# Patient Record
Sex: Female | Born: 1950
Health system: Southern US, Community
[De-identification: ages and names within clinical notes are randomized; demographics above are authoritative.]

## PROBLEM LIST (undated history)

## (undated) DIAGNOSIS — E049 Nontoxic goiter, unspecified: Secondary | ICD-10-CM

## (undated) DIAGNOSIS — E785 Hyperlipidemia, unspecified: Secondary | ICD-10-CM

## (undated) DIAGNOSIS — M179 Osteoarthritis of knee, unspecified: Secondary | ICD-10-CM

## (undated) DIAGNOSIS — H409 Unspecified glaucoma: Secondary | ICD-10-CM

## (undated) DIAGNOSIS — E119 Type 2 diabetes mellitus without complications: Secondary | ICD-10-CM

## (undated) DIAGNOSIS — M1710 Unilateral primary osteoarthritis, unspecified knee: Secondary | ICD-10-CM

## (undated) DIAGNOSIS — I10 Essential (primary) hypertension: Secondary | ICD-10-CM

## (undated) DIAGNOSIS — D249 Benign neoplasm of unspecified breast: Secondary | ICD-10-CM

## (undated) DIAGNOSIS — R011 Cardiac murmur, unspecified: Secondary | ICD-10-CM

## (undated) DIAGNOSIS — E039 Hypothyroidism, unspecified: Secondary | ICD-10-CM

## (undated) HISTORY — DX: Cardiac murmur, unspecified: R01.1

## (undated) HISTORY — DX: Unspecified glaucoma: H40.9

## (undated) HISTORY — DX: Benign neoplasm of unspecified breast: D24.9

## (undated) HISTORY — DX: Unilateral primary osteoarthritis, unspecified knee: M17.10

## (undated) HISTORY — DX: Nontoxic goiter, unspecified: E04.9

## (undated) HISTORY — DX: Essential (primary) hypertension: I10

## (undated) HISTORY — DX: Osteoarthritis of knee, unspecified: M17.9

## (undated) HISTORY — DX: Type 2 diabetes mellitus without complications: E11.9

## (undated) HISTORY — PX: CATARACT EXTRACTION W/ INTRAOCULAR LENS  IMPLANT, BILATERAL: SHX1307

## (undated) HISTORY — DX: Hyperlipidemia, unspecified: E78.5

## (undated) HISTORY — DX: Hypothyroidism, unspecified: E03.9

## (undated) HISTORY — PX: ABDOMINOPLASTY: SUR9

---

## 1998-05-05 DIAGNOSIS — Z5189 Encounter for other specified aftercare: Secondary | ICD-10-CM

## 1998-05-05 HISTORY — DX: Encounter for other specified aftercare: Z51.89

## 1999-02-14 ENCOUNTER — Encounter: Payer: Self-pay | Admitting: Obstetrics and Gynecology

## 1999-02-14 ENCOUNTER — Ambulatory Visit (HOSPITAL_COMMUNITY): Admission: RE | Admit: 1999-02-14 | Discharge: 1999-02-14 | Payer: Self-pay | Admitting: Obstetrics and Gynecology

## 1999-05-06 HISTORY — PX: ABDOMINAL HYSTERECTOMY: SHX81

## 1999-05-28 ENCOUNTER — Inpatient Hospital Stay (HOSPITAL_COMMUNITY): Admission: RE | Admit: 1999-05-28 | Discharge: 1999-06-02 | Payer: Self-pay | Admitting: Obstetrics and Gynecology

## 1999-05-28 ENCOUNTER — Encounter (INDEPENDENT_AMBULATORY_CARE_PROVIDER_SITE_OTHER): Payer: Self-pay | Admitting: Specialist

## 2001-02-22 ENCOUNTER — Encounter: Payer: Self-pay | Admitting: Family Medicine

## 2001-02-22 ENCOUNTER — Encounter: Admission: RE | Admit: 2001-02-22 | Discharge: 2001-02-22 | Payer: Self-pay | Admitting: Family Medicine

## 2001-03-15 ENCOUNTER — Ambulatory Visit (HOSPITAL_COMMUNITY): Admission: RE | Admit: 2001-03-15 | Discharge: 2001-03-15 | Payer: Self-pay | Admitting: *Deleted

## 2001-03-15 ENCOUNTER — Encounter (INDEPENDENT_AMBULATORY_CARE_PROVIDER_SITE_OTHER): Payer: Self-pay | Admitting: *Deleted

## 2001-11-12 ENCOUNTER — Encounter: Payer: Self-pay | Admitting: Family Medicine

## 2001-11-12 ENCOUNTER — Encounter: Admission: RE | Admit: 2001-11-12 | Discharge: 2001-11-12 | Payer: Self-pay | Admitting: Family Medicine

## 2002-08-01 ENCOUNTER — Ambulatory Visit (HOSPITAL_COMMUNITY): Admission: RE | Admit: 2002-08-01 | Discharge: 2002-08-01 | Payer: Self-pay | Admitting: Gastroenterology

## 2002-09-03 DIAGNOSIS — M722 Plantar fascial fibromatosis: Secondary | ICD-10-CM

## 2002-09-03 HISTORY — DX: Plantar fascial fibromatosis: M72.2

## 2003-03-14 ENCOUNTER — Encounter: Admission: RE | Admit: 2003-03-14 | Discharge: 2003-06-12 | Payer: Self-pay | Admitting: Family Medicine

## 2006-11-02 ENCOUNTER — Other Ambulatory Visit: Admission: RE | Admit: 2006-11-02 | Discharge: 2006-11-02 | Payer: Self-pay | Admitting: Obstetrics and Gynecology

## 2007-11-09 ENCOUNTER — Other Ambulatory Visit: Admission: RE | Admit: 2007-11-09 | Discharge: 2007-11-09 | Payer: Self-pay | Admitting: Obstetrics and Gynecology

## 2010-05-05 DIAGNOSIS — H269 Unspecified cataract: Secondary | ICD-10-CM

## 2010-05-05 HISTORY — PX: EYE SURGERY: SHX253

## 2010-05-05 HISTORY — DX: Unspecified cataract: H26.9

## 2010-09-20 NOTE — Op Note (Signed)
[facility]  Patient:    Robin Rice                     MRN: [id number] Proc. Date: 05/28/99 Adm. Date:  [id number] Attending:  Dierdre Forth Pearline                           Operative Report  PREOPERATIVE DIAGNOSES:       Symptomatic uterine fibroids; history of anemia; hypertension.  POSTOPERATIVE DIAGNOSES:      Symptomatic uterine fibroids; history of anemia; hypertension.  OPERATION:                    Total abdominal hysterectomy.  SURGEON:                      Vanessa P. Pennie Rushing, M.D.  FIRST ASSISTANT:              Elmira J. Powell, P.A.C.  ANESTHESIA:                   General orotracheal.  ESTIMATED BLOOD LOSS:         350 cc.  COMPLICATIONS:                The patient presented a difficult intubation but subsequently was successfully intubated by the anesthesia department.  FINDINGS:                     The uterus was enlarged to approximately 14-weeks-size with multiple irregularities.  The tubes and ovaries appeared within normal limits.  DESCRIPTION OF PROCEDURE:     The patient was taken to the operating room after  appropriate identification and placed on the operating table.  After the attainment of adequate general anesthesia, the abdomen was prepped with multiple layers of  Betadine, as were the perineum and vagina.  A Foley catheter was inserted into he bladder and connected to straight drainage.  The abdomen was draped as a sterile field.  A transverse incision was made along the outlined lines for the patients subsequent plastic surgical procedure and the abdomen opened in layers.  The peritoneum was entered and washing obtained, which were subsequently discarded.  Examination of the upper abdomen was within normal limits.  The self-retaining OConnor-OSullivan retractor was placed in the abdominal incision and a bladder blade placed.  The bowel was packed cephalad and an upper blade placed.   The uterus was grasped at the cornual region on either side with Kelly clamps and elevated  into the operative field.  The left round ligament was suture-ligated and incised and that incision taken anteriorly on the anterior leaf of the broad ligament. The uteroovarian ligament was then clamped, cut, tied with a free tie and suture-ligated; a similar procedure was carried out on the opposite side with the incisions on the broad ligaments meeting anteriorly.  The bladder was bluntly dissected off the anterior cervix.  The uterine vessels were skeletonized, then  clamped, cut and suture-ligated on the right and left sides.  The uterine fundus was excised and removed from the operative field.  The paracervical tissues were then successively clamped, cut and suture-ligated and at the level of the uterosacral ligaments, these sutures were held.  The vaginal angles were then clamped, cut and suture-ligated and the sutures held.  The remainder of the cervix was excised from the upper vagina.  The vaginal  cuff was then closed with figure-of-eight sutures of 0 Vicryl.  Care was taken at the angle sutures to place Richardson stitches to ensure that the vaginal angle had been oversewn. Hemostasis was achieved with figure-of-eight sutures in the vaginal cuff.  Copious irrigation was carried out and the sutures which held the vaginal angles and the sutures which held the uterosacral ligaments on either side were tied together.  Hemostasis was noted to be adequate and all instruments were removed from the operative field.  The abdominal peritoneum was closed with a running suture of 0 Vicryl.  The rectus muscles were irrigated and made hemostatic with Bovie cautery.  The fascia was closed with a running suture of 0 Vicryl from each apex to the midline, then tied in the midline.  At this time, the total abdominal hysterectomy was considered complete and Dr. Pleas Patricia, Montez Hageman. began the  abdominoplasty; this will be dictated under a separate operative report. DD:  05/28/99 TD:  05/29/99 Job: [id number]

## 2010-09-20 NOTE — Op Note (Signed)
NAME:  Robin Rice, ZANI                       ACCOUNT NO.:  [id number]   MEDICAL RECORD NO.:  [id number]                   PATIENT TYPE:  AMB   LOCATION:  ENDO                                 FACILITY:  [facility]   PHYSICIAN:  Anselmo Rod, M.D.               DATE OF BIRTH:  10-04-1950   DATE OF PROCEDURE:  08/01/2002  DATE OF DISCHARGE:                                 OPERATIVE REPORT   PROCEDURE PERFORMED:  Screening colonoscopy.   ENDOSCOPIST:  Charna Elizabeth, M.D.   INSTRUMENT USED:  Pediatric adjustable Olympus colonoscope.   INDICATIONS FOR PROCEDURE:  The patient is a 60 year old African-American  female undergoing screening colonoscopy.  Rule out colonic polyps, masses,  etc.   PREPROCEDURE PREPARATION:  Informed consent was procured from the patient.  The patient was fasted for eight hours prior to the procedure and prepped  with a bottle of MiraLax and Gatorade the night prior to the procedure.   PREPROCEDURE PHYSICAL:  The patient had stable vital signs.  Neck supple.  Chest clear to auscultation.  S1 and S2 regular.  Abdomen soft with normal  bowel sounds.   DESCRIPTION OF PROCEDURE:  The patient was placed in left lateral decubitus  position and sedated with 100 mg of Demerol and 10 mg of Versed  intravenously.  Once the patient was adequately sedated and maintained on  low flow oxygen and continuous cardiac monitoring, the Olympus video  colonoscope was advanced from the rectum to the cecum with difficulty.  There was some residual stool in the colon.  Multiple washes were done.  The  patient's position was changed from the left lateral to the supine position.  The appendicular orifice and ileocecal valve were visualized and  photographed but the pictures were lost due to technical problems.  The rest  of the colonic mucosa including the right colon, transverse colon, rectum  appeared normal.  Small lesions could have been missed secondary to a  relatively poor  prep.  Small nonbleeding internal hemorrhoids were seen on  retroflexion in the rectum.   IMPRESSION:  1. Normal colonoscopy except for small nonbleeding internal hemorrhoids.  2. No masses or polyps seen.  3. Some residual stool in the colon, small lesions could have been missed.    RECOMMENDATIONS:  1. Repeat colorectal cancer screening is recommended for the patient in the     next five years unless the patient develops any abdominal symptoms in the     interim.  2. Outpatient follow-up on a p.r.n. basis.                                                   Anselmo Rod, M.D.    JNM/MEDQ  D:  08/01/2002  T:  08/01/2002  Job:  [id number]   cc:   Marjory Lies, M.D.  [address]  [place]  [place] [address]  Fax: [phone]

## 2010-09-20 NOTE — Op Note (Signed)
[facility]  Patient:    Robin Rice                     MRN: [id number] Proc. Date: 05/28/99 Adm. Date:  [id number] Attending:  Dierdre Forth Pearline                           Operative Report  PREOPERATIVE DIAGNOSIS:       Abdominal elastosis.  POSTOPERATIVE DIAGNOSIS:      Abdominal elastosis.  OPERATION:                    Abdominoplasty.  SURGEON:                      Consuello Bossier., M.D.  ASSISTANT:  ANESTHESIA:                   General endotracheal anesthesia.  ESTIMATED BLOOD LOSS:  FINDINGS:                     Please see Vanessa P. Haygood, M.D.s operative note for her abdominal hysterectomy.  I talked with the patient about what would be involved with an abdominoplasty, trying to remove as much of the lower abdominal doughy skin that was present.  She had been marked for the procedure and the ellipse that had been marked off was able to be removed.  DESCRIPTION OF PROCEDURE:     The patient was brought to the operating room having had a general endotracheal anesthetic and then her abdominal hysterectomy by Maris Berger. Pennie Rushing, M.D.  The fascia had been closed.  At this point, the dissection was continued at the level of the fascia with electrocautery unit up to the umbilicus and then a separate incision made around the umbilicus.  It should be mentioned that the previous incision which had been marked off was extended laterally on both side.  The dissection was continued then above the level of the umbilicus up toward the sternum.  Also dissection was continued upward inferiorly somewhat freeing up the lower flap.  Bleeding was controlled with electrocautery unit and there was noted to be good hemostasis.  There was a slight stasis recti superiorly, but it was not felt large enough to require repair.  At this point, the patient was placed in somewhat of a jackknife position and two key sutures  of interrupted 2-0 Vicryl were placed in the midline.  A Chevron shaped incision was then made in the abdominal flap overlying the umbilicus and the umbilicus brought into place and sewn with subcutaneous 4-0 Vicryl followed by interrupted 5-0 Prolene.  The large triangular segments of lower abdominal full-thickness tissue were then removed and again bleeding was controlled with electrocautery unit. wo 10 mm Blake drains were placed on each side of the umbilicus and brought out through separate stab wounds.  At this point the lower abdominal skin was closed with interrupted 3-0 Vicryl followed by a running subcuticular 4-0 Monocryl. Steri-Strips, Xeroform, 4 x 8, and a Hypafix dressing were then applied.  The patient tolerated the procedure well and was able to be discharged from the operating room to the recovery room in satisfactory condition. DD:  05/28/99 TD:  05/29/99 Job: [id number]

## 2010-09-20 NOTE — Discharge Summary (Signed)
[facility]  Patient:    Robin Rice                     MRN: [id number] Adm. Date:  [id number] Disc. Date: [id number] Attending:  Shaune Spittle Dictator:   Marquis Lunch. Powell, P.A.C.                           Discharge Summary  DISCHARGE DIAGNOSES:          1. Symptomatic uterine fibroids.                               2. History of anemia.                               3. Hypertension.  PROCEDURES:                   On the date of admission, patient underwent a total                               abdominal hysterectomy by Dr. Maris Berger. Haygood.  This procedure was followed by an abdominoplasty by                               Dr. Pleas Patricia, Montez Hageman., along with the placement                               of two Blake drains.  Prior to discharge, patient                               also underwent an evacuation of an abdominal wound                               hematoma following previous abdominal hysterectomy                               and abdominoplasty.  HISTORY OF PRESENT ILLNESS:   This is a 60 year old married African-American female, para 2-0-0-2, with a longstanding history of symptomatic uterine fibroids resulting in menorrhagia and subsequent anemia.  Please see patients dictated history and physical exam for details.  PHYSICAL EXAMINATION:         Blood pressure was 120/78.  Weight was 180-1/2 pounds.  General exam was within normal limits with the exception of abdominal xam which revealed a palpable mass arising from the pelvis in the midabdominal area  between umbilicus and symphysis pubis.  Pelvic exam:  EG/BUS within normal limits. Uterus 16- to 18-weeks-size.  Adnexa without tenderness or masses. Rectovaginal confirms.  HOSPITAL COURSE:              On date of admission, patient underwent a total abdominal hysterectomy and abdominoplasty, tolerating both procedures well. Patients preop hemoglobin  was 14 (postop hemoglobin 7).  Patients postoperative  course was complicated by a fever on postop day #2 which was managed by IV antibiotics.  Patient continued to remain febrile  on postop day #3, at which time she was taken again to the operating room by Dr. Stephens November for the evacuation f an abdominal hematoma.  Following this procedure, patients fever defervesced, she resumed bowel and bladder function and was felt to be ready for discharge on postop day #5.  DISCHARGE MEDICATIONS:        1. Tylox one tablet every six hours as needed for                                  pain.                               2. Ferrous sulfate 325 mg one tablet three times  day.  FOLLOWUP:                     Patient is to call [facility] and Gynecology for an appointment with Dr. Pennie Rushing in six weeks for postoperative evaluation; she also was encouraged to call for any questions or concerns. Patient has a followup appointment with Dr. Stephens November on June 03, 1999 for drain management.  DISCHARGE INSTRUCTIONS:       Patient given a copy of [facility] postoperative instruction sheet.  She was advised to avoid driving for two weeks, heavy lifting for four weeks, intercourse for six weeks. Patient is also to receive separate instructions from Dr. Stephens November.  FINAL PATHOLOGY:              1. Uterus with cervix:  Cervicitis and squamous                                  metaplasia.  No dysplasia identified.                                  Proliferative endometrium.  No evidence of                                  hyperplasia or malignancy identified. Multiple                                  leiomyomata, submucosal, intramural and                                  subserosal.  No malignancy identified.                               2. Abdominal tissue:  Benign skin and adipose tissue. DD:  06/17/99 TD:  06/18/99 Job: [id number]

## 2010-09-20 NOTE — Discharge Summary (Signed)
[facility]  Patient:    Robin Rice                     MRN: [id number] Adm. Date:  [id number] Disc. Date: [id number] Attending:  Shaune Spittle Dictator:   Henreitta Leber, P.A.                           Discharge Summary  ADMISSION DIAGNOSES:          1. Symptomatic uterine fibroids.                               2. History of anemia.                               3. Hypertension.  DISCHARGE DIAGNOSES:          1. Symptomatic uterine fibroids.                               2. History of anemia.                               3. Hypertension.  DISCHARGE INSTRUCTIONS:       The patient was given a copy of [facility] postoperative instruction sheet.  She was advised to avoid driving for two weeks, heavy lifting for four weeks, intercourse for six weeks.  DISCHARGE MEDICATIONS:        1. Tylox one tablet every six hours as needed for                                  pain.                               2. Ferrous sulfate 325 mg one tablet three times  day for eight weeks.  FOLLOW-UP:                    The patient is scheduled to call [facility] and [facility]necology and schedule a postoperative follow-up with Vanessa P. Haygood, M.D. in six weeks.  HOSPITAL COURSE:              On date of admission, the patient underwent a DD:  06/17/99 TD:  06/17/99 Job: [id number]

## 2010-10-22 ENCOUNTER — Other Ambulatory Visit: Payer: Self-pay

## 2012-12-02 ENCOUNTER — Ambulatory Visit: Payer: Self-pay | Admitting: Certified Nurse Midwife

## 2012-12-07 ENCOUNTER — Encounter: Payer: Self-pay | Admitting: Certified Nurse Midwife

## 2012-12-10 ENCOUNTER — Encounter: Payer: Self-pay | Admitting: Certified Nurse Midwife

## 2012-12-10 ENCOUNTER — Ambulatory Visit (INDEPENDENT_AMBULATORY_CARE_PROVIDER_SITE_OTHER): Payer: PRIVATE HEALTH INSURANCE | Admitting: Certified Nurse Midwife

## 2012-12-10 VITALS — BP 126/80 | HR 60 | Resp 16 | Ht 64.25 in | Wt 184.0 lb

## 2012-12-10 DIAGNOSIS — Z01419 Encounter for gynecological examination (general) (routine) without abnormal findings: Secondary | ICD-10-CM

## 2012-12-10 DIAGNOSIS — Z0000 Encounter for general adult medical examination without abnormal findings: Secondary | ICD-10-CM

## 2012-12-10 LAB — POCT URINALYSIS DIPSTICK
Blood, UA: NEGATIVE
Nitrite, UA: NEGATIVE
Protein, UA: NEGATIVE
Urobilinogen, UA: NEGATIVE

## 2012-12-10 NOTE — Patient Instructions (Signed)

## 2012-12-10 NOTE — Progress Notes (Signed)
62 y.o. Robin Rice Married African American Fe here for annual exam. Menopausal, no HRT. Denies vaginal bleeding or vaginal dryness. Sees PCP for thyroid, cholesterol and hypertension management, labs, aex.  No health issues today.  Patient's last menstrual period was 05/06/1999.          Sexually active: yes  The current method of family planning is status post hysterectomy.    Exercising: yes  walking Smoker:  no  Health Maintenance: Pap: 11/13/08 neg MMG:  7/14 normal Colonoscopy:  2004 BMD:   6/13 normal TDaP:  Unsure, done at pcp up to date Labs: Poct urine-neg Self breast exam: done occ   reports that she has never smoked. She does not have any smokeless tobacco history on file. She reports that she does not drink alcohol or use illicit drugs.  Past Medical History  Diagnosis Date  . Hypertension   . Hyperlipidemia   . Thyroid disease     hypothyroid, goiter  . Glaucoma     left eye  . Fibroadenoma     Past Surgical History  Procedure Laterality Date  . Abdominal hysterectomy  2001    TAH, menorrhagia  . Abdominoplasty    . Cataract surgery      Current Outpatient Prescriptions  Medication Sig Dispense Refill  . aspirin 81 MG tablet Take 81 mg by mouth daily.      Marland Kitchen atorvastatin (LIPITOR) 40 MG tablet Take 1/2 tablet daily      . ergocalciferol (VITAMIN D2) 50000 UNITS capsule Take 50,000 Units by mouth every 14 (fourteen) days.       Marland Kitchen levothyroxine (SYNTHROID, LEVOTHROID) 50 MCG tablet Take 50 mcg by mouth daily before breakfast.      . lisinopril-hydrochlorothiazide (PRINZIDE,ZESTORETIC) 20-25 MG per tablet Take 1 tablet by mouth daily.       No current facility-administered medications for this visit.    Family History  Problem Relation Age of Onset  . Heart disease Mother   . Hypertension Father   . Hypertension Brother   . Hypertension Brother     ROS:  Pertinent items are noted in HPI.  Otherwise, a comprehensive ROS was negative.  Exam:   BP  126/80  Pulse 60  Resp 16  Ht 5' 4.25" (1.632 m)  Wt 184 lb (83.462 kg)  BMI 31.34 kg/m2  LMP 05/06/1999 Height: 5' 4.25" (163.2 cm)  Ht Readings from Last 3 Encounters:  12/10/12 5' 4.25" (1.632 m)    General appearance: alert, cooperative and appears stated age Head: Normocephalic, without obvious abnormality, atraumatic Neck: no adenopathy, supple, symmetrical, trachea midline and thyroid normal to inspection and palpation Lungs: clear to auscultation bilaterally Breasts: normal appearance, no masses or tenderness, No nipple retraction or dimpling, No nipple discharge or bleeding, No axillary or supraclavicular adenopathy Heart: regular rate and rhythm Abdomen: soft, non-tender; no masses,  no organomegaly Extremities: extremities normal, atraumatic, no cyanosis or edema Skin: Skin color, texture, turgor normal. No rashes or lesions Lymph nodes: Cervical, supraclavicular, and axillary nodes normal. No abnormal inguinal nodes palpated Neurologic: Grossly normal   Pelvic: External genitalia:  no lesions              Urethra:  normal appearing urethra with no masses, tenderness or lesions              Bartholin's and Skene's: normal                 Vagina: normal appearing vagina with normal color and discharge,  no lesions              Cervix: absent              Pap taken: no Bimanual Exam:  Uterus:  uterus absent              Adnexa: no mass, fullness, tenderness               Rectovaginal: Confirms               Anus:  normal sphincter tone, no lesions  A:  Well Woman with normal exam  Menopausal no HRT S/PTAH due to bleeding ovaries retained  Hypothyroid, Cholesterol and Hypertension stable with medication PCP management   P:   Reviewed health and wellness pertinent to exam  Continue follow up as indicated  Pap smear as per guidelines   Mammogram yearly pap smear not taken today  counseled on breast self exam, mammography screening, menopause, adequate intake of  calcium and vitamin D, diet and exercise  return annually or prn  An After Visit Summary was printed and given to the patient.

## 2012-12-11 NOTE — Progress Notes (Signed)
Note reviewed, agree with plan.  Dezerae Freiberger, MD  

## 2013-03-29 ENCOUNTER — Ambulatory Visit (INDEPENDENT_AMBULATORY_CARE_PROVIDER_SITE_OTHER): Payer: PRIVATE HEALTH INSURANCE

## 2013-03-29 ENCOUNTER — Ambulatory Visit (INDEPENDENT_AMBULATORY_CARE_PROVIDER_SITE_OTHER): Payer: PRIVATE HEALTH INSURANCE | Admitting: Podiatry

## 2013-03-29 ENCOUNTER — Encounter: Payer: Self-pay | Admitting: Podiatry

## 2013-03-29 VITALS — BP 158/86 | HR 91 | Resp 16 | Ht 65.0 in | Wt 187.0 lb

## 2013-03-29 DIAGNOSIS — M79609 Pain in unspecified limb: Secondary | ICD-10-CM

## 2013-03-29 DIAGNOSIS — M722 Plantar fascial fibromatosis: Secondary | ICD-10-CM

## 2013-03-29 DIAGNOSIS — M79671 Pain in right foot: Secondary | ICD-10-CM

## 2013-03-29 MED ORDER — MELOXICAM 15 MG PO TABS: 15.0000 mg | ORAL_TABLET | Freq: Every day | ORAL | Status: DC

## 2013-03-29 NOTE — Progress Notes (Signed)
Ms. Bibian presents today as a 62 year old black female with a chief complaint of a right heel pain x6 months. She states that she's an avid walker and is having problems walking and has as of late not been walking at all. She states that she needs to walk control her weight and her blood sugar. He does have a history of non-insulin-dependent diabetes mellitus.  Objective: Vital signs are stable she is alert and oriented x3. I have reviewed her past medical history medications and allergies. Pulses are strongly palpable bilateral. Neurologic sensorium is intact per Semmes-Weinstein monofilament. Deep tendon reflexes are intact bilateral. Muscle strength 5 over 5 dorsiflexors plantar flexors inverters and everters all intrinsic musculature is intact. Orthopedic evaluation demonstrates all joints distal to the ankle have a full range of motion without pain. She has pain on palpation medial calcaneal tubercle of the right heel. Radiographic evaluation demonstrates soft tissue increase in density at the plantar fascial calcaneal insertion site of the right heel. This is consistent with plantar fasciitis. Cutaneous evaluation demonstrates supple well hydrated cutis no erythema edema saline is drainage or odor.  Assessment: Plantar fasciitis right.  Plan: First injection to her right heel today. Plantar fascial strapping applied. Night splint dispensed. Prescription for Mobic called in. Discussed appropriate shoe gear stretching exercises and ice therapy. Instructions were given. And I will followup with her in one month. We did discuss a possible need for orthotics.

## 2013-03-29 NOTE — Patient Instructions (Signed)
Plantar Fasciitis (Heel Spur Syndrome) with Rehab The plantar fascia is a fibrous, ligament-like, soft-tissue structure that spans the bottom of the foot. Plantar fasciitis is a condition that causes pain in the foot due to inflammation of the tissue. SYMPTOMS   Pain and tenderness on the underneath side of the foot.  Pain that worsens with standing or walking. CAUSES  Plantar fasciitis is caused by irritation and injury to the plantar fascia on the underneath side of the foot. Common mechanisms of injury include:  Direct trauma to bottom of the foot.  Damage to a small nerve that runs under the foot where the main fascia attaches to the heel bone.  Stress placed on the plantar fascia due to bone spurs. RISK INCREASES WITH:   Activities that place stress on the plantar fascia (running, jumping, pivoting, or cutting).  Poor strength and flexibility.  Improperly fitted shoes.  Tight calf muscles.  Flat feet.  Failure to warm-up properly before activity.  Obesity. PREVENTION  Warm up and stretch properly before activity.  Allow for adequate recovery between workouts.  Maintain physical fitness:  Strength, flexibility, and endurance.  Cardiovascular fitness.  Maintain a health body weight.  Avoid stress on the plantar fascia.  Wear properly fitted shoes, including arch supports for individuals who have flat feet. PROGNOSIS  If treated properly, then the symptoms of plantar fasciitis usually resolve without surgery. However, occasionally surgery is necessary. RELATED COMPLICATIONS   Recurrent symptoms that may result in a chronic condition.  Problems of the lower back that are caused by compensating for the injury, such as limping.  Pain or weakness of the foot during push-off following surgery.  Chronic inflammation, scarring, and partial or complete fascia tear, occurring more often from repeated injections. TREATMENT  Treatment initially involves the use of  ice and medication to help reduce pain and inflammation. The use of strengthening and stretching exercises may help reduce pain with activity, especially stretches of the Achilles tendon. These exercises may be performed at home or with a therapist. Your caregiver may recommend that you use heel cups of arch supports to help reduce stress on the plantar fascia. Occasionally, corticosteroid injections are given to reduce inflammation. If symptoms persist for greater than 6 months despite non-surgical (conservative), then surgery may be recommended.  MEDICATION   If pain medication is necessary, then nonsteroidal anti-inflammatory medications, such as aspirin and ibuprofen, or other minor pain relievers, such as acetaminophen, are often recommended.  Do not take pain medication within 7 days before surgery.  Prescription pain relievers may be given if deemed necessary by your caregiver. Use only as directed and only as much as you need.  Corticosteroid injections may be given by your caregiver. These injections should be reserved for the most serious cases, because they may only be given a certain number of times. HEAT AND COLD  Cold treatment (icing) relieves pain and reduces inflammation. Cold treatment should be applied for 10 to 15 minutes every 2 to 3 hours for inflammation and pain and immediately after any activity that aggravates your symptoms. Use ice packs or massage the area with a piece of ice (ice massage).  Heat treatment may be used prior to performing the stretching and strengthening activities prescribed by your caregiver, physical therapist, or athletic trainer. Use a heat pack or soak the injury in warm water. SEEK IMMEDIATE MEDICAL CARE IF:  Treatment seems to offer no benefit, or the condition worsens.  Any medications produce adverse side effects. EXERCISES RANGE   OF MOTION (ROM) AND STRETCHING EXERCISES - Plantar Fasciitis (Heel Spur Syndrome) These exercises may help you  when beginning to rehabilitate your injury. Your symptoms may resolve with or without further involvement from your physician, physical therapist or athletic trainer. While completing these exercises, remember:   Restoring tissue flexibility helps normal motion to return to the joints. This allows healthier, less painful movement and activity.  An effective stretch should be held for at least 30 seconds.  A stretch should never be painful. You should only feel a gentle lengthening or release in the stretched tissue. RANGE OF MOTION - Toe Extension, Flexion  Sit with your right / left leg crossed over your opposite knee.  Grasp your toes and gently pull them back toward the top of your foot. You should feel a stretch on the bottom of your toes and/or foot.  Hold this stretch for __________ seconds.  Now, gently pull your toes toward the bottom of your foot. You should feel a stretch on the top of your toes and or foot.  Hold this stretch for __________ seconds. Repeat __________ times. Complete this stretch __________ times per day.  RANGE OF MOTION - Ankle Dorsiflexion, Active Assisted  Remove shoes and sit on a chair that is preferably not on a carpeted surface.  Place right / left foot under knee. Extend your opposite leg for support.  Keeping your heel down, slide your right / left foot back toward the chair until you feel a stretch at your ankle or calf. If you do not feel a stretch, slide your bottom forward to the edge of the chair, while still keeping your heel down.  Hold this stretch for __________ seconds. Repeat __________ times. Complete this stretch __________ times per day.  STRETCH  Gastroc, Standing  Place hands on wall.  Extend right / left leg, keeping the front knee somewhat bent.  Slightly point your toes inward on your back foot.  Keeping your right / left heel on the floor and your knee straight, shift your weight toward the wall, not allowing your back to  arch.  You should feel a gentle stretch in the right / left calf. Hold this position for __________ seconds. Repeat __________ times. Complete this stretch __________ times per day. STRETCH  Soleus, Standing  Place hands on wall.  Extend right / left leg, keeping the other knee somewhat bent.  Slightly point your toes inward on your back foot.  Keep your right / left heel on the floor, bend your back knee, and slightly shift your weight over the back leg so that you feel a gentle stretch deep in your back calf.  Hold this position for __________ seconds. Repeat __________ times. Complete this stretch __________ times per day. STRETCH  Gastrocsoleus, Standing  Note: This exercise can place a lot of stress on your foot and ankle. Please complete this exercise only if specifically instructed by your caregiver.   Place the ball of your right / left foot on a step, keeping your other foot firmly on the same step.  Hold on to the wall or a rail for balance.  Slowly lift your other foot, allowing your body weight to press your heel down over the edge of the step.  You should feel a stretch in your right / left calf.  Hold this position for __________ seconds.  Repeat this exercise with a slight bend in your right / left knee. Repeat __________ times. Complete this stretch __________ times per day.    STRENGTHENING EXERCISES - Plantar Fasciitis (Heel Spur Syndrome)  These exercises may help you when beginning to rehabilitate your injury. They may resolve your symptoms with or without further involvement from your physician, physical therapist or athletic trainer. While completing these exercises, remember:   Muscles can gain both the endurance and the strength needed for everyday activities through controlled exercises.  Complete these exercises as instructed by your physician, physical therapist or athletic trainer. Progress the resistance and repetitions only as guided. STRENGTH - Towel  Curls  Sit in a chair positioned on a non-carpeted surface.  Place your foot on a towel, keeping your heel on the floor.  Pull the towel toward your heel by only curling your toes. Keep your heel on the floor.  If instructed by your physician, physical therapist or athletic trainer, add ____________________ at the end of the towel. Repeat __________ times. Complete this exercise __________ times per day. STRENGTH - Ankle Inversion  Secure one end of a rubber exercise band/tubing to a fixed object (table, pole). Loop the other end around your foot just before your toes.  Place your fists between your knees. This will focus your strengthening at your ankle.  Slowly, pull your big toe up and in, making sure the band/tubing is positioned to resist the entire motion.  Hold this position for __________ seconds.  Have your muscles resist the band/tubing as it slowly pulls your foot back to the starting position. Repeat __________ times. Complete this exercises __________ times per day.  Document Released: 04/21/2005 Document Revised: 07/14/2011 Document Reviewed: 08/03/2008 [employer] Patient Information 2014 [name]. Plantar Fasciitis Plantar fasciitis is a common condition that causes foot pain. It is soreness (inflammation) of the band of tough fibrous tissue on the bottom of the foot that runs from the heel bone (calcaneus) to the ball of the foot. The cause of this soreness may be from excessive standing, poor fitting shoes, running on hard surfaces, being overweight, having an abnormal walk, or overuse (this is common in runners) of the painful foot or feet. It is also common in aerobic exercise dancers and ballet dancers. SYMPTOMS  Most people with plantar fasciitis complain of:  Severe pain in the morning on the bottom of their foot especially when taking the first steps out of bed. This pain recedes after a few minutes of walking.  Severe pain is experienced also during walking  following a long period of inactivity.  Pain is worse when walking barefoot or up stairs DIAGNOSIS   Your caregiver will diagnose this condition by examining and feeling your foot.  Special tests such as X-rays of your foot, are usually not needed. PREVENTION   Consult a sports medicine professional before beginning a new exercise program.  Walking programs offer a good workout. With walking there is a lower chance of overuse injuries common to runners. There is less impact and less jarring of the joints.  Begin all new exercise programs slowly. If problems or pain develop, decrease the amount of time or distance until you are at a comfortable level.  Wear good shoes and replace them regularly.  Stretch your foot and the heel cords at the back of the ankle (Achilles tendon) both before and after exercise.  Run or exercise on even surfaces that are not hard. For example, asphalt is better than pavement.  Do not run barefoot on hard surfaces.  If using a treadmill, vary the incline.  Do not continue to workout if you have foot or joint   problems. Seek professional help if they do not improve. HOME CARE INSTRUCTIONS   Avoid activities that cause you pain until you recover.  Use ice or cold packs on the problem or painful areas after working out.  Only take over-the-counter or prescription medicines for pain, discomfort, or fever as directed by your caregiver.  Soft shoe inserts or athletic shoes with air or gel sole cushions may be helpful.  If problems continue or become more severe, consult a sports medicine caregiver or your own health care provider. Cortisone is a potent anti-inflammatory medication that may be injected into the painful area. You can discuss this treatment with your caregiver. MAKE SURE YOU:   Understand these instructions.  Will watch your condition.  Will get help right away if you are not doing well or get worse. Document Released: 01/14/2001 Document  Revised: 07/14/2011 Document Reviewed: 03/15/2008 [employer] Patient Information 2014 [name].  

## 2013-04-26 ENCOUNTER — Encounter: Payer: Self-pay | Admitting: Podiatry

## 2013-04-26 ENCOUNTER — Ambulatory Visit (INDEPENDENT_AMBULATORY_CARE_PROVIDER_SITE_OTHER): Payer: PRIVATE HEALTH INSURANCE | Admitting: Podiatry

## 2013-04-26 VITALS — BP 149/78 | HR 92 | Resp 16 | Ht 64.0 in | Wt 183.0 lb

## 2013-04-26 DIAGNOSIS — M722 Plantar fascial fibromatosis: Secondary | ICD-10-CM

## 2013-04-26 NOTE — Progress Notes (Signed)
She presents today for followup of plantar fasciitis right foot states it is better but is not 100%.  Objective: Pulses remain palpable right there is no calf pain. She has pain on palpation medial painful calcaneal tubercle right.  Assessment: Plantar fasciitis resolving in nature right foot.  Plan: Reinjected right foot today continue all conservative therapies followup with me in one month which time orthotics may be require.

## 2013-05-24 ENCOUNTER — Ambulatory Visit: Payer: PRIVATE HEALTH INSURANCE | Admitting: Podiatry

## 2013-12-15 ENCOUNTER — Ambulatory Visit (INDEPENDENT_AMBULATORY_CARE_PROVIDER_SITE_OTHER): Payer: PRIVATE HEALTH INSURANCE | Admitting: Certified Nurse Midwife

## 2013-12-15 ENCOUNTER — Encounter: Payer: Self-pay | Admitting: Certified Nurse Midwife

## 2013-12-15 VITALS — BP 124/76 | HR 70 | Resp 16 | Ht 64.75 in | Wt 194.0 lb

## 2013-12-15 DIAGNOSIS — Z01419 Encounter for gynecological examination (general) (routine) without abnormal findings: Secondary | ICD-10-CM

## 2013-12-15 DIAGNOSIS — Z0000 Encounter for general adult medical examination without abnormal findings: Secondary | ICD-10-CM

## 2013-12-15 LAB — POCT URINALYSIS DIPSTICK
BILIRUBIN UA: NEGATIVE
Blood, UA: NEGATIVE
GLUCOSE UA: NEGATIVE
Ketones, UA: NEGATIVE
LEUKOCYTES UA: NEGATIVE
NITRITE UA: NEGATIVE
Protein, UA: NEGATIVE
Urobilinogen, UA: NEGATIVE
pH, UA: 5

## 2013-12-15 NOTE — Patient Instructions (Signed)

## 2013-12-15 NOTE — Progress Notes (Signed)
63 y.o. [id number] Married African American Fe here for annual exam. Menopausal no HRT. Denies vaginal bleeding or night sweats. Has seen a PCP for aex/labs and management for Hypertension/Hypothyroid/Cholesterol. "Had a good year". No health issues today.  Patient's last menstrual period was 05/06/1999.          Sexually active: Yes.    The current method of family planning is status post hysterectomy.    Exercising: Yes.    walking Smoker:  no  Health Maintenance: Pap: 11-13-08 neg MMG:  11-25-13 breast composition category b, neg Colonoscopy:  2014 BMD:   6/13 TDaP:  Does with pcp Labs: Poct urine-neg Self breast exam: done occ   reports that she has never smoked. She does not have any smokeless tobacco history on file. She reports that she does not drink alcohol or use illicit drugs.  Past Medical History  Diagnosis Date  . Hypertension   . Hyperlipidemia   . Thyroid disease     hypothyroid, goiter  . Glaucoma     left eye  . Fibroadenoma     Past Surgical History  Procedure Laterality Date  . Abdominal hysterectomy  2001    TAH, menorrhagia  . Abdominoplasty    . Cataract surgery      Current Outpatient Prescriptions  Medication Sig Dispense Refill  . amLODipine (NORVASC) 5 MG tablet daily.      Marland Kitchen aspirin 81 MG tablet Take 81 mg by mouth daily.      Marland Kitchen atorvastatin (LIPITOR) 40 MG tablet Take 1/2 tablet daily      . Cholecalciferol (VITAMIN D PO) Take by mouth daily.      Marland Kitchen levothyroxine (SYNTHROID, LEVOTHROID) 50 MCG tablet Take 50 mcg by mouth daily before breakfast.      . lisinopril-hydrochlorothiazide (PRINZIDE,ZESTORETIC) 20-25 MG per tablet Take 1 tablet by mouth daily.      . meloxicam (MOBIC) 15 MG tablet Take 1 tablet (15 mg total) by mouth daily.  30 tablet  3   No current facility-administered medications for this visit.    Family History  Problem Relation Age of Onset  . Heart disease Mother   . Hypertension Father   . Hypertension Brother   .  Hypertension Brother     ROS:  Pertinent items are noted in HPI.  Otherwise, a comprehensive ROS was negative.  Exam:   BP 124/76  Pulse 70  Resp 16  Ht 5' 4.75" (1.645 m)  Wt 194 lb (87.998 kg)  BMI 32.52 kg/m2  LMP 05/06/1999 Height: 5' 4.75" (164.5 cm)  Ht Readings from Last 3 Encounters:  12/15/13 5' 4.75" (1.645 m)  04/26/13 5\' 4"  (1.626 m)  03/29/13 5\' 5"  (1.651 m)    General appearance: alert, cooperative and appears stated age Head: Normocephalic, without obvious abnormality, atraumatic Neck: no adenopathy, supple, symmetrical, trachea midline and thyroid normal to inspection and palpation and non-palpable Lungs: clear to auscultation bilaterally Breasts: normal appearance, no masses or tenderness, No nipple retraction or dimpling, No nipple discharge or bleeding, No axillary or supraclavicular adenopathy Heart: regular rate and rhythm Abdomen: soft, non-tender; no masses,  no organomegaly Extremities: extremities normal, atraumatic, no cyanosis or edema Skin: Skin color, texture, turgor normal. No rashes or lesions Lymph nodes: Cervical, supraclavicular, and axillary nodes normal. No abnormal inguinal nodes palpated Neurologic: Grossly normal   Pelvic: External genitalia:  no lesions              Urethra:  normal appearing urethra with no masses,  tenderness or lesions              Bartholin's and Skene's: normal                 Vagina: normal appearing vagina with normal color and discharge, no lesions              Cervix: absent              Pap taken: No. Bimanual Exam:  Uterus:  uterus absent              Adnexa: normal adnexa and no mass, fullness, tenderness               Rectovaginal: Confirms               Anus:  normal sphincter tone, no lesions  A:  Well Woman with normal exam  Menopausal no HRT Hypertension/Hypothyroid/Elevated cholesterol with PCP management   P:   Reviewed health and wellness pertinent to exam  Pap smear not  taken  today  Continue follow up as indicated   counseled on breast self exam, mammography screening, adequate intake of calcium and vitamin D, diet and exercise  return annually or prn  An After Visit Summary was printed and given to the patient.

## 2013-12-16 NOTE — Progress Notes (Signed)
Reviewed personally.  M. Suzanne Terita Hejl, MD.  

## 2014-03-06 ENCOUNTER — Encounter: Payer: Self-pay | Admitting: Certified Nurse Midwife

## 2014-11-14 ENCOUNTER — Telehealth: Payer: Self-pay | Admitting: Obstetrics and Gynecology

## 2014-11-14 NOTE — Telephone Encounter (Signed)
Left message regarding upcoming appointment has been canceled and needs to be rescheduled. °

## 2014-12-20 ENCOUNTER — Ambulatory Visit: Payer: PRIVATE HEALTH INSURANCE | Admitting: Obstetrics and Gynecology

## 2014-12-25 ENCOUNTER — Encounter: Payer: Self-pay | Admitting: Obstetrics and Gynecology

## 2014-12-25 ENCOUNTER — Ambulatory Visit (INDEPENDENT_AMBULATORY_CARE_PROVIDER_SITE_OTHER): Payer: PRIVATE HEALTH INSURANCE | Admitting: Obstetrics and Gynecology

## 2014-12-25 VITALS — BP 118/84 | HR 104 | Resp 16 | Ht 64.5 in | Wt 195.0 lb

## 2014-12-25 DIAGNOSIS — E1159 Type 2 diabetes mellitus with other circulatory complications: Secondary | ICD-10-CM | POA: Insufficient documentation

## 2014-12-25 DIAGNOSIS — H409 Unspecified glaucoma: Secondary | ICD-10-CM | POA: Diagnosis not present

## 2014-12-25 DIAGNOSIS — R635 Abnormal weight gain: Secondary | ICD-10-CM

## 2014-12-25 DIAGNOSIS — Z01419 Encounter for gynecological examination (general) (routine) without abnormal findings: Secondary | ICD-10-CM | POA: Diagnosis not present

## 2014-12-25 DIAGNOSIS — R5382 Chronic fatigue, unspecified: Secondary | ICD-10-CM | POA: Diagnosis not present

## 2014-12-25 DIAGNOSIS — Z0000 Encounter for general adult medical examination without abnormal findings: Secondary | ICD-10-CM | POA: Diagnosis not present

## 2014-12-25 DIAGNOSIS — R6882 Decreased libido: Secondary | ICD-10-CM

## 2014-12-25 DIAGNOSIS — I10 Essential (primary) hypertension: Secondary | ICD-10-CM | POA: Diagnosis not present

## 2014-12-25 DIAGNOSIS — L680 Hirsutism: Secondary | ICD-10-CM

## 2014-12-25 DIAGNOSIS — E079 Disorder of thyroid, unspecified: Secondary | ICD-10-CM | POA: Diagnosis not present

## 2014-12-25 DIAGNOSIS — E785 Hyperlipidemia, unspecified: Secondary | ICD-10-CM | POA: Diagnosis not present

## 2014-12-25 DIAGNOSIS — E1169 Type 2 diabetes mellitus with other specified complication: Secondary | ICD-10-CM | POA: Insufficient documentation

## 2014-12-25 LAB — TSH: TSH: 0.875 u[IU]/mL (ref 0.350–4.500)

## 2014-12-25 LAB — COMPREHENSIVE METABOLIC PANEL
ALK PHOS: 96 U/L (ref 33–130)
ALT: 16 U/L (ref 6–29)
AST: 19 U/L (ref 10–35)
Albumin: 4.6 g/dL (ref 3.6–5.1)
BILIRUBIN TOTAL: 0.6 mg/dL (ref 0.2–1.2)
BUN: 17 mg/dL (ref 7–25)
CALCIUM: 9.7 mg/dL (ref 8.6–10.4)
CO2: 27 mmol/L (ref 20–31)
CREATININE: 1.06 mg/dL — AB (ref 0.50–0.99)
Chloride: 100 mmol/L (ref 98–110)
GLUCOSE: 99 mg/dL (ref 65–99)
Potassium: 3.7 mmol/L (ref 3.5–5.3)
SODIUM: 142 mmol/L (ref 135–146)
Total Protein: 7.9 g/dL (ref 6.1–8.1)

## 2014-12-25 LAB — CBC
HCT: 41 % (ref 36.0–46.0)
Hemoglobin: 13.3 g/dL (ref 12.0–15.0)
MCH: 27.9 pg (ref 26.0–34.0)
MCHC: 32.4 g/dL (ref 30.0–36.0)
MCV: 86.1 fL (ref 78.0–100.0)
MPV: 11.6 fL (ref 8.6–12.4)
PLATELETS: 210 10*3/uL (ref 150–400)
RBC: 4.76 MIL/uL (ref 3.87–5.11)
RDW: 13.5 % (ref 11.5–15.5)
WBC: 11.3 10*3/uL — AB (ref 4.0–10.5)

## 2014-12-25 NOTE — Progress Notes (Signed)
Patient ID: Robin Rice, female   DOB: 1950-07-05, 64 y.o.   MRN: [id number] 64 y.o. [id number] MarriedAfrican AmericanF here for annual exam.  H/O hysterectomy. Sexually active, no pain. C/O a low libido, able to orgasm and enjoy intercourse. She thinks she has gained 15 lbs in the last year. Not exercising as much. She c/o new onset hirsutism in the last year, just on her chin. She is shaving.   Patient's last menstrual period was 05/06/1999.          Sexually active: Yes.    The current method of family planning is status post hysterectomy.    Exercising: No.  The patient does not participate in regular exercise at present. Smoker:  no  Health Maintenance: Pap:  11-13-08 WNL  History of abnormal Pap:  no MMG:  12-18-14 WNL Colonoscopy:  2014 WNL repeat in 6yrs BMD:   10/2011 WNL  TDaP:  PCP keeps record Screening Labs: PDP does labs   reports that she has never smoked. She has never used smokeless tobacco. She reports that she does not drink alcohol or use illicit drugs.  Past Medical History  Diagnosis Date  . Hypertension   . Hyperlipidemia   . Thyroid disease     hypothyroid, goiter  . Glaucoma     left eye  . Fibroadenoma     Past Surgical History  Procedure Laterality Date  . Abdominal hysterectomy  2001    TAH, menorrhagia  . Abdominoplasty    . Cataract surgery      Current Outpatient Prescriptions  Medication Sig Dispense Refill  . amLODipine (NORVASC) 5 MG tablet daily.    Marland Kitchen aspirin 81 MG tablet Take 81 mg by mouth daily.    Marland Kitchen atorvastatin (LIPITOR) 40 MG tablet Take 1/2 tablet daily    . Cholecalciferol (VITAMIN D PO) Take by mouth daily.    Marland Kitchen levothyroxine (SYNTHROID, LEVOTHROID) 50 MCG tablet Take 50 mcg by mouth daily before breakfast.    . lisinopril-hydrochlorothiazide (PRINZIDE,ZESTORETIC) 20-25 MG per tablet Take 1 tablet by mouth daily.     No current facility-administered medications for this visit.    Family History  Problem Relation Age of  Onset  . Heart disease Mother   . Hypertension Father   . Hypertension Brother   . Hypertension Brother     ROS:  Pertinent items are noted in HPI.  Otherwise, a comprehensive ROS was negative.  Exam:   BP 118/84 mmHg  Pulse 104  Resp 16  Ht 5' 4.5" (1.638 m)  Wt 195 lb (88.451 kg)  BMI 32.97 kg/m2  LMP 05/06/1999  Weight change: @WEIGHTCHANGE @ Height:   Height: 5' 4.5" (163.8 cm)  Ht Readings from Last 3 Encounters:  12/25/14 5' 4.5" (1.638 m)  12/15/13 5' 4.75" (1.645 m)  04/26/13 5\' 4"  (1.626 m)    General appearance: alert, cooperative and appears stated age Skin: mild to moderate hirsutism under her chin Head: Normocephalic, without obvious abnormality, atraumatic Neck: no adenopathy, supple, symmetrical, trachea midline and thyroid normal to inspection and palpation Lungs: clear to auscultation bilaterally Breasts: normal appearance, no masses or tenderness Heart: regular rhythm, heart rate 110 at rest Abdomen: soft, non-tender; bowel sounds normal; no masses,  no organomegaly Extremities: extremities normal, atraumatic, no cyanosis or edema Skin: Skin color, texture, turgor normal. No rashes or lesions Lymph nodes: Cervical, supraclavicular, and axillary nodes normal. No abnormal inguinal nodes palpated Neurologic: Grossly normal   Pelvic: External genitalia:  no lesions  Urethra:  normal appearing urethra with no masses, tenderness or lesions              Bartholins and Skenes: normal                 Vagina: normal appearing vagina with normal color and discharge, no lesions              Cervix: absent              Pap taken: No. Bimanual Exam:  Uterus:  uterus absent              Adnexa: normal adnexa and no mass, fullness, tenderness               Rectovaginal: Confirms               Anus:  normal sphincter tone, no lesions  Chaperone was present for exam.  A:  Well Woman with normal exam  Low libido  Hirsutism  Fatigue  Weight  gain  Tachycardic, ? From anxiety, will recheck. Recheck was 106. May be dehydration (2 cups of coffee, not enough water)  P:   No paps needed  Mammogram just done  Colonoscopy UTD  TSH, hirsutism labs  Information on low libido given

## 2014-12-25 NOTE — Patient Instructions (Signed)

## 2014-12-26 LAB — TESTOSTERONE, FREE, TOTAL, SHBG
SEX HORMONE BINDING: 47 nmol/L (ref 14–73)
TESTOSTERONE-% FREE: 1.4 % (ref 0.4–2.4)
Testosterone, Free: 8.1 pg/mL — ABNORMAL HIGH (ref 0.6–6.8)
Testosterone: 56 ng/dL (ref 10–70)

## 2014-12-26 LAB — DHEA-SULFATE: DHEA-SO4: 150 ug/dL — ABNORMAL HIGH (ref 12–133)

## 2014-12-28 LAB — 17-HYDROXYPROGESTERONE: 17-OH-Progesterone, LC/MS/MS: 23 ng/dL

## 2015-01-05 ENCOUNTER — Other Ambulatory Visit: Payer: Self-pay | Admitting: Obstetrics and Gynecology

## 2015-01-05 DIAGNOSIS — R7989 Other specified abnormal findings of blood chemistry: Secondary | ICD-10-CM

## 2015-01-05 DIAGNOSIS — L680 Hirsutism: Secondary | ICD-10-CM

## 2015-01-05 DIAGNOSIS — D72829 Elevated white blood cell count, unspecified: Secondary | ICD-10-CM

## 2015-01-05 DIAGNOSIS — R635 Abnormal weight gain: Secondary | ICD-10-CM

## 2015-09-20 DIAGNOSIS — M179 Osteoarthritis of knee, unspecified: Secondary | ICD-10-CM | POA: Insufficient documentation

## 2015-09-20 DIAGNOSIS — M1710 Unilateral primary osteoarthritis, unspecified knee: Secondary | ICD-10-CM | POA: Insufficient documentation

## 2015-09-20 DIAGNOSIS — E119 Type 2 diabetes mellitus without complications: Secondary | ICD-10-CM | POA: Insufficient documentation

## 2015-09-20 DIAGNOSIS — E040 Nontoxic diffuse goiter: Secondary | ICD-10-CM | POA: Insufficient documentation

## 2015-09-20 DIAGNOSIS — R011 Cardiac murmur, unspecified: Secondary | ICD-10-CM | POA: Insufficient documentation

## 2016-01-02 ENCOUNTER — Ambulatory Visit: Payer: PRIVATE HEALTH INSURANCE | Admitting: Obstetrics and Gynecology

## 2016-01-29 ENCOUNTER — Telehealth: Payer: Self-pay | Admitting: *Deleted

## 2016-01-29 NOTE — Telephone Encounter (Signed)
-----   Message from Salvadore Dom, MD sent at 01/28/2016  2:06 PM EDT ----- Please check that the patient f/u with her primary for her blood work, she is also overdue for an annual exam. Thanks.

## 2016-01-29 NOTE — Telephone Encounter (Signed)
Left message for patient to call back- patient needs AEX scheduled -eh

## 2016-02-13 NOTE — Telephone Encounter (Signed)
Spoke with patient and she states that she just followed up with her PCP on her labs. She also had an annual including Pelvic exam with her PCP.

## 2016-04-15 LAB — LIPID PANEL
CHOLESTEROL: 182 mg/dL (ref 0–200)
HDL: 81 mg/dL — AB (ref 35–70)
LDL CALC: 78 mg/dL
LDL/HDL RATIO: 2.2
TRIGLYCERIDES: 95 mg/dL (ref 40–160)

## 2016-04-15 LAB — HEPATIC FUNCTION PANEL
ALT: 14 U/L (ref 7–35)
AST: 16 U/L (ref 13–35)
Alkaline Phosphatase: 100 U/L (ref 25–125)
Bilirubin, Total: 0.6 mg/dL

## 2016-04-15 LAB — TSH: TSH: 0.88 u[IU]/mL (ref 0.41–5.90)

## 2016-04-15 LAB — CBC AND DIFFERENTIAL
HEMATOCRIT: 40 % (ref 36–46)
Hemoglobin: 13.2 g/dL (ref 12.0–16.0)
NEUTROS ABS: 7 /uL
PLATELETS: 172 10*3/uL (ref 150–399)
WBC: 10.7 10*3/mL

## 2016-04-15 LAB — BASIC METABOLIC PANEL
BUN: 22 mg/dL — AB (ref 4–21)
Creatinine: 0.9 mg/dL (ref 0.5–1.1)
Glucose: 123 mg/dL
Potassium: 3.7 mmol/L (ref 3.4–5.3)
Sodium: 138 mmol/L (ref 137–147)

## 2016-04-15 LAB — HEMOGLOBIN A1C: Hemoglobin A1C: 6.5

## 2016-07-21 ENCOUNTER — Other Ambulatory Visit: Payer: Self-pay

## 2016-07-23 ENCOUNTER — Encounter: Payer: Self-pay | Admitting: Surgical

## 2016-07-31 ENCOUNTER — Telehealth: Payer: Self-pay | Admitting: *Deleted

## 2016-07-31 ENCOUNTER — Encounter: Payer: Self-pay | Admitting: *Deleted

## 2016-07-31 NOTE — Telephone Encounter (Signed)
PreVisit Call completed. No acute complaints. Mammogram due in August, she would like it ordered at her visit so that she can schedule it at her convenience.

## 2016-08-04 ENCOUNTER — Ambulatory Visit (INDEPENDENT_AMBULATORY_CARE_PROVIDER_SITE_OTHER): Payer: 59 | Admitting: Family Medicine

## 2016-08-04 ENCOUNTER — Encounter: Payer: Self-pay | Admitting: Family Medicine

## 2016-08-04 VITALS — BP 138/86 | HR 90 | Temp 97.8°F | Ht 64.5 in | Wt 192.6 lb

## 2016-08-04 DIAGNOSIS — I10 Essential (primary) hypertension: Secondary | ICD-10-CM

## 2016-08-04 DIAGNOSIS — E119 Type 2 diabetes mellitus without complications: Secondary | ICD-10-CM

## 2016-08-04 DIAGNOSIS — E785 Hyperlipidemia, unspecified: Secondary | ICD-10-CM

## 2016-08-04 DIAGNOSIS — E669 Obesity, unspecified: Secondary | ICD-10-CM | POA: Diagnosis not present

## 2016-08-04 DIAGNOSIS — E079 Disorder of thyroid, unspecified: Secondary | ICD-10-CM | POA: Diagnosis not present

## 2016-08-04 DIAGNOSIS — E040 Nontoxic diffuse goiter: Secondary | ICD-10-CM | POA: Diagnosis not present

## 2016-08-04 NOTE — Progress Notes (Signed)
Pre visit review using our clinic review tool, if applicable. No additional management support is needed unless otherwise documented below in the visit note. 

## 2016-08-04 NOTE — Progress Notes (Signed)
Robin Rice is a 66 y.o. female is here to [facility].   History of Present Illness:   Robin Rice CMA acting as scribe for Dr. Juleen China.  CC: Patient coming in today to establish care. She has no concerns today.   HPI:  1. Obesity (BMI 30-39.9). Weight gain since menopause. No dedicated exercise. Not watching diet.    2. Essential hypertension. Denies chest pain. Taking medications as prescribed without side effects.   Wt Readings from Last 3 Encounters:  08/04/16 192 lb 9.6 oz (87.4 kg)  12/25/14 195 lb (88.5 kg)  12/15/13 194 lb (88 kg)    reports that she has never smoked. She has never used smokeless tobacco. BP Readings from Last 3 Encounters:  08/04/16 138/86  12/25/14 118/84  12/15/13 124/76   Lab Results  Component Value Date   CREATININE 0.9 04/15/2016     3. Hyperlipidemia, unspecified hyperlipidemia type. The patient does use medications that may worsen dyslipidemias (corticosteroids, progestins, anabolic steroids, diuretics, beta-blockers, amiodarone, cyclosporine, olanzapine). Cardiovascular ROS: no chest pain or dyspnea on exertion.  Lipids:    Component Value Date/Time   CHOL 182 04/15/2016   TRIG 95 04/15/2016   HDL 81 (A) 04/15/2016    4. Simple goiter. Followed by PCP only. On low dose Levothyroxine.     5. IFG (impaired fasting glucose). Tolerating Metformin.  Lab Results  Component Value Date   HGBA1C 6.5 04/15/2016    6. Thyroid disease.  Lab Results  Component Value Date   TSH 0.88 04/15/2016      Health Maintenance Due  Topic Date Due  . Hepatitis C Screening  Jan 05, 1951  . HIV Screening  04/01/1967   PMHx, SurgHx, SocialHx, Medications, and Allergies were reviewed in the Visit Navigator and updated as appropriate.   Past Medical History:  Diagnosis Date  . Fibroadenoma   . Glaucoma, Left Eye   . Goiter   . Hyperlipidemia   . Hypertension    Past Surgical History:  Procedure Laterality Date  . ABDOMINAL  HYSTERECTOMY  2001  . ABDOMINOPLASTY    . CATARACT EXTRACTION W/ INTRAOCULAR LENS  IMPLANT, BILATERAL     Family History  Problem Relation Age of Onset  . Heart disease Mother   . Hypertension Father   . Hypertension Brother   . Hypertension Brother    Social History  Substance Use Topics  . Smoking status: Never Smoker  . Smokeless tobacco: Never Used  . Alcohol use 0.6 oz/week    1 Glasses of wine per week   Current Medications and Allergies:   .  amLODipine (NORVASC) 5 MG tablet, daily., Disp: , Rfl:  .  aspirin 81 MG tablet, Take 81 mg by mouth daily., Disp: , Rfl:  .  atorvastatin (LIPITOR) 40 MG tablet, Take 1/2 tablet daily, Disp: , Rfl:  .  levothyroxine (SYNTHROID, LEVOTHROID) 50 MCG tablet, Take 50 mcg by mouth daily before breakfast., Disp: , Rfl:  .  lisinopril-hydrochlorothiazide (PRINZIDE,ZESTORETIC) 20-25 MG per tablet, Take 1 tablet by mouth daily., Disp: , Rfl:  .  metFORMIN (GLUCOPHAGE) 500 MG tablet, Take 500 mg by mouth daily., Disp: , Rfl:   No Known Allergies   Review of Systems:   Review of Systems  Constitutional: Negative for chills, fever and malaise/fatigue.  HENT: Negative for congestion, ear pain, sinus pain and sore throat.   Eyes: Negative for blurred vision and double vision.  Respiratory: Negative for cough, shortness of breath and wheezing.   Cardiovascular:  Negative for chest pain, palpitations and leg swelling.  Gastrointestinal: Negative for abdominal pain, constipation and diarrhea.  Genitourinary: Negative for dysuria.  Musculoskeletal: Negative for back pain, joint pain and neck pain.  Skin: Negative for itching and rash.  Neurological: Negative for dizziness and headaches.  Psychiatric/Behavioral: Negative for depression, hallucinations and memory loss.   Vitals:   Vitals:   08/04/16 1036  BP: 138/86  Pulse: 90  Temp: 97.8 F (36.6 C)  TempSrc: Oral  SpO2: 99%  Weight: 192 lb 9.6 oz (87.4 kg)  Height: 5' 4.5" (1.638 m)      Body mass index is 32.55 kg/m.   Physical Exam:   Physical Exam  Constitutional: She is oriented to person, place, and time. She appears well-developed and well-nourished. No distress.  HENT:  Head: Normocephalic and atraumatic.  Right Ear: External ear normal.  Left Ear: External ear normal.  Nose: Nose normal.  Mouth/Throat: Oropharynx is clear and moist.  Eyes: EOM are normal. Pupils are equal, round, and reactive to light.  Neck: Normal range of motion. Neck supple.  Cardiovascular: Normal rate, regular rhythm and intact distal pulses.   Pulmonary/Chest: Effort normal.  Abdominal: Soft.  Musculoskeletal: Normal range of motion.  Neurological: She is alert and oriented to person, place, and time.  Skin: Skin is warm.  Psychiatric: She has a normal mood and affect. Her behavior is normal.  Nursing note and vitals reviewed.  Assessment and Plan:    Atley was seen today for establish care.  Diagnoses and all orders for this visit:  Obesity (BMI 30-39.9) Comments: The patient is asked to make an attempt to improve diet and exercise patterns to aid in medical management of this problem.   Essential hypertension Comments: Well controlled.  No signs of complications, medication side effects, or red flags.  Continue current regimen.    Hyperlipidemia, unspecified hyperlipidemia type Comments: Well controlled.  No signs of complications, medication side effects, or red flags.  Continue current regimen.    Simple goiter Comments: Patient has been taking Levothyroxine per previous PCP.   IFG (impaired fasting glucose)  Comments: Lab Results  Component Value Date   HGBA1C 6.5 04/15/2016   Changed to DM in problem list. Patient still getting used to idea of Metformin. Will not push today. Will ask Endocrine to check when they evaluate thyroid.   Thyroid disease Comments:  Lab Results  Component Value Date   TSH 0.88 04/15/2016     . Reviewed expectations  re: course of current medical issues. . Discussed self-management of symptoms. . Outlined signs and symptoms indicating need for more acute intervention. . Patient verbalized understanding and all questions were answered. . See orders for this visit as documented in the electronic medical record. . Patient received an After Visit Summary.  Records requested if needed. I spent 45 minutes with this patient, greater than 50% was face-to-face time counseling regarding the above diagnoses.  CMA served as Education administrator during this visit. History, Physical, and Plan performed by medical provider. Documentation and orders reviewed and attested to. Briscoe Deutscher, D.O.  Briscoe Deutscher, [place], [place] 08/04/2016   Follow-up: No Follow-up on file.  No orders of the defined types were placed in this encounter.  There are no discontinued medications. No orders of the defined types were placed in this encounter.

## 2016-08-21 ENCOUNTER — Encounter: Payer: Self-pay | Admitting: Endocrinology

## 2016-08-27 ENCOUNTER — Encounter: Payer: Self-pay | Admitting: Family Medicine

## 2016-08-29 ENCOUNTER — Telehealth: Payer: Self-pay | Admitting: Obstetrics and Gynecology

## 2016-08-29 NOTE — Telephone Encounter (Signed)
Patient is having left breast soreness.

## 2016-08-29 NOTE — Telephone Encounter (Signed)
Spoke with patient. Patient states she has started a new arm exercise lifting 10lb weights and is experiencing left breast soreness and pressure mostly when lying down. Patient denies skin changes or discharge. Patient states she has taken motrin for discomfort, would like to have this checked at [facility]. Patient scheduled by front office staff for [facility] on 09/08/16, recommended evaluation of breast sooner. Patient states she will have to have appointment b/w hours of 9:30am and 12:30pm, is agreeable to earlier appt. Patient scheduled for [facility] on 09/04/16 at 10am with Dr. Talbert Nan. Last AEX 12/25/14. Patient is agreeable to date and time.  Routing to provider for final review. Patient is agreeable to disposition. Will close encounter.

## 2016-09-02 ENCOUNTER — Telehealth: Payer: Self-pay | Admitting: Family Medicine

## 2016-09-02 NOTE — Telephone Encounter (Signed)
Received 39 pages of records from [facility] , sent them to [place] at [place].

## 2016-09-04 ENCOUNTER — Encounter: Payer: Self-pay | Admitting: Obstetrics and Gynecology

## 2016-09-04 ENCOUNTER — Ambulatory Visit (INDEPENDENT_AMBULATORY_CARE_PROVIDER_SITE_OTHER): Payer: PRIVATE HEALTH INSURANCE | Admitting: Obstetrics and Gynecology

## 2016-09-04 VITALS — BP 158/70 | HR 104 | Resp 18 | Ht 64.25 in | Wt 191.0 lb

## 2016-09-04 DIAGNOSIS — Z01419 Encounter for gynecological examination (general) (routine) without abnormal findings: Secondary | ICD-10-CM | POA: Diagnosis not present

## 2016-09-04 NOTE — Progress Notes (Addendum)
66 y.o. [id number] MarriedAfrican AmericanF here for annual exam.   Last week she was having some left breast pain. Feels better now. She was doing more upper extremity exercises. H/O hysterectomy, no bleeding. Sexually active, no pain.     Patient's last menstrual period was 05/06/1999.          Sexually active: Yes.    The current method of family planning is status post hysterectomy.    Exercising: Yes.    walking Smoker:  no  Health Maintenance: Pap:  11-13-08 WNL  History of abnormal Pap:  no MMG:  12/2015 WNL per patient [facility] Colonoscopy:  2014, 10 year f/u BMD:   2017 Normal per patient  TDaP:  05-18-07 Gardasil: N/A   reports that she has never smoked. She has never used smokeless tobacco. She reports that she drinks about 0.6 oz of alcohol per week . 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   Past Medical History:  Diagnosis Date  . Fibroadenoma   . Glaucoma, Left Eye   . Goiter   . Hyperlipidemia   . Hypertension     Past Surgical History:  Procedure Laterality Date  . ABDOMINAL HYSTERECTOMY  2001  . ABDOMINOPLASTY    . CATARACT EXTRACTION W/ INTRAOCULAR LENS  IMPLANT, BILATERAL      Current Outpatient Prescriptions  Medication Sig Dispense Refill  . amLODipine (NORVASC) 5 MG tablet daily.    Marland Kitchen aspirin 81 MG tablet Take 81 mg by mouth daily.    Marland Kitchen atorvastatin (LIPITOR) 40 MG tablet Take 1/2 tablet daily    . levothyroxine (SYNTHROID, LEVOTHROID) 50 MCG tablet Take 50 mcg by mouth daily before breakfast.    . lisinopril-hydrochlorothiazide (PRINZIDE,ZESTORETIC) 20-25 MG per tablet Take 1 tablet by mouth daily.    . metFORMIN (GLUCOPHAGE) 500 MG tablet Take 500 mg by mouth daily.     No current facility-administered medications for this visit.     Family History  Problem Relation Age of Onset  . Heart disease Mother   . Hypertension Father   . Hypertension Brother   . Hypertension Brother      Review of Systems  Constitutional: Negative.   HENT: Negative.   Eyes: Negative.   Respiratory: Negative.   Cardiovascular: Negative.   Gastrointestinal: Negative.   Endocrine: Negative.   Genitourinary:       Left breast pain   Musculoskeletal: Negative.   Skin: Negative.   Allergic/Immunologic: Negative.   Neurological: Negative.   Psychiatric/Behavioral: Negative.     Exam:   BP (!) 158/70 (BP Location: Right Arm, Patient Position: Sitting, Cuff Size: Large)   Pulse (!) 104   Resp 18   Ht 5' 4.25" (1.632 m)   Wt 191 lb (86.6 kg)   LMP 05/06/1999   BMI 32.53 kg/m   Weight change: @WEIGHTCHANGE @ Height:   Height: 5' 4.25" (163.2 cm)  Ht Readings from Last 3 Encounters:  09/04/16 5' 4.25" (1.632 m)  08/04/16 5' 4.5" (1.638 m)  12/25/14 5' 4.5" (1.638 m)    General appearance: alert, cooperative and appears stated age Head: Normocephalic, without obvious abnormality, atraumatic Neck: no adenopathy, supple, symmetrical, trachea midline and thyroid normal to inspection and palpation Lungs: clear to auscultation bilaterally Cardiovascular: regular rate and rhythm Breasts: normal appearance, no masses or tenderness Abdomen: soft, non-tender; bowel sounds normal; no masses,  no organomegaly Extremities: extremities normal, atraumatic, no cyanosis or edema Skin: Skin color, texture,  turgor normal. No rashes or lesions Lymph nodes: Cervical, supraclavicular, and axillary nodes normal. No abnormal inguinal nodes palpated Neurologic: Grossly normal   Pelvic: External genitalia:  no lesions              Urethra:  normal appearing urethra with no masses, tenderness or lesions              Bartholins and Skenes: normal                 Vagina: normal appearing vagina with normal color and discharge, no lesions              Cervix: absent               Bimanual Exam:  Uterus:  uterus absent              Adnexa: no mass, fullness, tenderness               Rectovaginal:  Confirms               Anus:  normal sphincter tone, no lesions  Chaperone was present for exam.  A:  Well Woman with normal exam, prior hysterectomy  H/O breast tenderness, resolved, normal exam. Call if returns  P:   No pap   Mammogram this summer  Discussed breast self exam  Discussed calcium and vit D intake  Labs with primary MD    Addendum 09/21/16: Mamogram from 12/24/15 received and normal DEXA from 12/24/15: revealed mild osteopenia, FRAX ris of any fracture was 3.1% and hip fracture was 0.1%. F/U DEXA was recommended in 2 years.

## 2016-09-04 NOTE — Patient Instructions (Signed)

## 2016-09-08 ENCOUNTER — Ambulatory Visit: Payer: PRIVATE HEALTH INSURANCE | Admitting: Obstetrics and Gynecology

## 2016-09-21 ENCOUNTER — Encounter: Payer: Self-pay | Admitting: Obstetrics and Gynecology

## 2016-10-02 ENCOUNTER — Ambulatory Visit (INDEPENDENT_AMBULATORY_CARE_PROVIDER_SITE_OTHER): Payer: 59 | Admitting: Endocrinology

## 2016-10-02 ENCOUNTER — Encounter: Payer: Self-pay | Admitting: Endocrinology

## 2016-10-02 VITALS — BP 166/86 | HR 97 | Ht 64.25 in | Wt 190.0 lb

## 2016-10-02 DIAGNOSIS — E079 Disorder of thyroid, unspecified: Secondary | ICD-10-CM

## 2016-10-02 DIAGNOSIS — E119 Type 2 diabetes mellitus without complications: Secondary | ICD-10-CM | POA: Diagnosis not present

## 2016-10-02 LAB — POCT GLYCOSYLATED HEMOGLOBIN (HGB A1C): Hemoglobin A1C: 6

## 2016-10-02 MED ORDER — GLUCOSE BLOOD VI STRP
1.0000 | ORAL_STRIP | 1 refills | Status: DC
Start: 2016-10-02 — End: 2016-10-06

## 2016-10-02 MED ORDER — METFORMIN HCL 500 MG PO TABS: 500.0000 mg | ORAL_TABLET | Freq: Every day | ORAL | 3 refills | Status: DC

## 2016-10-02 NOTE — Progress Notes (Signed)
Subjective:    Patient ID: Robin Rice, female    DOB: February 08, 1951, 66 y.o.   MRN: [id number]  HPI pt is referred by Dr Robin Rice, for diabetes.  Pt states DM was dx'ed in 2017; she has mild if any neuropathy of the lower extremities; she is unaware of any associated chronic complications; he has never been on insulin; pt says his diet is poor, but exercise is good; she has never had GDM, pancreatitis, pancreatic surgery, severe hypoglycemia or DKA.  She is also here for multinodular goiter (dx'ed 2002; bx then showed FOLLICULAR EPITHELIUM IN A PREDOMINANTLY MICROFOLLICULAR PATTERN).   Past Medical History:  Diagnosis Date  . Fibroadenoma   . Glaucoma, Left Eye   . Goiter   . Hyperlipidemia   . Hypertension     Past Surgical History:  Procedure Laterality Date  . ABDOMINAL HYSTERECTOMY  2001  . ABDOMINOPLASTY    . CATARACT EXTRACTION W/ INTRAOCULAR LENS  IMPLANT, BILATERAL      Social History   Social History  . Marital status: Married    Spouse name: N/A  . Number of children: N/A  . Years of education: N/A   Occupational History  . Robin Rice  . 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 Smoking status: Never Smoker  . Smokeless tobacco: Never Used  . Alcohol use 0.6 oz/week    1 Glasses of wine per week  . Drug use: No  . Sexual activity: Yes    Partners: Male    Birth control/ protection: Surgical     Comment: TAH   Other Topics Concern  . Not on file   Social History Narrative  . No narrative on file    Current Outpatient Prescriptions on File Prior to Visit  Medication Sig Dispense Refill  . amLODipine (NORVASC) 5 MG tablet daily.    Marland Kitchen aspirin 81 MG tablet Take 81 mg by mouth daily.    Marland Kitchen atorvastatin (LIPITOR) 40 MG tablet Take 1/2 tablet daily    . levothyroxine (SYNTHROID, LEVOTHROID) 50 MCG tablet Take 50 mcg by mouth daily before breakfast.    . lisinopril-hydrochlorothiazide (PRINZIDE,ZESTORETIC) 20-25  MG per tablet Take 1 tablet by mouth daily.     No current facility-administered medications on file prior to visit.     No Known Allergies  Family History  Problem Relation Age of Onset  . Heart disease Mother   . Hypertension Father   . Hypertension Brother   . Hypertension Brother   . Thyroid disease Neg Hx   . Diabetes Neg Hx     BP (!) 166/86   Pulse 97   Ht 5' 4.25" (1.632 m)   Wt 190 lb (86.2 kg)   LMP 05/06/1999   SpO2 98%   BMI 32.36 kg/m    Review of Systems denies weight loss, blurry vision, headache, chest pain, sob, n/v, urinary frequency, muscle cramps, excessive diaphoresis, depression, cold intolerance, rhinorrhea, neck pain, hoarseness, and easy bruising.    Objective:   Physical Exam VS: see vs page GEN: no distress HEAD: head: no deformity eyes: no periorbital swelling, no proptosis external nose and ears are normal mouth: no lesion seen NECK: right thyroid mass is palpable.   CHEST WALL: no deformity. LUNGS: clear to auscultation CV: reg rate and rhythm, no murmur ABD: abdomen is soft, nontender.  no hepatosplenomegaly.  not distended.  no hernia MUSCULOSKELETAL: muscle bulk and strength are grossly normal.  no  obvious joint swelling.  gait is normal and steady EXTEMITIES: no deformity.  no ulcer on the feet.  feet are of normal color and temp.  Trace bilat leg edema PULSES: dorsalis pedis intact bilat.  no carotid bruit NEURO:  cn 2-12 grossly intact.   readily moves all 4's.  sensation is intact to touch on the feet.  SKIN:  Normal texture and temperature.  No rash or suspicious lesion is visible.   NODES:  None palpable at the neck PSYCH: alert, well-oriented.  Does not appear anxious nor depressed.    Lab Results  Component Value Date   TSH 0.88 04/15/2016   Lab Results  Component Value Date   CREATININE 0.9 04/15/2016   BUN 22 (A) 04/15/2016   NA 138 04/15/2016   K 3.7 04/15/2016   CL 100 12/25/2014   CO2 27 12/25/2014    [place]  (2003): THERE IS SLIGHT DECREASE IN SIZE OF THE COMPLEX MASS ON THE RIGHT.  A1c=6.0%  I have reviewed outside records, and summarized: Pt was noted to have elevated a1c, and referred here. She was noted at [name] practice to have IFG, rather than DM     Assessment & Plan:  Type 2 DM: well-controlled. Edema: this limits rx options. Multinodular goiter, due for recheck.    Patient Instructions  good diet and exercise significantly improve the control of your diabetes.  please let me know if you wish to be referred to a dietician.  high blood sugar is very risky to your health.  you should see an eye doctor and dentist every year.  It is very important to get all recommended vaccinations.  Controlling your blood pressure and cholesterol drastically reduces the damage diabetes does to your body.  Those who smoke should quit.  Please discuss these with your doctor.  check your blood sugar once a week.  vary the time of day when you check, between before the 3 meals, and at bedtime.  also check if you have symptoms of your blood sugar being too high or too low.  please keep a record of the readings and bring it to your next appointment here (or you can bring the meter itself).  You can write it on any piece of paper.  please call us sooner if your blood sugar goes below 70, or if you have a lot of readings over 200. Here is a new meter.  I have sent a prescription to your pharmacy, for strips. Please continue the same metformin.  Let's recheck the thyroid ultrasound.  you will receive a phone call, about a day and time for an appointment.  Please continue the same thyroid medication.  Please come back for a follow-up appointment in 6 months.

## 2016-10-02 NOTE — Patient Instructions (Addendum)
good diet and exercise significantly improve the control of your diabetes.  please let me know if you wish to be referred to a dietician.  high blood sugar is very risky to your health.  you should see an eye doctor and dentist every year.  It is very important to get all recommended vaccinations.  Controlling your blood pressure and cholesterol drastically reduces the damage diabetes does to your body.  Those who smoke should quit.  Please discuss these with your doctor.  check your blood sugar once a week.  vary the time of day when you check, between before the 3 meals, and at bedtime.  also check if you have symptoms of your blood sugar being too high or too low.  please keep a record of the readings and bring it to your next appointment here (or you can bring the meter itself).  You can write it on any piece of paper.  please call us sooner if your blood sugar goes below 70, or if you have a lot of readings over 200. Here is a new meter.  I have sent a prescription to your pharmacy, for strips. Please continue the same metformin.  Let's recheck the thyroid ultrasound.  you will receive a phone call, about a day and time for an appointment.  Please continue the same thyroid medication.  Please come back for a follow-up appointment in 6 months.

## 2016-10-03 ENCOUNTER — Telehealth: Payer: Self-pay | Admitting: Family Medicine

## 2016-10-03 ENCOUNTER — Other Ambulatory Visit: Payer: Self-pay

## 2016-10-03 NOTE — Telephone Encounter (Signed)
Patient called asking about [employer] program. Asked for return call, stated she may not be home and to leave message.

## 2016-10-03 NOTE — Telephone Encounter (Signed)
LM for patient to return call.

## 2016-10-06 ENCOUNTER — Telehealth: Payer: Self-pay | Admitting: Endocrinology

## 2016-10-06 ENCOUNTER — Other Ambulatory Visit: Payer: Self-pay

## 2016-10-06 MED ORDER — GLUCOSE BLOOD VI STRP: 1.0000 | ORAL_STRIP | 1 refills | Status: AC

## 2016-10-06 NOTE — Telephone Encounter (Signed)
Submitted

## 2016-10-06 NOTE — Telephone Encounter (Signed)
Patient asking about prescription for test strips- I do not see one in her chart. Please advise.  Asked for it to be called into   [phone] [address], [place] [phone] (Phone) [phone] (Fax)

## 2016-10-09 ENCOUNTER — Other Ambulatory Visit: Payer: Self-pay

## 2016-10-09 NOTE — Telephone Encounter (Signed)
Left message for patient to return call.

## 2016-10-20 ENCOUNTER — Encounter: Payer: Self-pay | Admitting: Family Medicine

## 2016-10-20 ENCOUNTER — Ambulatory Visit (INDEPENDENT_AMBULATORY_CARE_PROVIDER_SITE_OTHER): Payer: 59 | Admitting: Family Medicine

## 2016-10-20 ENCOUNTER — Other Ambulatory Visit: Payer: Self-pay | Admitting: Family Medicine

## 2016-10-20 VITALS — BP 158/82 | HR 99 | Temp 97.6°F | Ht 64.25 in | Wt 187.6 lb

## 2016-10-20 DIAGNOSIS — Z114 Encounter for screening for human immunodeficiency virus [HIV]: Secondary | ICD-10-CM

## 2016-10-20 DIAGNOSIS — Z1159 Encounter for screening for other viral diseases: Secondary | ICD-10-CM | POA: Diagnosis not present

## 2016-10-20 DIAGNOSIS — Z0000 Encounter for general adult medical examination without abnormal findings: Secondary | ICD-10-CM | POA: Diagnosis not present

## 2016-10-20 NOTE — Progress Notes (Signed)
Subjective:    Robin Rice CMA acting as scribe for Dr. Juleen China.  Robin Rice is a 66 y.o. female and is here for a comprehensive physical exam.  HPI Patient comes in today for a physical. She is down about 3 pounds since her last visit. She is wanting the information about joining the [employer] since her husband has done so well with weight loss and getting blood pressure under control.   Health Maintenance Due  Topic Date Due  . Hepatitis C Screening  05-05-1951  . FOOT EXAM  03/31/1962  . OPHTHALMOLOGY EXAM  03/31/1962  . HIV Screening  04/01/1967   PMHx, SurgHx, SocialHx, Medications, and Allergies were reviewed in the Visit Navigator and updated as appropriate.   Past Medical History:  Diagnosis Date  . Fibroadenoma   . Glaucoma, Left Eye   . Goiter   . Hyperlipidemia   . Hypertension     Past Surgical History:  Procedure Laterality Date  . ABDOMINAL HYSTERECTOMY  2001  . ABDOMINOPLASTY    . CATARACT EXTRACTION W/ INTRAOCULAR LENS  IMPLANT, BILATERAL      Family History  Problem Relation Age of Onset  . Heart disease Mother   . Hypertension Father   . Hypertension Brother   . Hypertension Brother   . Thyroid disease Neg Hx   . Diabetes Neg Hx    Social History  Substance Use Topics  . Smoking status: Never Smoker  . Smokeless tobacco: Never Used  . Alcohol use 0.6 oz/week    1 Glasses of wine per week   Review of Systems:   Review of Systems  Constitutional: Negative for chills, fever and malaise/fatigue.  HENT: Negative for ear pain, sinus pain and sore throat.   Eyes: Negative for blurred vision and double vision.  Respiratory: Negative for cough, shortness of breath and wheezing.   Cardiovascular: Negative for chest pain, palpitations and leg swelling.  Gastrointestinal: Negative for abdominal pain, nausea and vomiting.  Musculoskeletal: Negative for back pain, joint pain and neck pain.  Neurological: Negative for dizziness and headaches.    Psychiatric/Behavioral: Negative for depression, hallucinations and memory loss.   Objective:   BP (!) 158/82   Pulse 99   Temp 97.6 F (36.4 C) (Oral)   Ht 5' 4.25" (1.632 m)   Wt 187 lb 9.6 oz (85.1 kg)   LMP 05/06/1999   SpO2 98%   BMI 31.95 kg/m  Body mass index is 31.95 kg/m.  General Appearance:    Alert, cooperative, no distress, appears stated age  Head:    Normocephalic, without obvious abnormality, atraumatic  Eyes:    PERRL, conjunctiva/corneas clear, EOM's intact, fundi    benign, both eyes  Ears:    Normal TM's and external ear canals, both ears  Nose:   Nares normal, septum midline, mucosa normal, no drainage    or sinus tenderness  Throat:   Lips, mucosa, and tongue normal; teeth and gums normal  Neck:   Supple, symmetrical, trachea midline, no adenopathy;    thyroid:  no enlargement/tenderness/nodules; no carotid   bruit or JVD  Back:     Symmetric, no curvature, ROM normal, no CVA tenderness  Lungs:     Clear to auscultation bilaterally, respirations unlabored  Chest Wall:    No tenderness or deformity   Heart:    Regular rate and rhythm, S1 and S2 normal, no murmur, rub   or gallop  Abdomen:     Soft, non-tender, bowel sounds active  all four quadrants,    no masses, no organomegaly  Extremities:   Extremities normal, atraumatic, no cyanosis or edema. Foot exam completed - normal skin without abnormal findings. Normal sensation.  Pulses:   2+ and symmetric all extremities  Skin:   Skin color, texture, turgor normal, no rashes or lesions  Lymph nodes:   Cervical, supraclavicular, and axillary nodes normal  Neurologic:   CNII-XII intact, normal strength, sensation and reflexes    throughout   Assessment/Plan:   [name] was seen today for annual exam.  Diagnoses and all orders for this visit:  Routine physical examination  Encounter for hepatitis C screening test for low risk patient -     Hepatitis C antibody, reflex  Encounter for screening for  HIV -     HIV antibody   Patient Counseling: [x]    Nutrition: Stressed importance of moderation in sodium/caffeine intake, saturated fat and cholesterol, caloric balance, sufficient intake of fresh fruits, vegetables, fiber, calcium, iron, and 1 mg of folate supplement per day (for females capable of pregnancy).  [x]    Stressed the importance of regular exercise.   [x]    Substance Abuse: Discussed cessation/primary prevention of tobacco, alcohol, or other drug use; driving or other dangerous activities under the influence; availability of treatment for abuse.   [x]    Injury prevention: Discussed safety belts, safety helmets, smoke detector, smoking near bedding or upholstery.   [x]    Sexuality: Discussed sexually transmitted diseases, partner selection, use of condoms, avoidance of unintended pregnancy  and contraceptive alternatives.  [x]    Dental health: Discussed importance of regular tooth brushing, flossing, and dental visits.  [x]    Health maintenance and immunizations reviewed. Please refer to Health maintenance section.   Briscoe Deutscher, DO [facility] [place] served as Education administrator during this visit. History, Physical, and Plan performed by medical provider. The above documentation has been reviewed and is accurate and complete. Briscoe Deutscher, D.O.

## 2016-10-21 LAB — HIV ANTIBODY (ROUTINE TESTING W REFLEX): HIV 1&2 Ab, 4th Generation: NONREACTIVE

## 2016-10-21 LAB — HEPATITIS C ANTIBODY: HCV Ab: NEGATIVE

## 2016-10-24 ENCOUNTER — Ambulatory Visit
Admission: RE | Admit: 2016-10-24 | Discharge: 2016-10-24 | Disposition: A | Discharge status: Home / Self Care | Payer: 59 | Source: Ambulatory Visit | Attending: Endocrinology | Admitting: Endocrinology

## 2016-10-24 DIAGNOSIS — E079 Disorder of thyroid, unspecified: Secondary | ICD-10-CM

## 2016-11-01 ENCOUNTER — Other Ambulatory Visit: Payer: Self-pay | Admitting: Family Medicine

## 2016-11-03 NOTE — Telephone Encounter (Signed)
Please advise on refill. Historical provider.  

## 2016-12-24 LAB — HM MAMMOGRAPHY

## 2016-12-31 ENCOUNTER — Encounter: Payer: Self-pay | Admitting: Surgical

## 2017-01-06 ENCOUNTER — Telehealth: Payer: Self-pay | Admitting: Family Medicine

## 2017-01-06 NOTE — Telephone Encounter (Signed)
Ok to fill?  You have not prescribed before.

## 2017-01-06 NOTE — Telephone Encounter (Signed)
Pharmacy called to advise that patient is requesting a 90 day supply for her lisinopril-hydrochlorothiazide (PRINZIDE,ZESTORETIC) 20-25 MG per tablet. Call the number provided to give the verbal order.

## 2017-02-02 ENCOUNTER — Other Ambulatory Visit: Payer: Self-pay | Admitting: Family Medicine

## 2017-02-03 ENCOUNTER — Other Ambulatory Visit: Payer: Self-pay | Admitting: Surgical

## 2017-02-03 MED ORDER — LISINOPRIL-HYDROCHLOROTHIAZIDE 20-25 MG PO TABS: 1.0000 | ORAL_TABLET | Freq: Every day | ORAL | 1 refills | Status: DC

## 2017-02-03 NOTE — Telephone Encounter (Signed)
We got a fax from [facility] for refill on Lisinopril - HCTZ 20-25 tab daily. This is from historical provider. Please advise on refill.

## 2017-04-03 ENCOUNTER — Encounter: Payer: Self-pay | Admitting: Endocrinology

## 2017-04-03 ENCOUNTER — Other Ambulatory Visit: Payer: Self-pay

## 2017-04-03 ENCOUNTER — Ambulatory Visit (INDEPENDENT_AMBULATORY_CARE_PROVIDER_SITE_OTHER): Payer: Medicare Other | Admitting: Endocrinology

## 2017-04-03 VITALS — BP 143/86 | HR 92 | Wt 190.0 lb

## 2017-04-03 DIAGNOSIS — E119 Type 2 diabetes mellitus without complications: Secondary | ICD-10-CM | POA: Diagnosis not present

## 2017-04-03 LAB — POCT GLYCOSYLATED HEMOGLOBIN (HGB A1C): Hemoglobin A1C: 6.2

## 2017-04-03 MED ORDER — SITAGLIPTIN PHOSPHATE 100 MG PO TABS: 100.0000 mg | ORAL_TABLET | Freq: Every day | ORAL | 11 refills | Status: DC

## 2017-04-03 MED ORDER — SITAGLIPTIN PHOSPHATE 100 MG PO TABS
100.0000 mg | ORAL_TABLET | Freq: Every day | ORAL | 11 refills | Status: DC
Start: 2017-04-03 — End: 2017-04-03

## 2017-04-03 NOTE — Patient Instructions (Addendum)
check your blood sugar once a week.  vary the time of day when you check, between before the 3 meals, and at bedtime.  also check if you have symptoms of your blood sugar being too high or too low.  please keep a record of the readings and bring it to your next appointment here (or you can bring the meter itself).  You can write it on any piece of paper.  please call us sooner if your blood sugar goes below 70, or if you have a lot of readings over 200. Please continue the same medications, except I have sent a prescription to your pharmacy, to add "januvia."   Please come back for a follow-up appointment in 6 months.

## 2017-04-03 NOTE — Progress Notes (Signed)
Subjective:    Patient ID: Robin Rice, female    DOB: 1950-06-28, 66 y.o.   MRN: [id number]  HPI Pt returns for f/u of diabetes mellitus: DM type: 2 Dx'ed: 6222 Complications: none Therapy: metformin GDM: never DKA: never Severe hypoglycemia: never Pancreatitis: never Pancreatic imaging: never  Other: she has never been on insulin; edema limits rx options Interval history: pt states she feels well in general.  She says cbg's are well-controlled. She is also here for multinodular goiter (dx'ed 2002; bx then showed FOLLICULAR EPITHELIUM IN A PREDOMINANTLY MICROFOLLICULAR PATTERN; [place] in 2018 showed Right inferior thyroid nodule meets strict criteria for biopsy, although has decreased in size from the prior report. Either biopsy or continued surveillance may be considered. Past Medical History:  Diagnosis Date  . Fibroadenoma   . Glaucoma, Left Eye   . Goiter   . Hyperlipidemia   . Hypertension     Past Surgical History:  Procedure Laterality Date  . ABDOMINAL HYSTERECTOMY  2001  . ABDOMINOPLASTY    . CATARACT EXTRACTION W/ INTRAOCULAR LENS  IMPLANT, BILATERAL      Social History   Socioeconomic History  . Marital status: Married    Spouse name: Not on file  . Number of children: Not on file  . Years of education: Not on file  . Highest education level: Not on file  Social Needs  . Financial resource strain: Not on file  . Food insecurity - worry: Not on file  . Food insecurity - inability: Not on file  . Transportation needs - medical: Not on file  . Transportation needs - non-medical: Not on file  Occupational History  . 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  . Occupation: Retired    Fish farm manager: [place] [employer]  Tobacco Use  . Smoking status: Never Smoker  . Smokeless tobacco: Never Used  Substance and Sexual Activity  . Alcohol use: Yes    Alcohol/week: 0.6 oz    Types: 1 Glasses of wine per week  . Drug use: No  . Sexual  activity: Yes    Partners: Male    Birth control/protection: Surgical    Comment: TAH  Other Topics Concern  . Not on file  Social History Narrative  . Not on file    Current Outpatient Medications on File Prior to Visit  Medication Sig Dispense Refill  . amLODipine (NORVASC) 5 MG tablet TAKE 1 TABLET BY MOUTH EVERY DAY 90 tablet 0  . aspirin 81 MG tablet Take 81 mg by mouth daily.    Marland Kitchen atorvastatin (LIPITOR) 40 MG tablet Take 1/2 tablet daily    . glucose blood (ONETOUCH VERIO) test strip 1 each by Other route once a week. And lancets 1/day 30 each 1  . levothyroxine (SYNTHROID, LEVOTHROID) 50 MCG tablet Take 50 mcg by mouth daily before breakfast.    . lisinopril-hydrochlorothiazide (PRINZIDE,ZESTORETIC) 20-25 MG tablet Take 1 tablet by mouth daily. 90 tablet 1  . metFORMIN (GLUCOPHAGE) 500 MG tablet Take 1 tablet (500 mg total) by mouth daily. 90 tablet 3   No current facility-administered medications on file prior to visit.     No Known Allergies  Family History  Problem Relation Age of Onset  . Heart disease Mother   . Hypertension Father   . Hypertension Brother   . Hypertension Brother   . Thyroid disease Neg Hx   . Diabetes Neg Hx     BP (!) 143/86 (BP Location: Right Arm,  Cuff Size: Normal)   Pulse 92   Wt 190 lb (86.2 kg)   LMP 05/06/1999   SpO2 99%   BMI 32.36 kg/m   Review of Systems Denies edema    Objective:   Physical Exam VITAL SIGNS:  See vs page GENERAL: no distress Pulses: dorsalis pedis intact bilat.   MSK: no deformity of the feet CV: no leg edema.  Skin:  no ulcer on the feet.  normal color and temp on the feet.  Neuro: sensation is intact to touch on the feet.   A1c=6.2%  Lab Results  Component Value Date   CREATININE 0.9 04/15/2016   BUN 22 (A) 04/15/2016   NA 138 04/15/2016   K 3.7 04/15/2016   CL 100 12/25/2014   CO2 27 12/25/2014   Lab Results  Component Value Date   TSH 0.88 04/15/2016      Assessment & Plan:  Type 2  DM: Needs increased rx, if it can be done with a regimen that avoids or minimizes hypoglycemia.  Edema: despite being intermittent, it precludes pioglitizone  Patient Instructions  check your blood sugar once a week.  vary the time of day when you check, between before the 3 meals, and at bedtime.  also check if you have symptoms of your blood sugar being too high or too low.  please keep a record of the readings and bring it to your next appointment here (or you can bring the meter itself).  You can write it on any piece of paper.  please call us sooner if your blood sugar goes below 70, or if you have a lot of readings over 200. Please continue the same medications, except I have sent a prescription to your pharmacy, to add "januvia."   Please come back for a follow-up appointment in 6 months.

## 2017-04-21 ENCOUNTER — Ambulatory Visit: Payer: 59 | Admitting: Family Medicine

## 2017-04-27 ENCOUNTER — Other Ambulatory Visit: Payer: Self-pay

## 2017-04-27 MED ORDER — AMLODIPINE BESYLATE 5 MG PO TABS: 5.0000 mg | ORAL_TABLET | Freq: Every day | ORAL | 0 refills | Status: DC

## 2017-04-30 ENCOUNTER — Ambulatory Visit (INDEPENDENT_AMBULATORY_CARE_PROVIDER_SITE_OTHER): Payer: Medicare Other | Admitting: Family Medicine

## 2017-04-30 ENCOUNTER — Encounter: Payer: Self-pay | Admitting: Family Medicine

## 2017-04-30 VITALS — BP 138/84 | HR 89 | Temp 98.1°F | Ht 64.25 in | Wt 190.6 lb

## 2017-04-30 DIAGNOSIS — E782 Mixed hyperlipidemia: Secondary | ICD-10-CM | POA: Diagnosis not present

## 2017-04-30 DIAGNOSIS — E119 Type 2 diabetes mellitus without complications: Secondary | ICD-10-CM | POA: Diagnosis not present

## 2017-04-30 DIAGNOSIS — E669 Obesity, unspecified: Secondary | ICD-10-CM

## 2017-04-30 DIAGNOSIS — E079 Disorder of thyroid, unspecified: Secondary | ICD-10-CM | POA: Insufficient documentation

## 2017-04-30 DIAGNOSIS — I10 Essential (primary) hypertension: Secondary | ICD-10-CM | POA: Diagnosis not present

## 2017-04-30 LAB — CBC WITH DIFFERENTIAL/PLATELET
Basophils Absolute: 0.1 10*3/uL (ref 0.0–0.1)
Basophils Relative: 0.6 % (ref 0.0–3.0)
Eosinophils Absolute: 0.1 10*3/uL (ref 0.0–0.7)
Eosinophils Relative: 1.7 % (ref 0.0–5.0)
HCT: 40.6 % (ref 36.0–46.0)
Hemoglobin: 13.2 g/dL (ref 12.0–15.0)
Lymphocytes Relative: 32.4 % (ref 12.0–46.0)
Lymphs Abs: 2.9 10*3/uL (ref 0.7–4.0)
MCHC: 32.6 g/dL (ref 30.0–36.0)
MCV: 88.7 fl (ref 78.0–100.0)
Monocytes Absolute: 0.5 10*3/uL (ref 0.1–1.0)
Monocytes Relative: 5.6 % (ref 3.0–12.0)
Neutro Abs: 5.3 10*3/uL (ref 1.4–7.7)
Neutrophils Relative %: 59.7 % (ref 43.0–77.0)
Platelets: 188 10*3/uL (ref 150.0–400.0)
RBC: 4.57 Mil/uL (ref 3.87–5.11)
RDW: 12.9 % (ref 11.5–15.5)
WBC: 8.9 10*3/uL (ref 4.0–10.5)

## 2017-04-30 LAB — LIPID PANEL
Cholesterol: 162 mg/dL (ref 0–200)
HDL: 70.4 mg/dL (ref >39.00)
LDL Cholesterol: 70 mg/dL (ref 0–99)
NonHDL: 91.73
Total CHOL/HDL Ratio: 2
Triglycerides: 110 mg/dL (ref 0.0–149.0)
VLDL: 22 mg/dL (ref 0.0–40.0)

## 2017-04-30 LAB — COMPREHENSIVE METABOLIC PANEL
ALT: 12 U/L (ref 0–35)
AST: 16 U/L (ref 0–37)
Albumin: 4.3 g/dL (ref 3.5–5.2)
Alkaline Phosphatase: 76 U/L (ref 39–117)
BUN: 19 mg/dL (ref 6–23)
CO2: 29 mEq/L (ref 19–32)
Calcium: 9.5 mg/dL (ref 8.4–10.5)
Chloride: 102 mEq/L (ref 96–112)
Creatinine, Ser: 0.91 mg/dL (ref 0.40–1.20)
GFR: 79.77 mL/min (ref >60.00)
Glucose, Bld: 94 mg/dL (ref 70–99)
Potassium: 3.7 mEq/L (ref 3.5–5.1)
Sodium: 138 mEq/L (ref 135–145)
Total Bilirubin: 0.7 mg/dL (ref 0.2–1.2)
Total Protein: 7.8 g/dL (ref 6.0–8.3)

## 2017-04-30 LAB — TSH: TSH: 0.79 u[IU]/mL (ref 0.35–4.50)

## 2017-04-30 MED ORDER — ATORVASTATIN CALCIUM 20 MG PO TABS: 20.0000 mg | ORAL_TABLET | Freq: Every day | ORAL | 1 refills | Status: DC

## 2017-04-30 NOTE — Progress Notes (Signed)
Robin Rice is a 66 y.o. female is here for follow up.  History of Present Illness:   [name] CMA acting as scribe for Dr. Juleen China.  HPI: See Assessment and Plan section for Problem Based Charting of issues discussed today.  Health Maintenance Due  Topic Date Due  . PNA vac Low Risk Adult (2 of 2 - PPSV23) 03/31/2017   Depression screen PHQ 2/9 08/04/2016  Decreased Interest 0  Down, Depressed, Hopeless 0  PHQ - 2 Score 0   PMHx, SurgHx, SocialHx, FamHx, Medications, and Allergies were reviewed in the Visit Navigator and updated as appropriate.   Patient Active Problem List   Diagnosis Date Noted  . Thyroid disease 04/30/2017  . Thyroid mass 10/02/2016  . Obesity (BMI 30-39.9) 08/04/2016  . Simple goiter 09/20/2015  . Osteoarthritis, knee 09/20/2015  . Type 2 diabetes mellitus without complications ([place]) 43/32/9518  . Cardiac murmur 09/20/2015  . Hypertension   . Hyperlipidemia   . Glaucoma    Social History   Tobacco Use  . Smoking status: Never Smoker  . Smokeless tobacco: Never Used  Substance Use Topics  . Alcohol use: Yes    Alcohol/week: 0.6 oz    Types: 1 Glasses of wine per week  . Drug use: No   Current Medications and Allergies:   .  amLODipine (NORVASC) 5 MG tablet, Take 1 tablet (5 mg total) by mouth daily., Disp: 90 tablet, Rfl: 0 .  aspirin 81 MG tablet, Take 81 mg by mouth daily., Disp: , Rfl:  .  atorvastatin (LIPITOR) 40 MG tablet, Take 1/2 tablet daily, Disp: , Rfl:  .  glucose blood (ONETOUCH VERIO) test strip, 1 each by Other route once a week. And lancets 1/day, Disp: 30 each, Rfl: 1 .  levothyroxine (SYNTHROID, LEVOTHROID) 50 MCG tablet, Take 50 mcg by mouth daily before breakfast., Disp: , Rfl:  .  lisinopril-hydrochlorothiazide (PRINZIDE,ZESTORETIC) 20-25 MG tablet, Take 1 tablet by mouth daily., Disp: 90 tablet, Rfl: 1 .  metFORMIN (GLUCOPHAGE) 500 MG tablet, Take 1 tablet (500 mg total) by mouth daily., Disp: 90 tablet, Rfl:  3 .  sitaGLIPtin (JANUVIA) 100 MG tablet, Take 1 tablet (100 mg total) by mouth daily., Disp: 30 tablet, Rfl: 11 .  atorvastatin (LIPITOR) 20 MG tablet, Take 1 tablet (20 mg total) by mouth daily., Disp: 90 tablet, Rfl: 1  No Known Allergies   Review of Systems   Pertinent items are noted in the HPI. Otherwise, ROS is negative.  Vitals:   Vitals:   04/30/17 0922  BP: 138/84  Pulse: 89  Temp: 98.1 F (36.7 C)  TempSrc: Oral  SpO2: 98%  Weight: 190 lb 9.6 oz (86.5 kg)  Height: 5' 4.25" (1.632 m)     Body mass index is 32.46 kg/m.  Physical Exam:   Physical Exam  Constitutional: She is oriented to person, place, and time. She appears well-developed and well-nourished. No distress.  HENT:  Head: Normocephalic and atraumatic.  Right Ear: External ear normal.  Left Ear: External ear normal.  Nose: Nose normal.  Mouth/Throat: Oropharynx is clear and moist.  Eyes: Conjunctivae and EOM are normal. Pupils are equal, round, and reactive to light.  Neck: Normal range of motion. Neck supple. No thyromegaly present.  Cardiovascular: Normal rate, regular rhythm, normal heart sounds and intact distal pulses.  Pulmonary/Chest: Effort normal and breath sounds normal.  Abdominal: Soft. Bowel sounds are normal.  Musculoskeletal: Normal range of motion.  Lymphadenopathy:    She  has no cervical adenopathy.  Neurological: She is alert and oriented to person, place, and time.  Skin: Skin is warm and dry. Capillary refill takes less than 2 seconds.  Psychiatric: She has a normal mood and affect. Her behavior is normal.  Nursing note and vitals reviewed.   Results for orders placed or performed in visit on 04/30/17  CBC with Differential/Platelet  Result Value Ref Range   WBC 8.9 4.0 - 10.5 K/uL   RBC 4.57 3.87 - 5.11 Mil/uL   Hemoglobin 13.2 12.0 - 15.0 g/dL   HCT 40.6 36.0 - 46.0 %   MCV 88.7 78.0 - 100.0 fl   MCHC 32.6 30.0 - 36.0 g/dL   RDW 12.9 11.5 - 15.5 %   Platelets 188.0  150.0 - 400.0 K/uL   Neutrophils Relative % 59.7 43.0 - 77.0 %   Lymphocytes Relative 32.4 12.0 - 46.0 %   Monocytes Relative 5.6 3.0 - 12.0 %   Eosinophils Relative 1.7 0.0 - 5.0 %   Basophils Relative 0.6 0.0 - 3.0 %   Neutro Abs 5.3 1.4 - 7.7 K/uL   Lymphs Abs 2.9 0.7 - 4.0 K/uL   Monocytes Absolute 0.5 0.1 - 1.0 K/uL   Eosinophils Absolute 0.1 0.0 - 0.7 K/uL   Basophils Absolute 0.1 0.0 - 0.1 K/uL  Comprehensive metabolic panel  Result Value Ref Range   Sodium 138 135 - 145 mEq/L   Potassium 3.7 3.5 - 5.1 mEq/L   Chloride 102 96 - 112 mEq/L   CO2 29 19 - 32 mEq/L   Glucose, Bld 94 70 - 99 mg/dL   BUN 19 6 - 23 mg/dL   Creatinine, Ser 0.91 0.40 - 1.20 mg/dL   Total Bilirubin 0.7 0.2 - 1.2 mg/dL   Alkaline Phosphatase 76 39 - 117 U/L   AST 16 0 - 37 U/L   ALT 12 0 - 35 U/L   Total Protein 7.8 6.0 - 8.3 g/dL   Albumin 4.3 3.5 - 5.2 g/dL   Calcium 9.5 8.4 - 10.5 mg/dL   GFR 79.77 >60.00 mL/min  Lipid panel  Result Value Ref Range   Cholesterol 162 0 - 200 mg/dL   Triglycerides 110.0 0.0 - 149.0 mg/dL   HDL 70.40 >39.00 mg/dL   VLDL 22.0 0.0 - 40.0 mg/dL   LDL Cholesterol 70 0 - 99 mg/dL   Total CHOL/HDL Ratio 2    NonHDL 91.73   TSH  Result Value Ref Range   TSH 0.79 0.35 - 4.50 uIU/mL    Assessment and Plan:   1. Obesity (BMI 30-39.9)  Wt Readings from Last 3 Encounters:  04/30/17 190 lb 9.6 oz (86.5 kg)  04/03/17 190 lb (86.2 kg)  10/20/16 187 lb 9.6 oz (85.1 kg)   The patient is asked to make an attempt to improve diet and exercise patterns to aid in medical management of this problem.   - CBC with Differential/Platelet - Comprehensive metabolic panel  2. Essential hypertension Avoiding excessive salt intake? []   YES  [x]   NO Trying to exercise on a regular basis? []   YES  [x]   NO Review: taking medications as instructed, no medication side effects noted, no TIAs, no chest pain on exertion, no dyspnea on exertion, no swelling of ankles.  Smoker:  No.  Wt Readings from Last 3 Encounters:  04/30/17 190 lb 9.6 oz (86.5 kg)  04/03/17 190 lb (86.2 kg)  10/20/16 187 lb 9.6 oz (85.1 kg)   BP Readings from Last  3 Encounters:  04/30/17 138/84  04/03/17 (!) 143/86  10/20/16 (!) 158/82   Lab Results  Component Value Date   CREATININE 0.91 04/30/2017   BP improving but not at true goal. She will monitor at home. Will consider increasing Norvasc in the future.  - CBC with Differential/Platelet - Comprehensive metabolic panel - Lipid panel - TSH  3. Type 2 diabetes mellitus without complication, without long-term current use of insulin (HCC) Current symptoms: no polyuria or polydipsia, no chest pain, dyspnea or TIA's, no numbness, tingling or pain in extremities.  Taking medication compliantly without noted sided effects [x]   YES  []   NO  Episodes of hypoglycemia? []   YES  [x]   NO Maintaining a diabetic diet? []   YES  [x]   NO Trying to exercise on a regular basis? []   YES  [x]   NO  On ACE inhibitor or angiotensin II receptor blocker? [x]   YES  []   NO On Aspirin? [x]   YES  []   NO  Lab Results  Component Value Date   HGBA1C 6.2 04/03/2017    No results found for: Derl Barrow  Lab Results  Component Value Date   CHOL 162 04/30/2017   HDL 70.40 04/30/2017   LDLCALC 70 04/30/2017   TRIG 110.0 04/30/2017   CHOLHDL 2 04/30/2017     Wt Readings from Last 3 Encounters:  04/30/17 190 lb 9.6 oz (86.5 kg)  04/03/17 190 lb (86.2 kg)  10/20/16 187 lb 9.6 oz (85.1 kg)   BP Readings from Last 3 Encounters:  04/30/17 138/84  04/03/17 (!) 143/86  10/20/16 (!) 158/82   Lab Results  Component Value Date   CREATININE 0.91 04/30/2017   - CBC with Differential/Platelet - Comprehensive metabolic panel - Lipid panel - TSH  4. Mixed hyperlipidemia  Is the patient taking medications without problems? [x]   YES  []   NO Does the patient complain of muscle aches?   []   YES  [x]    NO Trying to exercise on a regular basis? []    YES  [x]   NO Diet Compliance: noncompliant some of the time. Cardiovascular ROS: no chest pain or dyspnea on exertion.   Lipids:    Component Value Date/Time   CHOL 162 04/30/2017 0943   TRIG 110.0 04/30/2017 0943   HDL 70.40 04/30/2017 0943   VLDL 22.0 04/30/2017 0943   CHOLHDL 2 04/30/2017 0943   - Lipid panel - atorvastatin (LIPITOR) 20 MG tablet; Take 1 tablet (20 mg total) by mouth daily.  Dispense: 90 tablet; Refill: 1  5. Thyroid disease Endocrine ROS: negative for - breast changes, galactorrhea, hair pattern changes, hot flashes, malaise/lethargy, mood swings, palpitations, polydipsia/polyuria, skin changes, temperature intolerance or unexpected weight changes.  - TSH   . Reviewed expectations re: course of current medical issues. . Discussed self-management of symptoms. . Outlined signs and symptoms indicating need for more acute intervention. . Patient verbalized understanding and all questions were answered. Marland Kitchen Health Maintenance issues including appropriate healthy diet, exercise, and smoking avoidance were discussed with patient. . See orders for this visit as documented in the electronic medical record. . Patient received an After Visit Summary.   Briscoe Deutscher, DO [place] 04/30/2017

## 2017-05-01 ENCOUNTER — Other Ambulatory Visit: Payer: Self-pay

## 2017-05-01 MED ORDER — METFORMIN HCL 500 MG PO TABS: 500.0000 mg | ORAL_TABLET | Freq: Every day | ORAL | 3 refills | Status: DC

## 2017-05-05 HISTORY — PX: FRACTURE SURGERY: SHX138

## 2017-05-09 ENCOUNTER — Telehealth: Payer: Self-pay | Admitting: Endocrinology

## 2017-05-09 DIAGNOSIS — E119 Type 2 diabetes mellitus without complications: Secondary | ICD-10-CM

## 2017-05-09 NOTE — Telephone Encounter (Signed)
Clarise Cruz please call patient: Robin Rice next available. She needs appt with Vaughan Basta the same day.  I have put in referral.

## 2017-05-09 NOTE — Telephone Encounter (Signed)
-----   Message from Briscoe Deutscher, DO sent at 05/09/2017  2:27 PM EST ----- Dr. Loanne Drilling,  I saw our mutual patient last week. You recently started Januvia. At each visit, she asks about medication-assisted weight loss. My question for you - would it be appropriate to use a GLP-1 RA instead of the Januvia? Or is this a "six - half dozen" situation?   Thanks! Danae Chen

## 2017-05-11 NOTE — Telephone Encounter (Signed)
Patient returned call & appt was made for 1/29. I stated thatr she should get a call from the [facility] to make appt with Vaughan Basta. I also provided her with that number because patient stated she may try to cal them to go ahead & get appt made.

## 2017-05-11 NOTE — Telephone Encounter (Signed)
I called to make patient next available appt when Dr. Loanne Drilling & Vaughan Basta are here, since appt needs to be same day. Patient was driving & will call back to make that appt.

## 2017-06-02 ENCOUNTER — Encounter: Payer: Self-pay | Admitting: Endocrinology

## 2017-06-02 ENCOUNTER — Ambulatory Visit (INDEPENDENT_AMBULATORY_CARE_PROVIDER_SITE_OTHER): Payer: Medicare Other | Admitting: Endocrinology

## 2017-06-02 ENCOUNTER — Encounter: Payer: Medicare Other | Attending: Endocrinology | Admitting: Nutrition

## 2017-06-02 VITALS — BP 170/82 | HR 96 | Wt 191.0 lb

## 2017-06-02 DIAGNOSIS — E119 Type 2 diabetes mellitus without complications: Secondary | ICD-10-CM

## 2017-06-02 DIAGNOSIS — Z713 Dietary counseling and surveillance: Secondary | ICD-10-CM | POA: Diagnosis not present

## 2017-06-02 LAB — POCT GLYCOSYLATED HEMOGLOBIN (HGB A1C): Hemoglobin A1C: 5.8

## 2017-06-02 MED ORDER — DULAGLUTIDE 1.5 MG/0.5ML ~~LOC~~ SOAJ: 1.5000 mg | SUBCUTANEOUS | 11 refills | Status: DC

## 2017-06-02 NOTE — Patient Instructions (Signed)
Please continue the same metformin, and: Change the [place] to "trulicity."  I have sent a prescription to your pharmacy. check your blood sugar once a day.  vary the time of day when you check, between before the 3 meals, and at bedtime.  also check if you have symptoms of your blood sugar being too high or too low.  please keep a record of the readings and bring it to your next appointment here (or you can bring the meter itself).  You can write it on any piece of paper.  please call us sooner if your blood sugar goes below 70, or if you have a lot of readings over 200. Please come back for a follow-up appointment in 3 months.

## 2017-06-02 NOTE — Progress Notes (Signed)
Subjective:    Patient ID: Robin Rice, female    DOB: [birth year], 67 y.o.   MRN: [id number]  HPI Pt returns for f/u of diabetes mellitus: DM type: 2 Dx'ed: 8338 Complications: none Therapy: 2 oral meds GDM: never DKA: never Severe hypoglycemia: never.   Pancreatitis: never Pancreatic imaging: never  Other: she has never been on insulin; edema limits rx options Interval history: pt states she feels well in general.  She says cbg's are well-controlled. She also has multinodular goiter (dx'ed 2002; bx then showed FOLLICULAR EPITHELIUM IN A PREDOMINANTLY MICROFOLLICULAR PATTERN; [place] in 2018 showed Right inferior thyroid nodule has decreased in size from the prior.   Past Medical History:  Diagnosis Date  . Fibroadenoma   . Glaucoma, Left Eye   . Goiter   . Hyperlipidemia   . Hypertension     Past Surgical History:  Procedure Laterality Date  . ABDOMINAL HYSTERECTOMY  2001  . ABDOMINOPLASTY    . CATARACT EXTRACTION W/ INTRAOCULAR LENS  IMPLANT, BILATERAL      Social History   Socioeconomic History  . Marital status: Married    Spouse name: Not on file  . Number of children: Not on file  . Years of education: Not on file  . Highest education level: Not on file  Social Needs  . Financial resource strain: Not on file  . Food insecurity - worry: Not on file  . Food insecurity - inability: Not on file  . Transportation needs - medical: Not on file  . Transportation needs - non-medical: Not on file  Occupational History  . 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  . Occupation: Retired    Fish farm manager: [employer]  Tobacco Use  . Smoking status: Never Smoker  . Smokeless tobacco: Never Used  Substance and Sexual Activity  . Alcohol use: Yes    Alcohol/week: 0.6 oz    Types: 1 Glasses of wine per week  . Drug use: No  . Sexual activity: Yes    Partners: Male    Birth control/protection: Surgical    Comment: TAH  Other Topics Concern    . Not on file  Social History Narrative  . Not on file    Current Outpatient Medications on File Prior to Visit  Medication Sig Dispense Refill  . amLODipine (NORVASC) 5 MG tablet Take 1 tablet (5 mg total) by mouth daily. 90 tablet 0  . aspirin 81 MG tablet Take 81 mg by mouth daily.    Marland Kitchen atorvastatin (LIPITOR) 20 MG tablet Take 1 tablet (20 mg total) by mouth daily. 90 tablet 1  . glucose blood (ONETOUCH VERIO) test strip 1 each by Other route once a week. And lancets 1/day 30 each 1  . levothyroxine (SYNTHROID, LEVOTHROID) 50 MCG tablet Take 50 mcg by mouth daily before breakfast.    . lisinopril-hydrochlorothiazide (PRINZIDE,ZESTORETIC) 20-25 MG tablet Take 1 tablet by mouth daily. 90 tablet 1  . metFORMIN (GLUCOPHAGE) 500 MG tablet Take 1 tablet (500 mg total) by mouth daily. 90 tablet 3   No current facility-administered medications on file prior to visit.     No Known Allergies  Family History  Problem Relation Age of Onset  . Heart disease Mother   . Hypertension Father   . Hypertension Brother   . Hypertension Brother   . Thyroid disease Neg Hx   . Diabetes Neg Hx     BP (!) 170/82 (BP Location: Left Arm, Patient Position:  Sitting, Cuff Size: Normal)   Pulse 96   Wt 191 lb (86.6 kg)   LMP 05/06/1999   SpO2 99%   BMI 32.53 kg/m    Review of Systems She denies hypoglycemia    Objective:   Physical Exam VITAL SIGNS:  See vs page GENERAL: no distress Pulses: foot pulses are intact bilaterally.   MSK: no deformity of the feet or ankles.  CV: no edema of the legs or ankles.  Skin:  no ulcer on the feet or ankles.  normal color and temp on the feet and ankles Neuro: sensation is intact to touch on the feet and ankles.     Lab Results  Component Value Date   HGBA1C 5.8 06/02/2017      Assessment & Plan:  Insulin-requiring type 2 DM: well-controlled Obesity: persistent. Edema: this limits rx options  Patient Instructions  Please continue the same  metformin, and: Change the januvia to "trulicity."  I have sent a prescription to your pharmacy. check your blood sugar once a day.  vary the time of day when you check, between before the 3 meals, and at bedtime.  also check if you have symptoms of your blood sugar being too high or too low.  please keep a record of the readings and bring it to your next appointment here (or you can bring the meter itself).  You can write it on any piece of paper.  please call us sooner if your blood sugar goes below 70, or if you have a lot of readings over 200. Please come back for a follow-up appointment in 3 months.

## 2017-06-04 NOTE — Progress Notes (Signed)
Discussed how  GLP1s work, and more specifically how Trulicity works to control blood sugar, and side effects.  Also discussed ways to help prevent nausea.  She was shown the pen, how to attach the pen needle, and where/how often to  to inject the medicationQuestions were answered on how long she will need to be on it, and why she will need to continue the Metformin, but to stop her Januvia.  She reported good understanding of this and had no final questions.

## 2017-06-04 NOTE — Patient Instructions (Signed)
Call if questions about how to use Trulicity

## 2017-06-24 ENCOUNTER — Other Ambulatory Visit: Payer: Self-pay

## 2017-06-24 ENCOUNTER — Telehealth: Payer: Self-pay | Admitting: Endocrinology

## 2017-06-24 MED ORDER — DULAGLUTIDE 1.5 MG/0.5ML ~~LOC~~ SOAJ: 1.5000 mg | SUBCUTANEOUS | 11 refills | Status: DC

## 2017-06-24 MED ORDER — ONETOUCH ULTRASOFT LANCETS MISC: 12 refills | Status: AC

## 2017-06-24 NOTE — Telephone Encounter (Signed)
Dr Loanne Drilling prescribed Trulicity for 1 month and would prescribe for 3 months if patient liked it. Patient does so please send script for Trulicity - [facility] month supply to [facility] [address]. Patient also needs script for Lancets -pkg of 100 for OneTouch Verio Flex sent to the same pharmacy above. Patient is on Medicare with a supplement and if sent with that information she will get the Lancets for free (per pharmacy-pharmacy told her to tell Dr that). Patient needs above scripts asap. Will run out this weekend.

## 2017-06-24 NOTE — Telephone Encounter (Signed)
I called LVM for patient to ask what needed to be done so her lancets were free. I out in a note to pharmacy when I sent prescription in, but ask that she call back if that wasn't what was needed.

## 2017-06-29 ENCOUNTER — Other Ambulatory Visit: Payer: Self-pay | Admitting: Family Medicine

## 2017-07-24 ENCOUNTER — Other Ambulatory Visit: Payer: Self-pay

## 2017-07-24 MED ORDER — AMLODIPINE BESYLATE 5 MG PO TABS: 5.0000 mg | ORAL_TABLET | Freq: Every day | ORAL | 0 refills | Status: DC

## 2017-08-05 ENCOUNTER — Other Ambulatory Visit: Payer: Self-pay | Admitting: Family Medicine

## 2017-08-31 ENCOUNTER — Ambulatory Visit: Payer: Medicare Other | Admitting: Endocrinology

## 2017-09-10 ENCOUNTER — Encounter: Payer: Self-pay | Admitting: Obstetrics and Gynecology

## 2017-09-10 ENCOUNTER — Ambulatory Visit (INDEPENDENT_AMBULATORY_CARE_PROVIDER_SITE_OTHER): Payer: Medicare Other | Admitting: Obstetrics and Gynecology

## 2017-09-10 ENCOUNTER — Other Ambulatory Visit: Payer: Self-pay

## 2017-09-10 VITALS — BP 168/76 | HR 104 | Resp 16 | Ht 64.0 in | Wt 186.0 lb

## 2017-09-10 DIAGNOSIS — Z01419 Encounter for gynecological examination (general) (routine) without abnormal findings: Secondary | ICD-10-CM | POA: Diagnosis not present

## 2017-09-10 DIAGNOSIS — M8580 Other specified disorders of bone density and structure, unspecified site: Secondary | ICD-10-CM | POA: Diagnosis not present

## 2017-09-10 DIAGNOSIS — R011 Cardiac murmur, unspecified: Secondary | ICD-10-CM

## 2017-09-10 DIAGNOSIS — E039 Hypothyroidism, unspecified: Secondary | ICD-10-CM | POA: Insufficient documentation

## 2017-09-10 NOTE — Progress Notes (Signed)
Patient's blood pressure rechecked at the end of the appointment in right arm. Reading was 168/76. RN advised patient to follow- up with Dr. Juleen China, PCP. Patient agreeable.

## 2017-09-10 NOTE — Patient Instructions (Signed)
EXERCISE AND DIET:  We recommended that you start or continue a regular exercise program for good health. Regular exercise means any activity that makes your heart beat faster and makes you sweat.  We recommend exercising at least 30 minutes per day at least 3 days a week, preferably 4 or 5.  We also recommend a diet low in fat and sugar.  Inactivity, poor dietary choices and obesity can cause diabetes, heart attack, stroke, and kidney damage, among others.    ALCOHOL AND SMOKING:  Women should limit their alcohol intake to no more than 7 drinks/beers/glasses of wine (combined, not each!) per week. Moderation of alcohol intake to this level decreases your risk of breast cancer and liver damage. And of course, no recreational drugs are part of a healthy lifestyle.  And absolutely no smoking or even second hand smoke. Most people know smoking can cause heart and lung diseases, but did you know it also contributes to weakening of your bones? Aging of your skin?  Yellowing of your teeth and nails?  CALCIUM AND VITAMIN D:  Adequate intake of calcium and Vitamin D are recommended.  The recommendations for exact amounts of these supplements seem to change often, but generally speaking 600 mg of calcium (either carbonate or citrate) and 800 units of Vitamin D per day seems prudent. Certain women may benefit from higher intake of Vitamin D.  If you are among these women, your doctor will have told you during your visit.    PAP SMEARS:  Pap smears, to check for cervical cancer or precancers,  have traditionally been done yearly, although recent scientific advances have shown that most women can have pap smears less often.  However, every woman still should have a physical exam from her gynecologist every year. It will include a breast check, inspection of the vulva and vagina to check for abnormal growths or skin changes, a visual exam of the cervix, and then an exam to evaluate the size and shape of the uterus and  ovaries.  And after 66 years of age, a rectal exam is indicated to check for rectal cancers. We will also provide age appropriate advice regarding health maintenance, like when you should have certain vaccines, screening for sexually transmitted diseases, bone density testing, colonoscopy, mammograms, etc.   MAMMOGRAMS:  All women over 40 years old should have a yearly mammogram. Many facilities now offer a "3D" mammogram, which may cost around $50 extra out of pocket. If possible,  we recommend you accept the option to have the 3D mammogram performed.  It both reduces the number of women who will be called back for extra views which then turn out to be normal, and it is better than the routine mammogram at detecting truly abnormal areas.    COLONOSCOPY:  Colonoscopy to screen for colon cancer is recommended for all women at age 50.  We know, you hate the idea of the prep.  We agree, BUT, having colon cancer and not knowing it is worse!!  Colon cancer so often starts as a polyp that can be seen and removed at colonscopy, which can quite literally save your life!  And if your first colonoscopy is normal and you have no family history of colon cancer, most women don't have to have it again for 10 years.  Once every ten years, you can do something that may end up saving your life, right?  We will be happy to help you get it scheduled when you are ready.    Be sure to check your insurance coverage so you understand how much it will cost.  It may be covered as a preventative service at no cost, but you should check your particular policy.      Breast Self-Awareness Breast self-awareness means being familiar with how your breasts look and feel. It involves checking your breasts regularly and reporting any changes to your health care provider. Practicing breast self-awareness is important. A change in your breasts can be a sign of a serious medical problem. Being familiar with how your breasts look and feel allows  you to find any problems early, when treatment is more likely to be successful. All women should practice breast self-awareness, including women who have had breast implants. How to do a breast self-exam One way to learn what is normal for your breasts and whether your breasts are changing is to do a breast self-exam. To do a breast self-exam: Look for Changes  1. Remove all the clothing above your waist. 2. Stand in front of a mirror in a room with good lighting. 3. Put your hands on your hips. 4. Push your hands firmly downward. 5. Compare your breasts in the mirror. Look for differences between them (asymmetry), such as: ? Differences in shape. ? Differences in size. ? Puckers, dips, and bumps in one breast and not the other. 6. Look at each breast for changes in your skin, such as: ? Redness. ? Scaly areas. 7. Look for changes in your nipples, such as: ? Discharge. ? Bleeding. ? Dimpling. ? Redness. ? A change in position. Feel for Changes  Carefully feel your breasts for lumps and changes. It is best to do this while lying on your back on the floor and again while sitting or standing in the shower or tub with soapy water on your skin. Feel each breast in the following way:  Place the arm on the side of the breast you are examining above your head.  Feel your breast with the other hand.  Start in the nipple area and make  inch (2 cm) overlapping circles to feel your breast. Use the pads of your three middle fingers to do this. Apply light pressure, then medium pressure, then firm pressure. The light pressure will allow you to feel the tissue closest to the skin. The medium pressure will allow you to feel the tissue that is a little deeper. The firm pressure will allow you to feel the tissue close to the ribs.  Continue the overlapping circles, moving downward over the breast until you feel your ribs below your breast.  Move one finger-width toward the center of the body.  Continue to use the  inch (2 cm) overlapping circles to feel your breast as you move slowly up toward your collarbone.  Continue the up and down exam using all three pressures until you reach your armpit.  Write Down What You Find  Write down what is normal for each breast and any changes that you find. Keep a written record with breast changes or normal findings for each breast. By writing this information down, you do not need to depend only on memory for size, tenderness, or location. Write down where you are in your menstrual cycle, if you are still menstruating. If you are having trouble noticing differences in your breasts, do not get discouraged. With time you will become more familiar with the variations in your breasts and more comfortable with the exam. How often should I examine my breasts? Examine   your breasts every month. If you are breastfeeding, the best time to examine your breasts is after a feeding or after using a breast pump. If you menstruate, the best time to examine your breasts is 5-7 days after your period is over. During your period, your breasts are lumpier, and it may be more difficult to notice changes. When should I see my health care provider? See your health care provider if you notice:  A change in shape or size of your breasts or nipples.  A change in the skin of your breast or nipples, such as a reddened or scaly area.  Unusual discharge from your nipples.  A lump or thick area that was not there before.  Pain in your breasts.  Anything that concerns you.  This information is not intended to replace advice given to you by your health care provider. Make sure you discuss any questions you have with your health care provider. Document Released: 04/21/2005 Document Revised: 09/27/2015 Document Reviewed: 03/11/2015 Elsevier Interactive Patient Education  2018 Elsevier Inc[employer]  

## 2017-09-10 NOTE — Progress Notes (Signed)
67 y.o. [id number] MarriedAfrican AmericanF here for annual exam.  H/O hysterectomy. No bleeding. No dyspareunia.     Patient's last menstrual period was 05/06/1999.          Sexually active: Yes.    The current method of family planning is status post hysterectomy.    Exercising: No.  The patient does not participate in regular exercise at present. Smoker:  no  Health Maintenance: Pap:  11-13-08 WNL  History of abnormal Pap:  no MMG:  12-24-16 WNL  Colonoscopy:  2014 normal  BMD:   12-24-15 low bone mass TDaP:  2009, she will check with her primary  Gardasil: N/A   reports that she has never smoked. She has never used smokeless tobacco. She reports that she drinks about 0.6 oz of alcohol per week. 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 3 grand kids are here, 2 in [place].    Past Medical History:  Diagnosis Date  . DM (diabetes mellitus) ([place])   . Fibroadenoma   . Glaucoma, Left Eye   . Goiter   . Hyperlipidemia   . Hypertension   . Hypothyroid     Past Surgical History:  Procedure Laterality Date  . ABDOMINAL HYSTERECTOMY  2001  . ABDOMINOPLASTY    . CATARACT EXTRACTION W/ INTRAOCULAR LENS  IMPLANT, BILATERAL      Current Outpatient Medications  Medication Sig Dispense Refill  . amLODipine (NORVASC) 5 MG tablet Take 1 tablet (5 mg total) by mouth daily. 90 tablet 0  . aspirin 81 MG tablet Take 81 mg by mouth daily.    Marland Kitchen atorvastatin (LIPITOR) 20 MG tablet Take 1 tablet (20 mg total) by mouth daily. 90 tablet 1  . Dulaglutide (TRULICITY) 1.5 JS/2.8BT SOPN Inject 1.5 mg into the skin once a week. 12 pen 11  . glucose blood (ONETOUCH VERIO) test strip 1 each by Other route once a week. And lancets 1/day 30 each 1  . Lancets (ONETOUCH ULTRASOFT) lancets Used to check blood sugars daily. DX CODE E11.9. 100 each 12  . levothyroxine (SYNTHROID, LEVOTHROID) 50 MCG tablet TAKE 1 TABLET (50 MCG TOTAL) BY MOUTH DAILY. 90 tablet  1  . lisinopril-hydrochlorothiazide (PRINZIDE,ZESTORETIC) 20-25 MG tablet TAKE 1 TABLET BY MOUTH EVERY DAY 90 tablet 1  . metFORMIN (GLUCOPHAGE) 500 MG tablet Take 1 tablet (500 mg total) by mouth daily. 90 tablet 3   No current facility-administered medications for this visit.     Family History  Problem Relation Age of Onset  . Heart disease Mother   . Hypertension Father   . Hypertension Brother   . Hypertension Brother   . Thyroid disease Neg Hx   . Diabetes Neg Hx     Review of Systems  Constitutional: Negative.   HENT: Negative.   Eyes: Negative.   Respiratory: Negative.   Cardiovascular: Negative.   Gastrointestinal: Negative.   Endocrine: Negative.   Genitourinary: Negative.   Musculoskeletal: Negative.   Skin: Negative.   Allergic/Immunologic: Negative.   Neurological: Negative.   Psychiatric/Behavioral: Negative.     Exam:   BP (!) 160/70 (BP Location: Right Arm, Patient Position: Sitting, Cuff Size: Large)   Pulse (!) 104   Resp 16   Ht 5\' 4"  (1.626 m)   Wt 186 lb (84.4 kg)   LMP 05/06/1999   BMI 31.93 kg/m   Weight change: @WEIGHTCHANGE @ Height:   Height: 5\' 4"  (162.6 cm)  Ht Readings  from Last 3 Encounters:  09/10/17 5\' 4"  (1.626 m)  04/30/17 5' 4.25" (1.632 m)  10/20/16 5' 4.25" (1.632 m)    General appearance: alert, cooperative and appears stated age Head: Normocephalic, without obvious abnormality, atraumatic Neck: no adenopathy, supple, symmetrical, trachea midline and thyroid normal to inspection and palpation Lungs: clear to auscultation bilaterally Cardiovascular: regular rate and rhythm, 2-3/6 SEM noted Breasts: normal appearance, no masses or tenderness Abdomen: soft, non-tender; non distended,  no masses,  no organomegaly Extremities: extremities normal, atraumatic, no cyanosis or edema Skin: Skin color, texture, turgor normal. No rashes or lesions Lymph nodes: Cervical, supraclavicular, and axillary nodes normal. No abnormal inguinal  nodes palpated Neurologic: Grossly normal   Pelvic: External genitalia:  no lesions              Urethra:  normal appearing urethra with no masses, tenderness or lesions              Bartholins and Skenes: normal                 Vagina: normal appearing vagina with normal color and discharge, no lesions              Cervix: absent               Bimanual Exam:  Uterus:  uterus absent              Adnexa: no mass, fullness, tenderness               Rectovaginal: Confirms               Anus:  normal sphincter tone, no lesions  Chaperone was present for exam.  A:  Well Woman with normal exam  Osteopenia  Heart murmur, patient reports distant history, not sure if evaluated  HTN, elevated BP today, repeat BP 168/76  DM and hypothyroidism, managed by Endocrinologist   P:   No pap needed  DEXA this summer, script given  Mammogram this summer  She will check with Dr Juleen China if her TDAP is UTD (thinks it is in the records sent to her)  I'm not sure if her heart murmur needs evaluation, will send a note to Dr Drucie Opitz UTD  Discussed breast self exam  Discussed calcium and vit D intake  F/U with primary for HTN  CC: Dr Juleen China

## 2017-09-11 NOTE — Progress Notes (Signed)
App made for 5/14 at 10. Patient aware.

## 2017-09-14 NOTE — Progress Notes (Signed)
Robin Rice is a 67 y.o. female is here for follow up.  History of Present Illness:   Robin Rice, CMA acting as scribe for Dr. Briscoe Deutscher.   HPI: Patient in office today for follow up. She was seen by GYN and was told she needed to follow up with PCP that she hear a murmur. She states that she has had history of this in the past.Her blood pressure was also high in office today. She has been taking her medication as prescribed.    Finger right pinky finger got jammed back about five weeks ago at work. She is able to move it but it is still swollen and has developed bend in finger.   Health Maintenance Due  Topic Date Due  . TETANUS/TDAP  05/17/2017   Depression screen PHQ 2/9 09/15/2017 08/04/2016  Decreased Interest 0 0  Down, Depressed, Hopeless 0 0  PHQ - 2 Score 0 0   PMHx, SurgHx, SocialHx, FamHx, Medications, and Allergies were reviewed in the Visit Navigator and updated as appropriate.   Patient Active Problem List   Diagnosis Date Noted  . Hypothyroid   . Thyroid disease 04/30/2017  . Thyroid mass 10/02/2016  . Obesity (BMI 30-39.9) 08/04/2016  . Simple goiter 09/20/2015  . Osteoarthritis, knee 09/20/2015  . Type 2 diabetes mellitus without complications ([place]) 13/12/6576  . Cardiac murmur 09/20/2015  . Hypertension   . Hyperlipidemia   . Glaucoma    Social History   Tobacco Use  . Smoking status: Never Smoker  . Smokeless tobacco: Never Used  Substance Use Topics  . Alcohol use: Yes    Alcohol/week: 0.6 oz    Types: 1 Glasses of wine per week  . Drug use: No   Current Medications and Allergies:   Current Outpatient Medications:  .  amLODipine (NORVASC) 5 MG tablet, Take 1 tablet (5 mg total) by mouth daily., Disp: 90 tablet, Rfl: 0 .  aspirin 81 MG tablet, Take 81 mg by mouth daily., Disp: , Rfl:  .  atorvastatin (LIPITOR) 20 MG tablet, Take 1 tablet (20 mg total) by mouth daily., Disp: 90 tablet, Rfl: 1 .  Dulaglutide (TRULICITY) 1.5  IO/9.6EX SOPN, Inject 1.5 mg into the skin once a week., Disp: 12 pen, Rfl: 11 .  glucose blood (ONETOUCH VERIO) test strip, 1 each by Other route once a week. And lancets 1/day, Disp: 30 each, Rfl: 1 .  Lancets (ONETOUCH ULTRASOFT) lancets, Used to check blood sugars daily. DX CODE E11.9., Disp: 100 each, Rfl: 12 .  levothyroxine (SYNTHROID, LEVOTHROID) 50 MCG tablet, TAKE 1 TABLET (50 MCG TOTAL) BY MOUTH DAILY., Disp: 90 tablet, Rfl: 1 .  lisinopril-hydrochlorothiazide (PRINZIDE,ZESTORETIC) 20-25 MG tablet, TAKE 1 TABLET BY MOUTH EVERY DAY, Disp: 90 tablet, Rfl: 1 .  metFORMIN (GLUCOPHAGE) 500 MG tablet, Take 1 tablet (500 mg total) by mouth daily., Disp: 90 tablet, Rfl: 3  No Known Allergies   Review of Systems   Pertinent items are noted in the HPI. Otherwise, ROS is negative.  Vitals:   Vitals:   09/15/17 0958  BP: (!) 160/80  Pulse: 92  Temp: 98.6 F (37 C)  TempSrc: Oral  SpO2: 98%  Weight: 186 lb 6.4 oz (84.6 kg)  Height: 5\' 4"  (1.626 m)     Body mass index is 32 kg/m.  Physical Exam:   Physical Exam  Constitutional: She is oriented to person, place, and time. She appears well-developed and well-nourished. No distress.  HENT:  Head: Normocephalic  and atraumatic.  Right Ear: External ear normal.  Left Ear: External ear normal.  Nose: Nose normal.  Mouth/Throat: Oropharynx is clear and moist.  Eyes: Pupils are equal, round, and reactive to light. Conjunctivae and EOM are normal.  Neck: Normal range of motion. Neck supple. No thyromegaly present.  Cardiovascular: Normal rate, regular rhythm and intact distal pulses.  Murmur heard.  Systolic murmur is present with a grade of 3/6. Pulmonary/Chest: Effort normal and breath sounds normal.  Abdominal: Soft. Bowel sounds are normal.  Musculoskeletal:       Right hand: She exhibits decreased range of motion, deformity and swelling. She exhibits normal capillary refill.       Hands: Lymphadenopathy:    She has no  cervical adenopathy.  Neurological: She is alert and oriented to person, place, and time.  Skin: Skin is warm and dry. Capillary refill takes less than 2 seconds.  Psychiatric: She has a normal mood and affect. Her behavior is normal.  Nursing note and vitals reviewed.   Results for orders placed or performed in visit on 06/02/17  POCT HgB A1C  Result Value Ref Range   Hemoglobin A1C 5.8    Assessment and Plan:   Robin Rice was seen today for follow-up.  Diagnoses and all orders for this visit:  Hypertension associated with diabetes ([place]) -     amLODipine (NORVASC) 10 MG tablet; Take 1 tablet (10 mg total) by mouth daily.  Hyperlipidemia due to type 2 diabetes mellitus ([place]) Comments: Stable.  Type 2 diabetes mellitus without complication, without long-term current use of insulin ([place]) Comments: Doing well. No changes.   Injury of finger of right hand, initial encounter Comments: Comminuted fx on xray. To Orthopedist.  Orders: -     DG Finger [place] Right  Functional systolic murmur  Need for pneumococcal vaccine -     Pneumococcal polysaccharide vaccine 23-valent greater than or equal to 2yo subcutaneous/IM   . Reviewed expectations re: course of current medical issues. . Discussed self-management of symptoms. . Outlined signs and symptoms indicating need for more acute intervention. . Patient verbalized understanding and all questions were answered. Marland Kitchen Health Maintenance issues including appropriate healthy diet, exercise, and smoking avoidance were discussed with patient. . See orders for this visit as documented in the electronic medical record. . Patient received an After Visit Summary.  Briscoe Deutscher, DO [place] 09/16/2017  Future Appointments  Date Time Provider [place]  09/29/2017 11:00 AM Renato Shin, MD [facility] None  10/30/2017  9:00 AM Briscoe Deutscher, DO [facility]  10/04/2018  2:30 PM Salvadore Dom, MD [place] None      CMA served as scribe during this visit. History, Physical, and Plan performed by medical provider. The above documentation has been reviewed and is accurate and complete. Briscoe Deutscher, D.O.

## 2017-09-15 ENCOUNTER — Encounter: Payer: Self-pay | Admitting: Family Medicine

## 2017-09-15 ENCOUNTER — Ambulatory Visit (INDEPENDENT_AMBULATORY_CARE_PROVIDER_SITE_OTHER): Payer: Medicare Other

## 2017-09-15 ENCOUNTER — Ambulatory Visit (INDEPENDENT_AMBULATORY_CARE_PROVIDER_SITE_OTHER): Payer: Medicare Other | Admitting: Family Medicine

## 2017-09-15 VITALS — BP 160/80 | HR 92 | Temp 98.6°F | Ht 64.0 in | Wt 186.4 lb

## 2017-09-15 DIAGNOSIS — I152 Hypertension secondary to endocrine disorders: Secondary | ICD-10-CM

## 2017-09-15 DIAGNOSIS — E119 Type 2 diabetes mellitus without complications: Secondary | ICD-10-CM

## 2017-09-15 DIAGNOSIS — E1169 Type 2 diabetes mellitus with other specified complication: Secondary | ICD-10-CM | POA: Diagnosis not present

## 2017-09-15 DIAGNOSIS — Z23 Encounter for immunization: Secondary | ICD-10-CM | POA: Diagnosis not present

## 2017-09-15 DIAGNOSIS — S6991XA Unspecified injury of right wrist, hand and finger(s), initial encounter: Secondary | ICD-10-CM | POA: Diagnosis not present

## 2017-09-15 DIAGNOSIS — I10 Essential (primary) hypertension: Secondary | ICD-10-CM | POA: Diagnosis not present

## 2017-09-15 DIAGNOSIS — E785 Hyperlipidemia, unspecified: Secondary | ICD-10-CM | POA: Diagnosis not present

## 2017-09-15 DIAGNOSIS — S62626A Displaced fracture of medial phalanx of right little finger, initial encounter for closed fracture: Secondary | ICD-10-CM | POA: Diagnosis not present

## 2017-09-15 DIAGNOSIS — R010 Benign and innocent cardiac murmurs: Secondary | ICD-10-CM | POA: Diagnosis not present

## 2017-09-15 DIAGNOSIS — E1159 Type 2 diabetes mellitus with other circulatory complications: Secondary | ICD-10-CM | POA: Diagnosis not present

## 2017-09-15 MED ORDER — AMLODIPINE BESYLATE 10 MG PO TABS: 10.0000 mg | ORAL_TABLET | Freq: Every day | ORAL | 2 refills | Status: DC

## 2017-09-16 ENCOUNTER — Encounter: Payer: Self-pay | Admitting: Family Medicine

## 2017-09-29 ENCOUNTER — Encounter: Payer: Self-pay | Admitting: Endocrinology

## 2017-09-29 ENCOUNTER — Ambulatory Visit (INDEPENDENT_AMBULATORY_CARE_PROVIDER_SITE_OTHER): Payer: Medicare Other | Admitting: Endocrinology

## 2017-09-29 VITALS — BP 148/72 | HR 106 | Wt 186.4 lb

## 2017-09-29 DIAGNOSIS — E119 Type 2 diabetes mellitus without complications: Secondary | ICD-10-CM | POA: Diagnosis not present

## 2017-09-29 LAB — POCT GLYCOSYLATED HEMOGLOBIN (HGB A1C): HEMOGLOBIN A1C: 5.6 % (ref 4.0–5.6)

## 2017-09-29 NOTE — Progress Notes (Signed)
Subjective:    Patient ID: Robin Rice, female    DOB: 1950/08/03, 67 y.o.   MRN: [id number]  HPI Pt returns for f/u of diabetes mellitus: DM type: 2 Dx'ed: 4174 Complications: none Therapy: metformin and trulicity.   GDM: never DKA: never Severe hypoglycemia: never.   Pancreatitis: never Pancreatic imaging: never  Other: she has never been on insulin; edema limits rx options Interval history: pt states she feels well in general.  She says cbg's are well-controlled. She also has multinodular goiter (dx'ed 2002; bx then showed FOLLICULAR EPITHELIUM IN A PREDOMINANTLY MICROFOLLICULAR PATTERN; [place] in 2018 showed Right inferior thyroid nodule has decreased in size from the prior.  Past Medical History:  Diagnosis Date  . DM (diabetes mellitus) ([place])   . Fibroadenoma   . Glaucoma, Left Eye   . Goiter   . Hyperlipidemia   . Hypertension   . Hypothyroid     Past Surgical History:  Procedure Laterality Date  . ABDOMINAL HYSTERECTOMY  2001  . ABDOMINOPLASTY    . CATARACT EXTRACTION W/ INTRAOCULAR LENS  IMPLANT, BILATERAL      Social History   Socioeconomic History  . Marital status: Married    Spouse name: Not on file  . Number of children: Not on file  . Years of education: Not on file  . Highest education level: Not on file  Occupational History  . 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  . Occupation: Retired    Fish farm manager: [employer]  Social Needs  . Financial resource strain: Not on file  . Food insecurity:    Worry: Not on file    Inability: Not on file  . Transportation needs:    Medical: Not on file    Non-medical: Not on file  Tobacco Use  . Smoking status: Never Smoker  . Smokeless tobacco: Never Used  Substance and Sexual Activity  . Alcohol use: Yes    Alcohol/week: 0.6 oz    Types: 1 Glasses of wine per week  . Drug use: No  . Sexual activity: Yes    Partners: Male    Birth control/protection: Surgical   Comment: TAH  Lifestyle  . Physical activity:    Days per week: Not on file    Minutes per session: Not on file  . Stress: Not on file  Relationships  . Social connections:    Talks on phone: Not on file    Gets together: Not on file    Attends religious service: Not on file    Active member of club or organization: Not on file    Attends meetings of clubs or organizations: Not on file    Relationship status: Not on file  . Intimate partner violence:    Fear of current or ex partner: Not on file    Emotionally abused: Not on file    Physically abused: Not on file    Forced sexual activity: Not on file  Other Topics Concern  . Not on file  Social History Narrative  . Not on file    Current Outpatient Medications on File Prior to Visit  Medication Sig Dispense Refill  . amLODipine (NORVASC) 10 MG tablet Take 1 tablet (10 mg total) by mouth daily. 90 tablet 2  . aspirin 81 MG tablet Take 81 mg by mouth daily.    Marland Kitchen atorvastatin (LIPITOR) 20 MG tablet Take 1 tablet (20 mg total) by mouth daily. 90 tablet 1  . Dulaglutide (  TRULICITY) 1.5 TG/5.4DI SOPN Inject 1.5 mg into the skin once a week. 12 pen 11  . glucose blood (ONETOUCH VERIO) test strip 1 each by Other route once a week. And lancets 1/day 30 each 1  . Lancets (ONETOUCH ULTRASOFT) lancets Used to check blood sugars daily. DX CODE E11.9. 100 each 12  . levothyroxine (SYNTHROID, LEVOTHROID) 50 MCG tablet TAKE 1 TABLET (50 MCG TOTAL) BY MOUTH DAILY. 90 tablet 1  . lisinopril-hydrochlorothiazide (PRINZIDE,ZESTORETIC) 20-25 MG tablet TAKE 1 TABLET BY MOUTH EVERY DAY 90 tablet 1  . metFORMIN (GLUCOPHAGE) 500 MG tablet Take 1 tablet (500 mg total) by mouth daily. 90 tablet 3   No current facility-administered medications on file prior to visit.     No Known Allergies  Family History  Problem Relation Age of Onset  . Heart disease Mother   . Hypertension Father   . Hypertension Brother   . Hypertension Brother   . Thyroid  disease Neg Hx   . Diabetes Neg Hx     BP (!) 148/72   Pulse (!) 106   Wt 186 lb 6.4 oz (84.6 kg)   LMP 05/06/1999   SpO2 99%   BMI 32.00 kg/m    Review of Systems She denies hypoglycemia.      Objective:   Physical Exam VITAL SIGNS:  See vs page GENERAL: no distress Pulses: dorsalis pedis intact bilat.   MSK: no deformity of the feet CV: no leg edema Skin:  no ulcer on the feet.  normal color and temp on the feet. Neuro: sensation is intact to touch on the feet  Lab Results  Component Value Date   HGBA1C 5.6 09/29/2017       Assessment & Plan:  HTN: is noted today Insulin-requiring type 2 DM: well-controlled.   Patient Instructions  Your blood pressure is high today.  Please see your primary care provider soon, to have it rechecked Please continue the same medications. check your blood sugar once a day.  vary the time of day when you check, between before the 3 meals, and at bedtime.  also check if you have symptoms of your blood sugar being too high or too low.  please keep a record of the readings and bring it to your next appointment here (or you can bring the meter itself).  You can write it on any piece of paper.  please call us sooner if your blood sugar goes below 70, or if you have a lot of readings over 200. Please come back for a follow-up appointment in 6 months.

## 2017-09-29 NOTE — Patient Instructions (Addendum)
Your blood pressure is high today.  Please see your primary care provider soon, to have it rechecked.   Please continue the same medications.   check your blood sugar once a day.  vary the time of day when you check, between before the 3 meals, and at bedtime.  also check if you have symptoms of your blood sugar being too high or too low.  please keep a record of the readings and bring it to your next appointment here (or you can bring the meter itself).  You can write it on any piece of paper.  please call us sooner if your blood sugar goes below 70, or if you have a lot of readings over 200.   Please come back for a follow-up appointment in 6 months.   

## 2017-10-14 ENCOUNTER — Other Ambulatory Visit: Payer: Self-pay | Admitting: Family Medicine

## 2017-10-26 DIAGNOSIS — H40039 Anatomical narrow angle, unspecified eye: Secondary | ICD-10-CM | POA: Diagnosis not present

## 2017-10-26 LAB — HM DIABETES EYE EXAM

## 2017-10-29 NOTE — Progress Notes (Signed)
Robin Rice is a 67 y.o. female is here for follow up.  History of Present Illness:  [name], CMA acting as scribe for Dr. Briscoe Deutscher.   HPI: Patient in office for follow up.   1. Acquired hypothyroidism. Endocrine ROS: negative for - hair pattern changes, malaise/lethargy, mood swings, palpitations, polydipsia/polyuria, skin changes, temperature intolerance or unexpected weight changes.   2. Type 2 diabetes mellitus without complication, without long-term current use of insulin ([place]). Controlled A1c. Saw EYE last week.   3. Morbid obesity ([place]). Weight is stable.   4. Hypertension associated with diabetes ([place]). On Norvasc 10, Lisinopril 20-HCTZ 25. Not checking at home. No CP, SOB, HA, dizziness, edema.    5. Need for diphtheria-tetanus-pertussis (Tdap) vaccine. Okay with Rx today.    8. Hyperlipidemia associated with type 2 diabetes mellitus ([place]). Tolerating statin without issues.   Health Maintenance Due  Topic Date Due  . TETANUS/TDAP  05/17/2017  . OPHTHALMOLOGY EXAM  10/30/2017   Depression screen PHQ 2/9 09/15/2017 08/04/2016  Decreased Interest 0 0  Down, Depressed, Hopeless 0 0  PHQ - 2 Score 0 0   PMHx, SurgHx, SocialHx, FamHx, Medications, and Allergies were reviewed in the Visit Navigator and updated as appropriate.   Patient Active Problem List   Diagnosis Date Noted  . Hypothyroid   . Morbid obesity ([place]) 08/04/2016  . Simple goiter 09/20/2015  . Osteoarthritis, knee 09/20/2015  . Type 2 diabetes mellitus without complications ([place]) 44/81/8563  . Cardiac murmur 09/20/2015  . Hypertension associated with diabetes ([place])   . Hyperlipidemia associated with type 2 diabetes mellitus ([place])   . Glaucoma, left eye    Social History   Tobacco Use  . Smoking status: Never Smoker  . Smokeless tobacco: Never Used  Substance Use Topics  . Alcohol use: Yes    Alcohol/week: 0.6 oz    Types: 1 Glasses of wine per week  . Drug use: No   Current  Medications and Allergies:   Current Outpatient Medications:  .  amLODipine (NORVASC) 10 MG tablet, Take 1 tablet (10 mg total) by mouth daily., Disp: 90 tablet, Rfl: 2 .  aspirin 81 MG tablet, Take 81 mg by mouth daily., Disp: , Rfl:  .  atorvastatin (LIPITOR) 20 MG tablet, Take 1 tablet (20 mg total) by mouth daily., Disp: 90 tablet, Rfl: 1 .  Dulaglutide (TRULICITY) 1.5 JS/9.7WY SOPN, Inject 1.5 mg into the skin once a week., Disp: 12 pen, Rfl: 11 .  glucose blood (ONETOUCH VERIO) test strip, 1 each by Other route once a week. And lancets 1/day, Disp: 30 each, Rfl: 1 .  Lancets (ONETOUCH ULTRASOFT) lancets, Used to check blood sugars daily. DX CODE E11.9., Disp: 100 each, Rfl: 12 .  levothyroxine (SYNTHROID, LEVOTHROID) 50 MCG tablet, TAKE 1 TABLET (50 MCG TOTAL) BY MOUTH DAILY., Disp: 90 tablet, Rfl: 1 .  lisinopril-hydrochlorothiazide (PRINZIDE,ZESTORETIC) 20-25 MG tablet, TAKE 1 TABLET BY MOUTH EVERY DAY, Disp: 90 tablet, Rfl: 1 .  metFORMIN (GLUCOPHAGE) 500 MG tablet, Take 1 tablet (500 mg total) by mouth daily., Disp: 90 tablet, Rfl: 3 .  Tdap (BOOSTRIX) 5-2.5-18.5 LF-MCG/0.5 injection, Inject 0.5 mLs into the muscle once for 1 dose., Disp: 0.5 mL, Rfl: 0   Allergies  Allergen Reactions  . Other    Review of Systems   Pertinent items are noted in the HPI. Otherwise, ROS is negative.  Vitals:   Vitals:   10/30/17 0900  BP: 140/82  Pulse: 100  Temp:  98.6 F (37 C)  TempSrc: Oral  SpO2: 98%  Weight: 185 lb (83.9 kg)  Height: 5\' 4"  (1.626 m)     Body mass index is 31.76 kg/m.  Physical Exam:   Physical Exam  Constitutional: She is oriented to person, place, and time. She appears well-developed and well-nourished. No distress.  HENT:  Head: Normocephalic and atraumatic.  Right Ear: External ear normal.  Left Ear: External ear normal.  Nose: Nose normal.  Mouth/Throat: Oropharynx is clear and moist.  Eyes: Pupils are equal, round, and reactive to light. Conjunctivae  and EOM are normal.  Neck: Normal range of motion. Neck supple. No thyromegaly present.  Cardiovascular: Normal rate, regular rhythm, normal heart sounds and intact distal pulses.  Pulmonary/Chest: Effort normal and breath sounds normal.  Abdominal: Soft. Bowel sounds are normal.  Musculoskeletal: Normal range of motion.  Lymphadenopathy:    She has no cervical adenopathy.  Neurological: She is alert and oriented to person, place, and time.  Skin: Skin is warm and dry. Capillary refill takes less than 2 seconds.  Psychiatric: She has a normal mood and affect. Her behavior is normal.  Nursing note and vitals reviewed.  Results for orders placed or performed in visit on 09/29/17  POCT HgB A1C  Result Value Ref Range   Hemoglobin A1C 5.6 4.0 - 5.6 %   HbA1c, POC (prediabetic range)  5.7 - 6.4 %   HbA1c, POC (controlled diabetic range)  0.0 - 7.0 %    Assessment and Plan:   [place] was seen today for follow-up.  Diagnoses and all orders for this visit:  Acquired hypothyroidism -     TSH -     T4, free  Type 2 diabetes mellitus without complication, without long-term current use of insulin ([facility]) Comments: A1c 5.6 one month ago. Orders: -     CBC with Differential/Platelet -     Comprehensive metabolic panel  Morbid obesity ([place])  Hypertension associated with diabetes ([place])  Need for diphtheria-tetanus-pertussis (Tdap) vaccine -     Tdap vaccine greater than or equal to 7yo IM  Vitamin D deficiency -     VITAMIN D 25 Hydroxy (Vit-D Deficiency, Fractures)  Medication management -     Vitamin B12  Hyperlipidemia associated with type 2 diabetes mellitus ([place]) -     Lipid panel  Other orders -     Tdap (BOOSTRIX) 5-2.5-18.5 LF-MCG/0.5 injection; Inject 0.5 mLs into the muscle once for 1 dose.    . Reviewed expectations re: course of current medical issues. . Discussed self-management of symptoms. . Outlined signs and symptoms indicating need for more acute  intervention. . Patient verbalized understanding and all questions were answered. Marland Kitchen Health Maintenance issues including appropriate healthy diet, exercise, and smoking avoidance were discussed with patient. . See orders for this visit as documented in the electronic medical record. . Patient received an After Visit Summary.  Briscoe Deutscher, DO [place] 10/30/2017  Future Appointments  Date Time Provider [place]  03/30/2018 11:00 AM Renato Shin, MD [facility] None  10/04/2018  2:30 PM Salvadore Dom, MD [place] None

## 2017-10-30 ENCOUNTER — Encounter: Payer: Self-pay | Admitting: Family Medicine

## 2017-10-30 ENCOUNTER — Ambulatory Visit (INDEPENDENT_AMBULATORY_CARE_PROVIDER_SITE_OTHER): Payer: Medicare Other | Admitting: Family Medicine

## 2017-10-30 VITALS — BP 132/78 | HR 100 | Temp 98.6°F | Ht 64.0 in | Wt 185.0 lb

## 2017-10-30 DIAGNOSIS — E559 Vitamin D deficiency, unspecified: Secondary | ICD-10-CM | POA: Diagnosis not present

## 2017-10-30 DIAGNOSIS — E785 Hyperlipidemia, unspecified: Secondary | ICD-10-CM

## 2017-10-30 DIAGNOSIS — I10 Essential (primary) hypertension: Secondary | ICD-10-CM | POA: Diagnosis not present

## 2017-10-30 DIAGNOSIS — I152 Hypertension secondary to endocrine disorders: Secondary | ICD-10-CM

## 2017-10-30 DIAGNOSIS — Z79899 Other long term (current) drug therapy: Secondary | ICD-10-CM | POA: Diagnosis not present

## 2017-10-30 DIAGNOSIS — E1159 Type 2 diabetes mellitus with other circulatory complications: Secondary | ICD-10-CM

## 2017-10-30 DIAGNOSIS — E119 Type 2 diabetes mellitus without complications: Secondary | ICD-10-CM

## 2017-10-30 DIAGNOSIS — E039 Hypothyroidism, unspecified: Secondary | ICD-10-CM | POA: Diagnosis not present

## 2017-10-30 DIAGNOSIS — Z23 Encounter for immunization: Secondary | ICD-10-CM

## 2017-10-30 DIAGNOSIS — E1169 Type 2 diabetes mellitus with other specified complication: Secondary | ICD-10-CM | POA: Diagnosis not present

## 2017-10-30 LAB — COMPREHENSIVE METABOLIC PANEL
ALT: 18 U/L (ref 0–35)
AST: 18 U/L (ref 0–37)
Albumin: 4.2 g/dL (ref 3.5–5.2)
Alkaline Phosphatase: 79 U/L (ref 39–117)
BUN: 13 mg/dL (ref 6–23)
CO2: 30 mEq/L (ref 19–32)
Calcium: 9.5 mg/dL (ref 8.4–10.5)
Chloride: 102 mEq/L (ref 96–112)
Creatinine, Ser: 0.82 mg/dL (ref 0.40–1.20)
GFR: 89.82 mL/min (ref >60.00)
Glucose, Bld: 90 mg/dL (ref 70–99)
Potassium: 3.8 mEq/L (ref 3.5–5.1)
Sodium: 140 mEq/L (ref 135–145)
Total Bilirubin: 0.5 mg/dL (ref 0.2–1.2)
Total Protein: 6.9 g/dL (ref 6.0–8.3)

## 2017-10-30 LAB — LIPID PANEL
Cholesterol: 165 mg/dL (ref 0–200)
HDL: 78.1 mg/dL (ref >39.00)
LDL Cholesterol: 74 mg/dL (ref 0–99)
NonHDL: 86.96
Total CHOL/HDL Ratio: 2
Triglycerides: 66 mg/dL (ref 0.0–149.0)
VLDL: 13.2 mg/dL (ref 0.0–40.0)

## 2017-10-30 LAB — CBC WITH DIFFERENTIAL/PLATELET
Basophils Absolute: 0.1 10*3/uL (ref 0.0–0.1)
Basophils Relative: 0.8 % (ref 0.0–3.0)
Eosinophils Absolute: 0.2 10*3/uL (ref 0.0–0.7)
Eosinophils Relative: 2 % (ref 0.0–5.0)
HCT: 38.3 % (ref 36.0–46.0)
Hemoglobin: 12.8 g/dL (ref 12.0–15.0)
Lymphocytes Relative: 30.8 % (ref 12.0–46.0)
Lymphs Abs: 2.7 10*3/uL (ref 0.7–4.0)
MCHC: 33.5 g/dL (ref 30.0–36.0)
MCV: 88 fl (ref 78.0–100.0)
Monocytes Absolute: 0.4 10*3/uL (ref 0.1–1.0)
Monocytes Relative: 4.9 % (ref 3.0–12.0)
Neutro Abs: 5.4 10*3/uL (ref 1.4–7.7)
Neutrophils Relative %: 61.5 % (ref 43.0–77.0)
Platelets: 210 10*3/uL (ref 150.0–400.0)
RBC: 4.36 Mil/uL (ref 3.87–5.11)
RDW: 13.1 % (ref 11.5–15.5)
WBC: 8.7 10*3/uL (ref 4.0–10.5)

## 2017-10-30 LAB — VITAMIN B12: Vitamin B-12: 318 pg/mL (ref 211–911)

## 2017-10-30 LAB — T4, FREE: Free T4: 1.13 ng/dL (ref 0.60–1.60)

## 2017-10-30 LAB — TSH: TSH: 0.78 u[IU]/mL (ref 0.35–4.50)

## 2017-10-30 LAB — VITAMIN D 25 HYDROXY (VIT D DEFICIENCY, FRACTURES): VITD: 18.97 ng/mL — ABNORMAL LOW (ref 30.00–100.00)

## 2017-10-30 MED ORDER — TETANUS-DIPHTH-ACELL PERTUSSIS 5-2.5-18.5 LF-MCG/0.5 IM SUSP: 0.5000 mL | Freq: Once | INTRAMUSCULAR | 0 refills | Status: DC

## 2017-11-12 DIAGNOSIS — S62629A Displaced fracture of medial phalanx of unspecified finger, initial encounter for closed fracture: Secondary | ICD-10-CM | POA: Insufficient documentation

## 2017-11-30 DIAGNOSIS — M25641 Stiffness of right hand, not elsewhere classified: Secondary | ICD-10-CM | POA: Insufficient documentation

## 2017-12-27 ENCOUNTER — Other Ambulatory Visit: Payer: Self-pay | Admitting: Family Medicine

## 2017-12-30 DIAGNOSIS — M899 Disorder of bone, unspecified: Secondary | ICD-10-CM | POA: Diagnosis not present

## 2017-12-30 DIAGNOSIS — Z1231 Encounter for screening mammogram for malignant neoplasm of breast: Secondary | ICD-10-CM | POA: Diagnosis not present

## 2017-12-30 LAB — HM DEXA SCAN

## 2017-12-30 LAB — HM MAMMOGRAPHY

## 2018-01-01 ENCOUNTER — Encounter: Payer: Self-pay | Admitting: Surgical

## 2018-01-13 ENCOUNTER — Encounter: Payer: Self-pay | Admitting: Surgical

## 2018-01-31 ENCOUNTER — Other Ambulatory Visit: Payer: Self-pay | Admitting: Family Medicine

## 2018-01-31 NOTE — Progress Notes (Signed)
Robin Rice is a 67 y.o. female is here for follow up.  History of Present Illness:   Robin Rice, CMA acting as scribe for Dr. Briscoe Deutscher.   HPI:  Hypertension:  Patient in office for follow up. She has been checking her blood pressure at home and have averaged around 134/80. She denies any swelling, chest pain or shortness of breath. She has not had any headaches. She is taking all medications as prescribed with no side effects. She is avoiding excess salt intake.   Diabetes: She has been checking her blood sugars monthly. She has been working on maintaining a good diet. She has not been exercising regularly. She has not had any urinary symptoms. She is taking medications with no issues or problems.   Patient requested to have TDAP in office today. Patient was informed that their may be a charge after insurance is processed. She states that she has tried getting at pharmacy but has not been able to get and she is around a lot of kids. She would just like to get it in office.   Health Maintenance Due  Topic Date Due  . TETANUS/TDAP  05/17/2017  . INFLUENZA VACCINE  12/03/2017   Depression screen [facility] 2/9 02/01/2018 09/15/2017 08/04/2016  Decreased Interest 0 0 0  Down, Depressed, Hopeless 0 0 0  PHQ - 2 Score 0 0 0  Altered sleeping 0 - -  Tired, decreased energy 0 - -  Change in appetite 0 - -  Feeling bad or failure about yourself  0 - -  Trouble concentrating 0 - -  Moving slowly or fidgety/restless 0 - -  Suicidal thoughts 0 - -  PHQ-9 Score 0 - -  Difficult doing work/chores Not difficult at all - -   PMHx, SurgHx, SocialHx, FamHx, Medications, and Allergies were reviewed in the Visit Navigator and updated as appropriate.   Patient Active Problem List   Diagnosis Date Noted  . Stiffness of finger joint of right hand 11/30/2017  . Fracture of middle phalanx of finger 11/12/2017  . Hypothyroid   . Morbid obesity ([place]) 08/04/2016  . Simple goiter 09/20/2015    . Osteoarthritis, knee 09/20/2015  . Type 2 diabetes mellitus without complications ([place]) 29/51/8841  . Cardiac murmur 09/20/2015  . Hypertension associated with diabetes ([place])   . Hyperlipidemia associated with type 2 diabetes mellitus ([place])   . Glaucoma, left eye    Social History   Tobacco Use  . Smoking status: Never Smoker  . Smokeless tobacco: Never Used  Substance Use Topics  . Alcohol use: Yes    Alcohol/week: 1.0 standard drinks    Types: 1 Glasses of wine per week  . Drug use: No   Current Medications and Allergies:   .  amLODipine (NORVASC) 10 MG tablet, Take 1 tablet (10 mg total) by mouth daily., Disp: 90 tablet, Rfl: 2 .  aspirin 81 MG tablet, Take 81 mg by mouth daily., Disp: , Rfl:  .  atorvastatin (LIPITOR) 20 MG tablet, Take 1 tablet (20 mg total) by mouth daily., Disp: 90 tablet, Rfl: 1 .  cholecalciferol (VITAMIN D) 1000 units tablet, Take 1,000 Units by mouth daily., Disp: , Rfl:  .  Dulaglutide (TRULICITY) 1.5 YS/0.6TK SOPN, Inject 1.5 mg into the skin once a week., Disp: 12 pen, Rfl: 11 .  Lancets (ONETOUCH ULTRASOFT) lancets, Used to check blood sugars daily. DX CODE E11.9., Disp: 100 each, Rfl: 12 .  levothyroxine (SYNTHROID, LEVOTHROID) 50 MCG tablet,  TAKE 1 TABLET (50 MCG TOTAL) BY MOUTH DAILY., Disp: 90 tablet, Rfl: 1 .  lisinopril-hydrochlorothiazide (PRINZIDE,ZESTORETIC) 20-25 MG tablet, TAKE 1 TABLET BY MOUTH EVERY DAY, Disp: 90 tablet, Rfl: 1 .  metFORMIN (GLUCOPHAGE) 500 MG tablet, Take 1 tablet (500 mg total) by mouth daily., Disp: 90 tablet, Rfl: 3 .  Probiotic Product (PROBIOTIC-10 PO), Take by mouth., Disp: , Rfl:    Allergies  Allergen Reactions  . Other    Review of Systems   Pertinent items are noted in the HPI. Otherwise, ROS is negative.  Vitals:   Vitals:   02/01/18 1000  BP: 138/60  Pulse: 68  Temp: 98.4 F (36.9 C)  TempSrc: Oral  SpO2: 98%  Weight: 186 lb 9.6 oz (84.6 kg)  Height: 5\' 4"  (1.626 m)     Body mass index  is 32.03 kg/m.  Physical Exam:   Physical Exam  Constitutional: She appears well-nourished.  HENT:  Head: Normocephalic and atraumatic.  Eyes: Pupils are equal, round, and reactive to light. EOM are normal.  Neck: Normal range of motion. Neck supple.  Cardiovascular: Normal rate, regular rhythm, normal heart sounds and intact distal pulses.  Pulmonary/Chest: Effort normal.  Abdominal: Soft.  Musculoskeletal:  Right pinky finger healing. Still with deformity and ttp.  Skin: Skin is warm.  Psychiatric: She has a normal mood and affect. Her behavior is normal.  Nursing note and vitals reviewed.  Assessment and Plan:   Robin Rice was seen today for follow-up.  Diagnoses and all orders for this visit:  Type 2 diabetes mellitus without complication, without long-term current use of insulin (HCC) -     POCT glycosylated hemoglobin (Hb A1C)  Hypertension associated with diabetes ([place])  Hyperlipidemia associated with type 2 diabetes mellitus ([place])  Need for Tdap vaccination -     Tdap vaccine greater than or equal to 7yo IM   . Reviewed expectations re: course of current medical issues. . Discussed self-management of symptoms. . Outlined signs and symptoms indicating need for more acute intervention. . Patient verbalized understanding and all questions were answered. Marland Kitchen Health Maintenance issues including appropriate healthy diet, exercise, and smoking avoidance were discussed with patient. . See orders for this visit as documented in the electronic medical record. . Patient received an After Visit Summary.  Briscoe Deutscher, DO [place] 02/01/2018

## 2018-02-01 ENCOUNTER — Encounter: Payer: Self-pay | Admitting: Family Medicine

## 2018-02-01 ENCOUNTER — Ambulatory Visit (INDEPENDENT_AMBULATORY_CARE_PROVIDER_SITE_OTHER): Payer: Medicare Other | Admitting: Family Medicine

## 2018-02-01 VITALS — BP 138/60 | HR 68 | Temp 98.4°F | Ht 64.0 in | Wt 186.6 lb

## 2018-02-01 DIAGNOSIS — I10 Essential (primary) hypertension: Secondary | ICD-10-CM

## 2018-02-01 DIAGNOSIS — E119 Type 2 diabetes mellitus without complications: Secondary | ICD-10-CM | POA: Diagnosis not present

## 2018-02-01 DIAGNOSIS — E1169 Type 2 diabetes mellitus with other specified complication: Secondary | ICD-10-CM | POA: Diagnosis not present

## 2018-02-01 DIAGNOSIS — E785 Hyperlipidemia, unspecified: Secondary | ICD-10-CM

## 2018-02-01 DIAGNOSIS — E1159 Type 2 diabetes mellitus with other circulatory complications: Secondary | ICD-10-CM

## 2018-02-01 DIAGNOSIS — Z23 Encounter for immunization: Secondary | ICD-10-CM | POA: Diagnosis not present

## 2018-02-01 DIAGNOSIS — I152 Hypertension secondary to endocrine disorders: Secondary | ICD-10-CM

## 2018-02-01 LAB — POCT GLYCOSYLATED HEMOGLOBIN (HGB A1C): Hemoglobin A1C: 5.2 % (ref 4.0–5.6)

## 2018-02-01 NOTE — Patient Instructions (Addendum)
Stop Metformin.  Marland Kitchen.It takes about 2 weeks for protection to develop after vaccination.  There are many flu viruses, and they are always changing. Each year a new flu vaccine is made to protect against three or four viruses that are likely to cause disease in the upcoming flu season. Even when the vaccine doesn't exactly match these viruses, it may still provide some protection.   Influenza vaccine does not cause flu.  Influenza vaccine may be given at the same time as other vaccines.  3. Talk with your health care provider  Tell your vaccine provider if the person getting the vaccine: ; Has had an allergic reaction after a previous dose of influenza vaccine, or has any severe, life-threatening allergies.  ; Has ever had Guillain-Barr Syndrome (also called GBS).  In some cases, your health care provider may decide to postpone influenza vaccination to a future visit.  People with minor illnesses, such as a cold, may be vaccinated. People who are moderately or severely ill should usually wait until they recover before getting influenza vaccine.  Your health care provider can give you more information.  4. Risks of a reaction  ; Soreness, redness, and swelling where shot is given, fever, muscle aches, and headache can happen after influenza vaccine. ; There may be a very small increased risk of Guillain-Barr Syndrome (GBS) after inactivated influenza vaccine (the flu shot).  Young children who get the flu shot along with pneumococcal vaccine (PCV13), and/or DTaP vaccine at the same time might be slightly more likely to have a seizure caused by fever. Tell your health care provider if a child who is getting flu vaccine has ever had a seizure.  People sometimes faint after medical procedures, including vaccination. Tell your provider if you feel dizzy or have vision changes or ringing in the ears.  As with any medicine, there is a very remote chance of a vaccine causing a severe allergic  reaction, other serious injury, or death.  5. What if there is a serious problem?  An allergic reaction could occur after the vaccinated person leaves the clinic. If you see signs of a severe allergic reaction (hives, swelling of the face and throat, difficulty breathing, a fast heartbeat, dizziness, or weakness), call 9-1-1 and get the person to the nearest hospital.  For other signs that concern you, call your health care provider.  Adverse reactions should be reported to the Vaccine Adverse Event Reporting System (VAERS). Your health care provider will usually file this report, or you can do it yourself. Visit the VAERS website at www.vaers.SamedayNews.es or call 660 743 8865.  VAERS is only for reporting reactions, and VAERS staff do not give medical advice.  6. The National Vaccine Injury Compensation Program  The [employer] Vaccine Injury Compensation Program (VICP) is a federal program that was created to compensate people who may have been injured by certain vaccines. Visit the VICP website at GoldCloset.com.ee or call 989-490-2034 to learn about the program and about filing a claim. There is a time limit to file a claim for compensation.  7. How can I learn more?  ; Ask your health care provider.  ; Call your local or state health department. ; Contact the Centers for Disease Control and Prevention (CDC): - Call 986-435-2871 (1-800-CDC-INFO) or - Visit CDC's influenza website at https://gibson.com/  Vaccine Information Statement (Interim) Inactivated Influenza Vaccine  12/17/2017 42 U.S.C.  [phone]   Department of Health and Geneticist, molecular for Disease Control and Prevention  Office Use Only  .Marland KitchenMarland KitchenMarland KitchenMarland Kitchen  Vaccine Information Statement   Tdap (Tetanus, Diphtheria, Pertussis) Vaccine: What You Need to Know  Many Vaccine Information Statements are available in Spanish and other languages. See AbsolutelyGenuine.com.br. Hojas de Informacin Sobre Vacunas estn disponibles  en espaol y en muchos otros idiomas. Visite https://www.martin.org/  1. Why get vaccinated?  Tetanus, diphtheria, and pertussis are very serious diseases. Tdap vaccine can protect [place] from these diseases.  And, Tdap vaccine given to pregnant women can protect newborn babies against pertussis.  TETANUS (Lockjaw) is rare in the [place] today. It causes painful muscle tightening and stiffness, usually all over the body. . It can lead to tightening of muscles in the head and neck so you can't open your mouth, swallow, or sometimes even breathe. Tetanus kills about 1 out of 10 people who are infected even after receiving the best medical care.    DIPHTHERIA is also rare in the [place] today.  It can cause a thick coating to form in the back of the throat. . It can lead to breathing problems, heart failure, paralysis, and death.  PERTUSSIS (Whooping Cough) causes severe coughing spells, which can cause difficulty breathing, vomiting, and disturbed sleep. . It can also lead to weight loss, incontinence, and rib fractures. Up to 2 in 100 adolescents and 5 in 100 adults with pertussis are hospitalized or have complications, which could include pneumonia or death.   These diseases are caused by bacteria. Diphtheria and pertussis are spread from person to person through secretions from coughing or sneezing. Tetanus enters the body through cuts, scratches, or wounds.  Before vaccines, as many as 200,000 cases of diphtheria, 200,000 cases of pertussis, and hundreds of cases of tetanus, were reported in the [place] each year. Since vaccination began, reports of cases for tetanus and diphtheria have dropped by about 99% and for pertussis by about 80%.  2. Tdap vaccine  Tdap vaccine can protect adolescents and adults from tetanus, diphtheria, and pertussis. One dose of Tdap is routinely given at age 70 or 43.  People who did not get Tdap at that age should get it as soon as  possible.  Tdap is especially important for health care professionals and anyone having close contact with a baby younger than 12 months.    Pregnant women should get a dose of Tdap during every pregnancy, to protect the newborn from pertussis.  Infants are most at risk for severe, life-threatening complications from pertussis.  Another vaccine, called Td, protects against tetanus and diphtheria, but not pertussis. A Td booster should be given every 10 years. Tdap may be given as one of these boosters if you have never gotten Tdap before.  Tdap may also be given after a severe cut or burn to prevent tetanus infection.  Your doctor or the person giving you the vaccine can give you more information.  Tdap may safely be given at the same time as other vaccines.  3. Some people should not get this vaccine  ; A person who has ever had a life-threatening allergic reaction after a previous dose of any diphtheria, tetanus or pertussis containing vaccine, OR has a severe allergy to any part of this vaccine, should not get Tdap vaccine.  Tell the person giving the vaccine about any severe allergies.  ; Anyone who had coma or long repeated seizures within 7 days after a childhood dose of DTP or DTaP, or a previous dose of Tdap, should not get Tdap, unless a cause other than the vaccine was  found.  They can still get Td.  ; Talk to your doctor if you: - have seizures or another nervous system problem, - had severe pain or swelling after any vaccine containing diphtheria, tetanus or pertussis,  - ever had a condition called Guillain Barr Syndrome (GBS), - aren't feeling well on the day the shot is scheduled.  4. Risks  With any medicine, including vaccines, there is a chance of side effects. These are usually mild and go away on their own. Serious reactions are also possible but are rare.   Most people who get Tdap vaccine do not have any problems with it.  Mild Problems following Tdap (Did not  interfere with activities) ; Pain where the shot was given (about 3 in 4 adolescents or 2 in 3 adults) ; Redness or swelling where the shot was given (about 1 person in 5) ; Mild fever of at least 100.5F (up to about 1 in 25 adolescents or 1 in 100 adults) ; Headache (about 3 or 4 people in 10) ; Tiredness (about 1 person in 3 or 4) ; Nausea, vomiting, diarrhea, stomach ache (up to 1 in 4 adolescents or 1 in 10 adults) ; Chills,  sore joints (about 1 person in 10) ; Body aches (about 1 person in 3 or 4)  ; Rash, swollen glands (uncommon)  Moderate Problems following Tdap (Interfered with activities, but did not require medical attention) ; Pain where the shot was given (up to 1 in 5 or 6)  ; Redness or swelling where the shot was given (up to about 1 in 16 adolescents or 1 in 12 adults) ; Fever over 102F (about 1 in 100 adolescents or 1 in 250 adults) ; Headache (about 1 in 7 adolescents or 1 in 10 adults) ; Nausea, vomiting, diarrhea, stomach ache (up to 1 or 3 people in 100) ; Swelling of the entire arm where the shot was given (up to about 1 in 500).   Severe Problems following Tdap (Unable to perform usual activities; required medical attention) ; Swelling, severe pain, bleeding, and redness in the arm where the shot was given (rare).  Problems that could happen after any vaccine:  ; People sometimes faint after a medical procedure, including vaccination. Sitting or lying down for about 15 minutes can help prevent fainting, and injuries caused by a fall. Tell your doctor if you feel dizzy, or have vision changes or ringing in the ears.  ; Some people get severe pain in the shoulder and have difficulty moving the arm where a shot was given. This happens very rarely.  ; Any medication can cause a severe allergic reaction. Such reactions from a vaccine are very rare, estimated at fewer than 1 in a million doses, and would happen within a few minutes to a few hours after the  vaccination.   As with any medicine, there is a very remote chance of a vaccine causing a serious injury or death.  The safety of vaccines is always being monitored. For more information, visit: http://www.aguilar.org/  5. What if there is a serious problem?  What should I look for? ; Look for anything that concerns you, such as signs of a severe allergic reaction, very high fever, or unusual behavior.  ; Signs of a severe allergic reaction can include hives, swelling of the face and throat, difficulty breathing, a fast heartbeat, dizziness, and weakness. These would usually start a few minutes to a few hours after the vaccination.  What should  I do? ; If you think it is a severe allergic reaction or other emergency that can't wait, call 9-1-1 or get the person to the nearest hospital. Otherwise, call your doctor.  ; Afterward, the reaction should be reported to the Vaccine Adverse Event Reporting System (VAERS). Your doctor might file this report, or you can do it yourself through the VAERS web site at www.vaers.SamedayNews.es, or by calling 304-580-8402.  VAERS does not give medical advice.  6. The National Vaccine Injury Compensation Program  The [employer] Vaccine Injury Compensation Program (VICP) is a federal program that was created to compensate people who may have been injured by certain vaccines.  Persons who believe they may have been injured by a vaccine can learn about the program and about filing a claim by calling [phone] or visiting the [place] website at GoldCloset.com.ee. There is a time limit to file a claim for compensation.   7. How can I learn more?  ; Ask your doctor. He or she can give you the vaccine package insert or suggest other sources of information. ; Call your local or state health department. ; Contact the Centers for Disease Control and Prevention (CDC): - Call (302)498-0928 (1-800-CDC-INFO) or - Visit CDC's website at  http://hunter.com/   Vaccine Information Statement  Tdap Vaccine (06/28/2013) 42 U.S.C.  [phone]  Department of Health and Geneticist, molecular for Disease Control and Prevention  Office Use Only

## 2018-02-17 ENCOUNTER — Other Ambulatory Visit: Payer: Self-pay | Admitting: Family Medicine

## 2018-02-17 MED ORDER — DULAGLUTIDE 1.5 MG/0.5ML ~~LOC~~ SOAJ: 1.5000 mg | SUBCUTANEOUS | 11 refills | Status: DC

## 2018-02-17 NOTE — Telephone Encounter (Signed)
Requested Prescriptions  Pending Prescriptions Disp Refills  . Dulaglutide (TRULICITY) 1.5 HK/2.5JD SOPN 12 pen 11    Sig: Inject 1.5 mg into the skin once a week.     Endocrinology:  Diabetes - GLP-1 Receptor Agonists Passed - 02/17/2018  9:53 AM      Passed - HBA1C is between 0 and 7.9 and within 180 days    Hemoglobin A1C  Date Value Ref Range Status  02/01/2018 5.2 4.0 - 5.6 % Final  04/15/2016 6.5  Final         Passed - Valid encounter within last 6 months    Recent Outpatient Visits          2 weeks ago Type 2 diabetes mellitus without complication, without long-term current use of insulin ([place])   [place] Wallace, Helen, DO   3 months ago Acquired hypothyroidism   [facility]ert, [place], DO   5 months ago Hypertension associated with diabetes [facility])   [place] Wallace, [place], [place]   9 months ago Essential hypertension   [place] Wallace, Gough, DO   1 year ago Routine physical examination   [place] Wallace, Danae Chen, DO      Future Appointments            In 2 months Briscoe Deutscher, [place], [facility]

## 2018-02-17 NOTE — Telephone Encounter (Signed)
Copied from [place] #[id number]. Topic: Quick Communication - Rx Refill/Question >> Feb 17, 2018  9:51 AM Bea Graff, NT wrote: Medication: Dulaglutide (TRULICITY) 1.5 BM/1.8MQ SOPN  Has the patient contacted their pharmacy? Yes.   (Agent: If no, request that the patient contact the pharmacy for the refill.) (Agent: If yes, when and what did the pharmacy advise?)  Preferred Pharmacy (with phone number or street name): [id number] - [place], [place] [phone] (Phone) [phone] (Fax)    Agent: Please be advised that RX refills may take up to 3 business days. We ask that you follow-up with your pharmacy.

## 2018-03-30 ENCOUNTER — Ambulatory Visit: Payer: 59 | Admitting: Endocrinology

## 2018-04-08 ENCOUNTER — Ambulatory Visit: Payer: 59 | Admitting: Sports Medicine

## 2018-04-14 ENCOUNTER — Ambulatory Visit (INDEPENDENT_AMBULATORY_CARE_PROVIDER_SITE_OTHER): Payer: Medicare Other | Admitting: Family Medicine

## 2018-04-14 ENCOUNTER — Ambulatory Visit (INDEPENDENT_AMBULATORY_CARE_PROVIDER_SITE_OTHER): Payer: Medicare Other

## 2018-04-14 VITALS — BP 138/60 | HR 100 | Temp 97.7°F | Ht 64.0 in | Wt 188.8 lb

## 2018-04-14 DIAGNOSIS — M25561 Pain in right knee: Secondary | ICD-10-CM

## 2018-04-14 DIAGNOSIS — M25552 Pain in left hip: Secondary | ICD-10-CM | POA: Diagnosis not present

## 2018-04-14 DIAGNOSIS — M1612 Unilateral primary osteoarthritis, left hip: Secondary | ICD-10-CM | POA: Diagnosis not present

## 2018-04-14 MED ORDER — PREDNISONE 5 MG PO TABS: ORAL_TABLET | ORAL | 0 refills | Status: DC

## 2018-04-14 NOTE — Progress Notes (Signed)
Robin Rice is a 67 y.o. female is here for follow up.  History of Present Illness:   Robin Rice, CMA acting as scribe for Dr. Briscoe Rice.   HPI: Patient in for evaluation of right knee pain and left thigh pain. She denies any injury. Pain started about three weeks ago feels like a "throbing tooth ache" in back of knee. Pain level is 6/10. She has tried alive and it does help with pain. When she lies on her right side she has pain in her left thigh. No pain in back. She can have some pain in right foot. Denies any swelling in knee. She does have some skin color changes in ankle but has been like that for as long as she remembers.   Depression screen [name] 2/9 02/01/2018 09/15/2017 08/04/2016  Decreased Interest 0 0 0  Down, Depressed, Hopeless 0 0 0  PHQ - 2 Score 0 0 0  Altered sleeping 0 - -  Tired, decreased energy 0 - -  Change in appetite 0 - -  Feeling bad or failure about yourself  0 - -  Trouble concentrating 0 - -  Moving slowly or fidgety/restless 0 - -  Suicidal thoughts 0 - -  PHQ-9 Score 0 - -  Difficult doing work/chores Not difficult at all - -   PMHx, SurgHx, SocialHx, FamHx, Medications, and Allergies were reviewed in the Visit Navigator and updated as appropriate.   Patient Active Problem List   Diagnosis Date Noted  . Stiffness of finger joint of right hand 11/30/2017  . Fracture of middle phalanx of finger 11/12/2017  . Hypothyroid   . Morbid obesity ([place]) 08/04/2016  . Simple goiter 09/20/2015  . Osteoarthritis, knee 09/20/2015  . Type 2 diabetes mellitus without complications ([place]) 27/07/5007  . Cardiac murmur 09/20/2015  . Hypertension associated with diabetes ([place])   . Hyperlipidemia associated with type 2 diabetes mellitus ([place])   . Glaucoma, left eye    Social History   Tobacco Use  . Smoking status: Never Smoker  . Smokeless tobacco: Never Used  Substance Use Topics  . Alcohol use: Yes    Alcohol/week: 1.0 standard drinks    Types:  1 Glasses of wine per week  . Drug use: No   Current Medications and Allergies:   .  amLODipine (NORVASC) 10 MG tablet, Take 1 tablet (10 mg total) by mouth daily., Disp: 90 tablet, Rfl: 2 .  aspirin 81 MG tablet, Take 81 mg by mouth daily., Disp: , Rfl:  .  atorvastatin (LIPITOR) 20 MG tablet, Take 1 tablet (20 mg total) by mouth daily., Disp: 90 tablet, Rfl: 1 .  cholecalciferol (VITAMIN D) 1000 units tablet, Take 1,000 Units by mouth daily., Disp: , Rfl:  .  Dulaglutide (TRULICITY) 1.5 FG/1.8EX SOPN, Inject 1.5 mg into the skin once a week., Disp: 12 pen, Rfl: 11 .  glucose blood (ONETOUCH VERIO) test strip, 1 each by Other route once a week. And lancets 1/day, Disp: 30 each, Rfl: 1 .  Lancets (ONETOUCH ULTRASOFT) lancets, Used to check blood sugars daily. DX CODE E11.9., Disp: 100 each, Rfl: 12 .  levothyroxine (SYNTHROID, LEVOTHROID) 50 MCG tablet, TAKE 1 TABLET (50 MCG TOTAL) BY MOUTH DAILY., Disp: 90 tablet, Rfl: 1 .  lisinopril-hydrochlorothiazide (PRINZIDE,ZESTORETIC) 20-25 MG tablet, TAKE 1 TABLET BY MOUTH EVERY DAY, Disp: 90 tablet, Rfl: 1 .  Probiotic Product (PROBIOTIC-10 PO), Take by mouth., Disp: , Rfl:    Allergies  Allergen Reactions  .  Other    Review of Systems   Pertinent items are noted in the HPI. Otherwise, a complete ROS is negative.  Vitals:   Vitals:   04/14/18 1052  BP: 138/60  Pulse: 100  Temp: 97.7 F (36.5 C)  TempSrc: Oral  SpO2: 96%  Weight: 188 lb 12.8 oz (85.6 kg)  Height: 5\' 4"  (1.626 m)     Body mass index is 32.41 kg/m.  Physical Exam:   Physical Exam Vitals signs and nursing note reviewed.  HENT:     Head: Normocephalic and atraumatic.  Eyes:     Pupils: Pupils are equal, round, and reactive to light.  Neck:     Musculoskeletal: Normal range of motion and neck supple.  Cardiovascular:     Rate and Rhythm: Normal rate and regular rhythm.     Heart sounds: Normal heart sounds.  Pulmonary:     Effort: Pulmonary effort is  normal.  Abdominal:     Palpations: Abdomen is soft.  Musculoskeletal:     Left hip: She exhibits decreased range of motion.     Right knee: She exhibits decreased range of motion. She exhibits no effusion and no erythema.  Skin:    General: Skin is warm.  Psychiatric:        Behavior: Behavior normal.    Dg Hip Unilat W Or W/o Pelvis 2-3 Views Left  Result Date: 04/14/2018 CLINICAL DATA:  Aching pain in the left hip no injury EXAM: DG HIP (WITH OR WITHOUT PELVIS) 2-3V LEFT COMPARISON:  None. FINDINGS: SI joints are patent. Pubic symphysis and rami appear normal. No fracture or malalignment. Minimal degenerative change of the left hip. IMPRESSION: Minimal degenerative change.  No acute osseous abnormality. Electronically Signed   By: Donavan Foil M.D.   On: 04/14/2018 16:04   Assessment and Plan:   Robin Rice was seen today for knee pain.  Diagnoses and all orders for this visit:  Left hip pain Comments: Consistent with degeneratve changes. Reviewed symptomatic care, OTC treatments, and exercises. To Sports Medicine if not improving.  Orders: -     DG HIP UNILAT W OR W/O PELVIS 2-3 VIEWS LEFT  Acute pain of right knee -     predniSONE (DELTASONE) 5 MG tablet; 6-5-4-3-2-1-off   . Orders and follow up as documented in [place], reviewed diet, exercise and weight control, cardiovascular risk and specific lipid/LDL goals reviewed, reviewed medications and side effects in detail.  . Reviewed expectations re: course of current medical issues. . Outlined signs and symptoms indicating need for more acute intervention. . Patient verbalized understanding and all questions were answered. . Patient received an After Visit Summary.  CMA served as Education administrator during this visit. History, Physical, and Plan performed by medical provider. The above documentation has been reviewed and is accurate and complete. Robin Rice, D.O.  Robin Deutscher, DO [place] 04/25/2018

## 2018-04-25 ENCOUNTER — Encounter: Payer: Self-pay | Admitting: Family Medicine

## 2018-05-03 NOTE — Progress Notes (Signed)
Robin Rice is a 67 y.o. female is here for follow up.  History of Present Illness:   HPI:   1. Acquired hypothyroidism Endocrine ROS: negative for - hair pattern changes, hot flashes, malaise/lethargy, mood swings, palpitations, polydipsia/polyuria, skin changes, temperature intolerance or unexpected weight changes.   2. Hyperlipidemia associated with type 2 diabetes mellitus ([place]). Trying to exercise on a regular basis? Yes. Compliant with diet? Yes.   3. Hypertension associated with diabetes ([place]).  Review: taking medications as instructed, no medication side effects noted, no TIAs, no chest pain on exertion, no dyspnea on exertion, no swelling of ankles. Smoker: No.   BP Readings from Last 3 Encounters:  05/04/18 140/70  04/14/18 138/60  02/01/18 138/60   Lab Results  Component Value Date   CREATININE 0.82 10/30/2017   CREATININE 0.91 04/30/2017   CREATININE 0.9 04/15/2016       4. Type 2 diabetes mellitus with other specified complication, without long-term current use of insulin ([place]). Medication compliance: compliant all of the time, diabetic diet compliance: compliant most of the time, home glucose monitoring: is not performed, further diabetic ROS: no polyuria or polydipsia, no chest pain, dyspnea or TIA's, no numbness, tingling or pain in extremities.    5. Morbid obesity ([place]).   Wt Readings from Last 3 Encounters:  05/04/18 188 lb 6.4 oz (85.5 kg)  04/14/18 188 lb 12.8 oz (85.6 kg)  02/01/18 186 lb 9.6 oz (84.6 kg)      There are no preventive care reminders to display for this patient. Depression screen [name] 2/9 02/01/2018 09/15/2017 08/04/2016  Decreased Interest 0 0 0  Down, Depressed, Hopeless 0 0 0  PHQ - 2 Score 0 0 0  Altered sleeping 0 - -  Tired, decreased energy 0 - -  Change in appetite 0 - -  Feeling bad or failure about yourself  0 - -  Trouble concentrating 0 - -  Moving slowly or fidgety/restless 0 - -  Suicidal thoughts 0 - -  PHQ-9  Score 0 - -  Difficult doing work/chores Not difficult at all - -   PMHx, SurgHx, SocialHx, FamHx, Medications, and Allergies were reviewed in the Visit Navigator and updated as appropriate.   Patient Active Problem List   Diagnosis Date Noted  . Stiffness of finger joint of right hand 11/30/2017  . Fracture of middle phalanx of finger 11/12/2017  . Hypothyroid   . Morbid obesity ([place]) 08/04/2016  . Simple goiter 09/20/2015  . Osteoarthritis, knee 09/20/2015  . Type 2 diabetes mellitus without complications ([place]) 10/29/9483  . Cardiac murmur 09/20/2015  . Hypertension associated with diabetes ([place])   . Hyperlipidemia associated with type 2 diabetes mellitus ([place])   . Glaucoma, left eye    Social History   Tobacco Use  . Smoking status: Never Smoker  . Smokeless tobacco: Never Used  Substance Use Topics  . Alcohol use: Yes    Alcohol/week: 1.0 standard drinks    Types: 1 Glasses of wine per week  . Drug use: No   Current Medications and Allergies:   .  amLODipine (NORVASC) 10 MG tablet, Take 1 tablet (10 mg total) by mouth daily., Disp: 90 tablet, Rfl: 2 .  aspirin 81 MG tablet, Take 81 mg by mouth daily., Disp: , Rfl:  .  atorvastatin (LIPITOR) 20 MG tablet, Take 1 tablet (20 mg total) by mouth daily., Disp: 90 tablet, Rfl: 1 .  cholecalciferol (VITAMIN D) 1000 units tablet, Take 1,000 Units  by mouth daily., Disp: , Rfl:  .  Dulaglutide (TRULICITY) 1.5 HU/3.1SH SOPN, Inject 1.5 mg into the skin once a week., Disp: 12 pen, Rfl: 11 .  levothyroxine (SYNTHROID, LEVOTHROID) 50 MCG tablet, TAKE 1 TABLET (50 MCG TOTAL) BY MOUTH DAILY., Disp: 90 tablet, Rfl: 1 .  lisinopril-hydrochlorothiazide (PRINZIDE,ZESTORETIC) 20-25 MG tablet, TAKE 1 TABLET BY MOUTH EVERY DAY, Disp: 90 tablet, Rfl: 1   Allergies  Allergen Reactions  . Other    Review of Systems   Pertinent items are noted in the HPI. Otherwise, a complete ROS is negative.  Vitals:   Vitals:   05/04/18 0958  BP:  140/70  Pulse: (!) 120  Temp: 98.6 F (37 C)  TempSrc: Oral  SpO2: 97%  Weight: 188 lb 6.4 oz (85.5 kg)  Height: 5\' 4"  (1.626 m)     Body mass index is 32.34 kg/m.  Physical Exam:   Physical Exam Vitals signs and nursing note reviewed.  HENT:     Head: Normocephalic and atraumatic.  Eyes:     Pupils: Pupils are equal, round, and reactive to light.  Neck:     Musculoskeletal: Normal range of motion and neck supple.  Cardiovascular:     Rate and Rhythm: Normal rate and regular rhythm.     Heart sounds: Murmur present. Systolic murmur present.  Pulmonary:     Effort: Pulmonary effort is normal.  Abdominal:     Palpations: Abdomen is soft.  Skin:    General: Skin is warm.  Psychiatric:        Behavior: Behavior normal.     Results for orders placed or performed in visit on 02/01/18  POCT glycosylated hemoglobin (Hb A1C)  Result Value Ref Range   Hemoglobin A1C 5.2 4.0 - 5.6 %   HbA1c POC (<> result, manual entry)     HbA1c, POC (prediabetic range)     HbA1c, POC (controlled diabetic range)      Assessment and Plan:   Sadeen was seen today for follow-up.  Diagnoses and all orders for this visit:  Acquired hypothyroidism -     TSH -     T4, free  Hyperlipidemia associated with type 2 diabetes mellitus ([place]) -     Comprehensive metabolic panel -     Lipid panel  Hypertension associated with diabetes ([place])  Type 2 diabetes mellitus with other specified complication, without long-term current use of insulin (HCC) -     CBC with Differential/Platelet -     Hemoglobin A1c  Morbid obesity ([place])    . Orders and follow up as documented in [place], reviewed diet, exercise and weight control, cardiovascular risk and specific lipid/LDL goals reviewed, reviewed medications and side effects in detail.  . Reviewed expectations re: course of current medical issues. . Outlined signs and symptoms indicating need for more acute intervention. . Patient verbalized  understanding and all questions were answered. . Patient received an After Visit Summary.  Briscoe Deutscher, DO [place], [facility] 05/04/2018

## 2018-05-04 ENCOUNTER — Ambulatory Visit (INDEPENDENT_AMBULATORY_CARE_PROVIDER_SITE_OTHER): Payer: Medicare Other | Admitting: Family Medicine

## 2018-05-04 ENCOUNTER — Encounter: Payer: Self-pay | Admitting: Family Medicine

## 2018-05-04 VITALS — BP 140/70 | HR 120 | Temp 98.6°F | Ht 64.0 in | Wt 188.4 lb

## 2018-05-04 DIAGNOSIS — E039 Hypothyroidism, unspecified: Secondary | ICD-10-CM

## 2018-05-04 DIAGNOSIS — E1159 Type 2 diabetes mellitus with other circulatory complications: Secondary | ICD-10-CM | POA: Diagnosis not present

## 2018-05-04 DIAGNOSIS — I10 Essential (primary) hypertension: Secondary | ICD-10-CM

## 2018-05-04 DIAGNOSIS — E785 Hyperlipidemia, unspecified: Secondary | ICD-10-CM

## 2018-05-04 DIAGNOSIS — E1169 Type 2 diabetes mellitus with other specified complication: Secondary | ICD-10-CM

## 2018-05-04 DIAGNOSIS — I152 Hypertension secondary to endocrine disorders: Secondary | ICD-10-CM

## 2018-05-04 LAB — COMPREHENSIVE METABOLIC PANEL
ALT: 16 U/L (ref 0–35)
AST: 18 U/L (ref 0–37)
Albumin: 4.1 g/dL (ref 3.5–5.2)
Alkaline Phosphatase: 86 U/L (ref 39–117)
BUN: 20 mg/dL (ref 6–23)
CO2: 28 mEq/L (ref 19–32)
Calcium: 9.6 mg/dL (ref 8.4–10.5)
Chloride: 104 mEq/L (ref 96–112)
Creatinine, Ser: 0.95 mg/dL (ref 0.40–1.20)
GFR: 75.67 mL/min (ref >60.00)
Glucose, Bld: 106 mg/dL — ABNORMAL HIGH (ref 70–99)
Potassium: 3.4 mEq/L — ABNORMAL LOW (ref 3.5–5.1)
Sodium: 140 mEq/L (ref 135–145)
Total Bilirubin: 0.6 mg/dL (ref 0.2–1.2)
Total Protein: 7 g/dL (ref 6.0–8.3)

## 2018-05-04 LAB — CBC WITH DIFFERENTIAL/PLATELET
Basophils Absolute: 0.1 10*3/uL (ref 0.0–0.1)
Basophils Relative: 0.6 % (ref 0.0–3.0)
Eosinophils Absolute: 0.2 10*3/uL (ref 0.0–0.7)
Eosinophils Relative: 2.1 % (ref 0.0–5.0)
HCT: 38.6 % (ref 36.0–46.0)
Hemoglobin: 12.9 g/dL (ref 12.0–15.0)
Lymphocytes Relative: 31.5 % (ref 12.0–46.0)
Lymphs Abs: 3 10*3/uL (ref 0.7–4.0)
MCHC: 33.4 g/dL (ref 30.0–36.0)
MCV: 86.8 fl (ref 78.0–100.0)
Monocytes Absolute: 0.6 10*3/uL (ref 0.1–1.0)
Monocytes Relative: 6 % (ref 3.0–12.0)
Neutro Abs: 5.7 10*3/uL (ref 1.4–7.7)
Neutrophils Relative %: 59.8 % (ref 43.0–77.0)
Platelets: 177 10*3/uL (ref 150.0–400.0)
RBC: 4.45 Mil/uL (ref 3.87–5.11)
RDW: 13 % (ref 11.5–15.5)
WBC: 9.5 10*3/uL (ref 4.0–10.5)

## 2018-05-04 LAB — LIPID PANEL
Cholesterol: 171 mg/dL (ref 0–200)
HDL: 72.3 mg/dL (ref >39.00)
LDL Cholesterol: 83 mg/dL (ref 0–99)
NonHDL: 98.24
Total CHOL/HDL Ratio: 2
Triglycerides: 74 mg/dL (ref 0.0–149.0)
VLDL: 14.8 mg/dL (ref 0.0–40.0)

## 2018-05-04 LAB — TSH: TSH: 0.53 u[IU]/mL (ref 0.35–4.50)

## 2018-05-04 LAB — T4, FREE: Free T4: 1.17 ng/dL (ref 0.60–1.60)

## 2018-05-04 LAB — HEMOGLOBIN A1C: Hgb A1c MFr Bld: 5.7 % (ref 4.6–6.5)

## 2018-05-13 DIAGNOSIS — M25461 Effusion, right knee: Secondary | ICD-10-CM | POA: Diagnosis not present

## 2018-05-13 DIAGNOSIS — M9902 Segmental and somatic dysfunction of thoracic region: Secondary | ICD-10-CM | POA: Diagnosis not present

## 2018-05-13 DIAGNOSIS — S43491A Other sprain of right shoulder joint, initial encounter: Secondary | ICD-10-CM | POA: Diagnosis not present

## 2018-05-13 DIAGNOSIS — M545 Low back pain: Secondary | ICD-10-CM | POA: Diagnosis not present

## 2018-05-13 DIAGNOSIS — M9903 Segmental and somatic dysfunction of lumbar region: Secondary | ICD-10-CM | POA: Diagnosis not present

## 2018-05-13 DIAGNOSIS — M546 Pain in thoracic spine: Secondary | ICD-10-CM | POA: Diagnosis not present

## 2018-05-13 DIAGNOSIS — M7500 Adhesive capsulitis of unspecified shoulder: Secondary | ICD-10-CM | POA: Diagnosis not present

## 2018-05-17 DIAGNOSIS — M9903 Segmental and somatic dysfunction of lumbar region: Secondary | ICD-10-CM | POA: Diagnosis not present

## 2018-05-17 DIAGNOSIS — M545 Low back pain: Secondary | ICD-10-CM | POA: Diagnosis not present

## 2018-05-17 DIAGNOSIS — M7500 Adhesive capsulitis of unspecified shoulder: Secondary | ICD-10-CM | POA: Diagnosis not present

## 2018-05-17 DIAGNOSIS — S43491A Other sprain of right shoulder joint, initial encounter: Secondary | ICD-10-CM | POA: Diagnosis not present

## 2018-05-17 DIAGNOSIS — M546 Pain in thoracic spine: Secondary | ICD-10-CM | POA: Diagnosis not present

## 2018-05-17 DIAGNOSIS — M9902 Segmental and somatic dysfunction of thoracic region: Secondary | ICD-10-CM | POA: Diagnosis not present

## 2018-05-17 DIAGNOSIS — M25461 Effusion, right knee: Secondary | ICD-10-CM | POA: Diagnosis not present

## 2018-05-19 DIAGNOSIS — M546 Pain in thoracic spine: Secondary | ICD-10-CM | POA: Diagnosis not present

## 2018-05-19 DIAGNOSIS — S43491A Other sprain of right shoulder joint, initial encounter: Secondary | ICD-10-CM | POA: Diagnosis not present

## 2018-05-19 DIAGNOSIS — M9902 Segmental and somatic dysfunction of thoracic region: Secondary | ICD-10-CM | POA: Diagnosis not present

## 2018-05-19 DIAGNOSIS — M7500 Adhesive capsulitis of unspecified shoulder: Secondary | ICD-10-CM | POA: Diagnosis not present

## 2018-05-19 DIAGNOSIS — M545 Low back pain: Secondary | ICD-10-CM | POA: Diagnosis not present

## 2018-05-19 DIAGNOSIS — M25461 Effusion, right knee: Secondary | ICD-10-CM | POA: Diagnosis not present

## 2018-05-19 DIAGNOSIS — M9903 Segmental and somatic dysfunction of lumbar region: Secondary | ICD-10-CM | POA: Diagnosis not present

## 2018-05-24 DIAGNOSIS — S43491A Other sprain of right shoulder joint, initial encounter: Secondary | ICD-10-CM | POA: Diagnosis not present

## 2018-05-24 DIAGNOSIS — M546 Pain in thoracic spine: Secondary | ICD-10-CM | POA: Diagnosis not present

## 2018-05-24 DIAGNOSIS — M9903 Segmental and somatic dysfunction of lumbar region: Secondary | ICD-10-CM | POA: Diagnosis not present

## 2018-05-24 DIAGNOSIS — M9902 Segmental and somatic dysfunction of thoracic region: Secondary | ICD-10-CM | POA: Diagnosis not present

## 2018-05-24 DIAGNOSIS — M545 Low back pain: Secondary | ICD-10-CM | POA: Diagnosis not present

## 2018-05-24 DIAGNOSIS — M25461 Effusion, right knee: Secondary | ICD-10-CM | POA: Diagnosis not present

## 2018-05-24 DIAGNOSIS — M7500 Adhesive capsulitis of unspecified shoulder: Secondary | ICD-10-CM | POA: Diagnosis not present

## 2018-05-26 DIAGNOSIS — M546 Pain in thoracic spine: Secondary | ICD-10-CM | POA: Diagnosis not present

## 2018-05-26 DIAGNOSIS — M25461 Effusion, right knee: Secondary | ICD-10-CM | POA: Diagnosis not present

## 2018-05-26 DIAGNOSIS — M545 Low back pain: Secondary | ICD-10-CM | POA: Diagnosis not present

## 2018-05-26 DIAGNOSIS — S43491A Other sprain of right shoulder joint, initial encounter: Secondary | ICD-10-CM | POA: Diagnosis not present

## 2018-05-26 DIAGNOSIS — M7500 Adhesive capsulitis of unspecified shoulder: Secondary | ICD-10-CM | POA: Diagnosis not present

## 2018-05-26 DIAGNOSIS — M9903 Segmental and somatic dysfunction of lumbar region: Secondary | ICD-10-CM | POA: Diagnosis not present

## 2018-05-26 DIAGNOSIS — M9902 Segmental and somatic dysfunction of thoracic region: Secondary | ICD-10-CM | POA: Diagnosis not present

## 2018-05-31 DIAGNOSIS — S43491A Other sprain of right shoulder joint, initial encounter: Secondary | ICD-10-CM | POA: Diagnosis not present

## 2018-05-31 DIAGNOSIS — M546 Pain in thoracic spine: Secondary | ICD-10-CM | POA: Diagnosis not present

## 2018-05-31 DIAGNOSIS — M25461 Effusion, right knee: Secondary | ICD-10-CM | POA: Diagnosis not present

## 2018-05-31 DIAGNOSIS — M9902 Segmental and somatic dysfunction of thoracic region: Secondary | ICD-10-CM | POA: Diagnosis not present

## 2018-05-31 DIAGNOSIS — M545 Low back pain: Secondary | ICD-10-CM | POA: Diagnosis not present

## 2018-05-31 DIAGNOSIS — M7500 Adhesive capsulitis of unspecified shoulder: Secondary | ICD-10-CM | POA: Diagnosis not present

## 2018-05-31 DIAGNOSIS — M9903 Segmental and somatic dysfunction of lumbar region: Secondary | ICD-10-CM | POA: Diagnosis not present

## 2018-06-02 DIAGNOSIS — M9903 Segmental and somatic dysfunction of lumbar region: Secondary | ICD-10-CM | POA: Diagnosis not present

## 2018-06-02 DIAGNOSIS — M546 Pain in thoracic spine: Secondary | ICD-10-CM | POA: Diagnosis not present

## 2018-06-02 DIAGNOSIS — M545 Low back pain: Secondary | ICD-10-CM | POA: Diagnosis not present

## 2018-06-02 DIAGNOSIS — S43491A Other sprain of right shoulder joint, initial encounter: Secondary | ICD-10-CM | POA: Diagnosis not present

## 2018-06-02 DIAGNOSIS — M25461 Effusion, right knee: Secondary | ICD-10-CM | POA: Diagnosis not present

## 2018-06-02 DIAGNOSIS — M7500 Adhesive capsulitis of unspecified shoulder: Secondary | ICD-10-CM | POA: Diagnosis not present

## 2018-06-02 DIAGNOSIS — M9902 Segmental and somatic dysfunction of thoracic region: Secondary | ICD-10-CM | POA: Diagnosis not present

## 2018-06-07 DIAGNOSIS — M25461 Effusion, right knee: Secondary | ICD-10-CM | POA: Diagnosis not present

## 2018-06-07 DIAGNOSIS — M546 Pain in thoracic spine: Secondary | ICD-10-CM | POA: Diagnosis not present

## 2018-06-07 DIAGNOSIS — D225 Melanocytic nevi of trunk: Secondary | ICD-10-CM | POA: Diagnosis not present

## 2018-06-07 DIAGNOSIS — M7500 Adhesive capsulitis of unspecified shoulder: Secondary | ICD-10-CM | POA: Diagnosis not present

## 2018-06-07 DIAGNOSIS — M9902 Segmental and somatic dysfunction of thoracic region: Secondary | ICD-10-CM | POA: Diagnosis not present

## 2018-06-07 DIAGNOSIS — M9903 Segmental and somatic dysfunction of lumbar region: Secondary | ICD-10-CM | POA: Diagnosis not present

## 2018-06-07 DIAGNOSIS — M545 Low back pain: Secondary | ICD-10-CM | POA: Diagnosis not present

## 2018-06-07 DIAGNOSIS — L821 Other seborrheic keratosis: Secondary | ICD-10-CM | POA: Diagnosis not present

## 2018-06-07 DIAGNOSIS — S43491A Other sprain of right shoulder joint, initial encounter: Secondary | ICD-10-CM | POA: Diagnosis not present

## 2018-06-09 DIAGNOSIS — M25461 Effusion, right knee: Secondary | ICD-10-CM | POA: Diagnosis not present

## 2018-06-09 DIAGNOSIS — M9903 Segmental and somatic dysfunction of lumbar region: Secondary | ICD-10-CM | POA: Diagnosis not present

## 2018-06-09 DIAGNOSIS — S43491A Other sprain of right shoulder joint, initial encounter: Secondary | ICD-10-CM | POA: Diagnosis not present

## 2018-06-09 DIAGNOSIS — M7500 Adhesive capsulitis of unspecified shoulder: Secondary | ICD-10-CM | POA: Diagnosis not present

## 2018-06-09 DIAGNOSIS — M9902 Segmental and somatic dysfunction of thoracic region: Secondary | ICD-10-CM | POA: Diagnosis not present

## 2018-06-09 DIAGNOSIS — M546 Pain in thoracic spine: Secondary | ICD-10-CM | POA: Diagnosis not present

## 2018-06-09 DIAGNOSIS — M545 Low back pain: Secondary | ICD-10-CM | POA: Diagnosis not present

## 2018-06-17 DIAGNOSIS — M546 Pain in thoracic spine: Secondary | ICD-10-CM | POA: Diagnosis not present

## 2018-06-17 DIAGNOSIS — M7500 Adhesive capsulitis of unspecified shoulder: Secondary | ICD-10-CM | POA: Diagnosis not present

## 2018-06-17 DIAGNOSIS — M545 Low back pain: Secondary | ICD-10-CM | POA: Diagnosis not present

## 2018-06-17 DIAGNOSIS — S43491A Other sprain of right shoulder joint, initial encounter: Secondary | ICD-10-CM | POA: Diagnosis not present

## 2018-06-17 DIAGNOSIS — M25461 Effusion, right knee: Secondary | ICD-10-CM | POA: Diagnosis not present

## 2018-06-17 DIAGNOSIS — M9903 Segmental and somatic dysfunction of lumbar region: Secondary | ICD-10-CM | POA: Diagnosis not present

## 2018-06-17 DIAGNOSIS — M9902 Segmental and somatic dysfunction of thoracic region: Secondary | ICD-10-CM | POA: Diagnosis not present

## 2018-06-28 ENCOUNTER — Other Ambulatory Visit: Payer: Self-pay | Admitting: Family Medicine

## 2018-06-30 ENCOUNTER — Ambulatory Visit (INDEPENDENT_AMBULATORY_CARE_PROVIDER_SITE_OTHER): Payer: Medicare Other | Admitting: Sports Medicine

## 2018-06-30 ENCOUNTER — Encounter: Payer: Self-pay | Admitting: Sports Medicine

## 2018-06-30 ENCOUNTER — Ambulatory Visit: Payer: Self-pay

## 2018-06-30 ENCOUNTER — Ambulatory Visit: Payer: 59 | Admitting: Family Medicine

## 2018-06-30 VITALS — BP 160/80 | HR 113 | Ht 64.0 in | Wt 191.2 lb

## 2018-06-30 DIAGNOSIS — R29898 Other symptoms and signs involving the musculoskeletal system: Secondary | ICD-10-CM | POA: Diagnosis not present

## 2018-06-30 DIAGNOSIS — M25561 Pain in right knee: Secondary | ICD-10-CM

## 2018-06-30 DIAGNOSIS — G8929 Other chronic pain: Secondary | ICD-10-CM

## 2018-06-30 NOTE — Procedures (Signed)
PROCEDURE NOTE:  Ultrasound Guided: Injection: Right knee Images were obtained and interpreted by myself, Teresa Coombs, DO  Images have been saved and stored to PACS system. Images obtained on: GE S7 Ultrasound machine    ULTRASOUND FINDINGS:  There is a bulge of the medial meniscus with minimal osteophytic spurring of the medial joint line.  No appreciable Baker's cyst on ultrasound.  Extensor mechanism intact.  DESCRIPTION OF PROCEDURE:  The patient's clinical condition is marked by substantial pain and/or significant functional disability. Other conservative therapy has not provided relief, is contraindicated, or not appropriate. There is a reasonable likelihood that injection will significantly improve the patient's pain and/or functional impairment.   After discussing the risks, benefits and expected outcomes of the injection and all questions were reviewed and answered, the patient wished to undergo the above named procedure.  Verbal consent was obtained.  The ultrasound was used to identify the target structure and adjacent neurovascular structures. The skin was then prepped in sterile fashion and the target structure was injected under direct visualization using sterile technique as below:  Single injection performed as below: PREP: Alcohol and Ethel Chloride APPROACH:superiolateral, single injection, 25g 1.5 in. INJECTATE: 2 cc 0.5% Marcaine and 2 cc 40mg /mL DepoMedrol ASPIRATE: None DRESSING: Band-Aid  Post procedural instructions including recommending icing and warning signs for infection were reviewed.    This procedure was well tolerated and there were no complications.   IMPRESSION: Succesful Ultrasound Guided: Injection

## 2018-06-30 NOTE — Patient Instructions (Addendum)

## 2018-06-30 NOTE — Progress Notes (Signed)
Robin Rice. [place], [place] at [place]  Robin Rice - 68 y.o. female MRN [id number]  Date of birth: [birth year]  Visit Date: June 30, 2018  PCP: Briscoe Deutscher, DO   Referred by: Briscoe Deutscher, DO  SUBJECTIVE:  Chief Complaint  Patient presents with  . New Patient (Initial Visit)    R knee pain.  [place] pt.  Has tried prednisone dose pack and Aleve.    HPI: Patient presents with 3 months of worsening right knee pain.  She describes it as a constant toothache that is in the posterior aspect of the knee.  It ranges from mild to severe symptoms seem to be intermittent and worse with going up and down steps.  She is tried a prednisone Dosepak as well as Aleve with moderate improvement with each of these.  She denies any catching or popping but does have some weakness with occasional buckling but no giving way fully.  She has not had any falls.  Question presence of a Baker's cyst.  REVIEW OF SYSTEMS: No significant nighttime awakenings due to this issue. Denies fevers, chills, recent weight gain or weight loss.  No night sweats.  Pt denies any change in bowel or bladder habits, muscle weakness, numbness or falls associated with this pain. Otherwise 12 point review of systems performed and is negative   HISTORY:  Prior history reviewed and updated per electronic medical record.  Patient Active Problem List   Diagnosis Date Noted  . Stiffness of finger joint of right hand 11/30/2017  . Fracture of middle phalanx of finger 11/12/2017  . Hypothyroid   . Morbid obesity ([place]) 08/04/2016    .   Marland Kitchen Simple goiter 09/20/2015  . Osteoarthritis, knee 09/20/2015  . Type 2 diabetes mellitus without complications ([place]) 83/38/2505  . Cardiac murmur 09/20/2015  . Hypertension associated with diabetes ([place])   . Hyperlipidemia associated with type 2 diabetes mellitus ([place])   . Glaucoma, left eye    Social History    Occupational History  . 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  . Occupation: Retired    Fish farm manager: [employer]  Tobacco Use  . Smoking status: Never Smoker  . Smokeless tobacco: Never Used  Substance and Sexual Activity  . Alcohol use: Yes    Alcohol/week: 1.0 standard drinks    Types: 1 Glasses of wine per week  . Drug use: No  . Sexual activity: Yes    Partners: Male    Birth control/protection: Surgical    Comment: TAH   Social History   Social History Narrative  . Not on file   Past Medical History:  Diagnosis Date  . DM (diabetes mellitus) ([place])   . Fibroadenoma   . Glaucoma, Left Eye   . Goiter   . Hyperlipidemia   . Hypertension   . Hypothyroid    Past Surgical History:  Procedure Laterality Date  . ABDOMINAL HYSTERECTOMY  2001  . ABDOMINOPLASTY    . CATARACT EXTRACTION W/ INTRAOCULAR LENS  IMPLANT, BILATERAL     family history includes Heart disease in her mother; Hypertension in her brother, brother, and father. There is no history of Thyroid disease or Diabetes.  OBJECTIVE:  VS:  HT:5\' 4"  (162.6 cm)   WT:191 lb 3.2 oz (86.7 kg)  BMI:32.8    BP:(!) 160/80  HR:(!) 113bpm  TEMP: ( )  RESP:98 %   PHYSICAL EXAM: CONSTITUTIONAL: Well-developed, Well-nourished and  In no acute distress EYES: Pupils are equal., EOM intact without nystagmus. and No scleral icterus. Psychiatric: Alert & appropriately interactive. and Not depressed or anxious appearing. EXTREMITY EXAM: Warm and well perfused  Right knee is overall well aligned without significant deformity.  She has no significant effusion.  Mild to moderate synovitis.  Fullness within the popliteal space but no appreciable Baker's cyst.  Ligamentously she is stable.  Negative patellar grind.  She does have pain with Thessaly but a normal McMurray's.  Hip abduction strength is diminished with TFL predominant recruitment pattern  ASSESSMENT:   1. Chronic pain of right knee   2.  Weakness of right hip     PROCEDURES:  US Guided Injection per procedure note    PROCEDURE NOTE: THERAPEUTIC EXERCISES (73419)   Discussed the foundation of treatment for this condition is physical therapy and/or daily (5-6 days/week) therapeutic exercises, focusing on core strengthening, coordination, neuromuscular control/reeducation. 15 minutes spent for Therapeutic exercises as below and as referenced in the AVS. This included exercises focusing on stretching, strengthening, with significant focus on eccentric aspects.  Proper technique shown and discussed handout in great detail with ATC. All questions were discussed and answered.   Long term goals include an improvement in range of motion, strength, endurance as well as avoiding reinjury. Frequency of visits is one time as determined during today's office visit. Frequency of exercises to be performed is as per handout.  EXERCISES REVIEWED:   Hip ABduction strengthening with focus on Glute Medius Recruitment VMO Strengthening      PLAN:  Pertinent additional documentation may be included in corresponding procedure notes, imaging studies, problem based documentation and patient instructions.  No problem-specific Assessment & Plan notes found for this encounter.   This does seem consistent with a degenerative medial meniscus likely slightly posterior.  This should respond well to therapeutic exercises as well as injection therapy.  Home Therapeutic exercises prescribed today per procedure note.  Activity modifications and the importance of avoiding exacerbating activities (limiting pain to no more than a 4 / 10 during or following activity) recommended and discussed.  Discussed red flag symptoms that warrant earlier emergent evaluation and patient voices understanding.   No orders of the defined types were placed in this encounter.  Lab Orders  No laboratory test(s) ordered today    Imaging Orders     [place] [facility] POCT  ULTRASOUND Referral Orders  No referral(s) requested today    Return in about 6 weeks (around 08/11/2018).  If any lack of improvement diagnostic evaluation with x-rays and/or MRI can be pursued.  Suspect mild osteoarthritis at [place], [place] Physician

## 2018-07-07 ENCOUNTER — Other Ambulatory Visit: Payer: Self-pay | Admitting: Family Medicine

## 2018-07-07 DIAGNOSIS — E782 Mixed hyperlipidemia: Secondary | ICD-10-CM

## 2018-07-29 ENCOUNTER — Other Ambulatory Visit: Payer: Self-pay | Admitting: Family Medicine

## 2018-07-29 DIAGNOSIS — E1159 Type 2 diabetes mellitus with other circulatory complications: Secondary | ICD-10-CM

## 2018-07-29 DIAGNOSIS — I10 Essential (primary) hypertension: Principal | ICD-10-CM

## 2018-07-30 ENCOUNTER — Ambulatory Visit (INDEPENDENT_AMBULATORY_CARE_PROVIDER_SITE_OTHER): Payer: Medicare Other | Admitting: Family Medicine

## 2018-07-30 ENCOUNTER — Other Ambulatory Visit: Payer: Self-pay

## 2018-07-30 ENCOUNTER — Encounter: Payer: Self-pay | Admitting: Family Medicine

## 2018-07-30 DIAGNOSIS — E785 Hyperlipidemia, unspecified: Secondary | ICD-10-CM | POA: Diagnosis not present

## 2018-07-30 DIAGNOSIS — E119 Type 2 diabetes mellitus without complications: Secondary | ICD-10-CM | POA: Diagnosis not present

## 2018-07-30 DIAGNOSIS — E1169 Type 2 diabetes mellitus with other specified complication: Secondary | ICD-10-CM | POA: Diagnosis not present

## 2018-07-30 DIAGNOSIS — E1159 Type 2 diabetes mellitus with other circulatory complications: Secondary | ICD-10-CM

## 2018-07-30 DIAGNOSIS — I10 Essential (primary) hypertension: Secondary | ICD-10-CM

## 2018-07-30 DIAGNOSIS — I152 Hypertension secondary to endocrine disorders: Secondary | ICD-10-CM

## 2018-07-30 NOTE — Progress Notes (Signed)
Virtual Visit via Video   I connected with Robin Rice on 07/30/18 at 11:40 AM EDT by a video enabled telemedicine application and verified that I am speaking with the correct person using two identifiers. Location patient: Home Location provider: [facility], Office Persons participating in the virtual visit: RENDA Rice, Briscoe Deutscher, DO [name], CMA acting as scribe for Dr. Briscoe Deutscher.    I discussed the limitations of evaluation and management by telemedicine and the availability of in person appointments. The patient expressed understanding and agreed to proceed.  Subjective:   HPI:  No labs due at this time. We have reviewed all the precautions for the Covid-19. She has been staying home. Patient encouraged to out side and move around a little bit.  Hypothyroid  Current symptoms: increased anxiety thinks due to news . Patient denies change in energy level. Symptoms have stabilized. Lab Results  Component Value Date   TSH 0.53 05/04/2018   TSH 0.78 10/30/2017   TSH 0.79 04/30/2017   Lab Results  Component Value Date   FREET4 1.17 05/04/2018   FREET4 1.13 10/30/2017     HTN  Review: taking medications as instructed, no medication side effects noted, no TIAs, no chest pain on exertion, no dyspnea on exertion, no swelling of ankles. Smoker: No. taking medications daily. No problems or issues.   BP Readings from Last 3 Encounters:  07/30/18 134/71  06/30/18 (!) 160/80  05/04/18 140/70   Lab Results  Component Value Date   CREATININE 0.95 05/04/2018   CREATININE 0.82 10/30/2017   CREATININE 0.91 04/30/2017     Type 2 diabetes mellitus without complication, without long-term current use of insulin ([place]) Taking all medications with no problems or issues. We will do some labs in about three months.    ROS: See pertinent positives and negatives per HPI.  Patient Active Problem List   Diagnosis Date Noted  . Stiffness of finger joint of right  hand 11/30/2017  . Fracture of middle phalanx of finger 11/12/2017  . Hypothyroid   . Morbid obesity ([place]) 08/04/2016  . Simple goiter 09/20/2015  . Osteoarthritis, knee 09/20/2015  . Type 2 diabetes mellitus without complications ([place]) 63/05/6008  . Cardiac murmur 09/20/2015  . Hypertension associated with diabetes ([place])   . Hyperlipidemia associated with type 2 diabetes mellitus ([place])   . Glaucoma, left eye     Social History   Tobacco Use  . Smoking status: Never Smoker  . Smokeless tobacco: Never Used  Substance Use Topics  . Alcohol use: Yes    Alcohol/week: 1.0 standard drinks    Types: 1 Glasses of wine per week   Current Outpatient Medications:  .  amLODipine (NORVASC) 10 MG tablet, TAKE 1 TABLET BY MOUTH EVERY DAY, Disp: 90 tablet, Rfl: 1 .  aspirin 81 MG tablet, Take 81 mg by mouth daily., Disp: , Rfl:  .  atorvastatin (LIPITOR) 20 MG tablet, TAKE 1 TABLET BY MOUTH EVERY DAY, Disp: 90 tablet, Rfl: 1 .  cholecalciferol (VITAMIN D) 1000 units tablet, Take 1,000 Units by mouth daily., Disp: , Rfl:  .  Dulaglutide (TRULICITY) 1.5 XN/2.3FT SOPN, Inject 1.5 mg into the skin once a week., Disp: 12 pen, Rfl: 11 .  glucose blood (ONETOUCH VERIO) test strip, 1 each by Other route once a week. And lancets 1/day, Disp: 30 each, Rfl: 1 .  Lancets (ONETOUCH ULTRASOFT) lancets, Used to check blood sugars daily. DX CODE E11.9., Disp: 100 each, Rfl: 12 .  levothyroxine (SYNTHROID, LEVOTHROID) 50 MCG tablet, TAKE 1 TABLET (50 MCG TOTAL) BY MOUTH DAILY., Disp: 90 tablet, Rfl: 1 .  lisinopril-hydrochlorothiazide (PRINZIDE,ZESTORETIC) 20-25 MG tablet, TAKE 1 TABLET BY MOUTH EVERY DAY, Disp: 90 tablet, Rfl: 1  Allergies  Allergen Reactions  . Other     Objective:   VITALS: Per patient if applicable, see vitals. GENERAL: Alert, appears well and in no acute distress. HEENT: Atraumatic, conjunctiva clear, no obvious abnormalities on inspection of external nose and ears. NECK: Normal  movements of the head and neck. CARDIOPULMONARY: No increased WOB. Speaking in clear sentences. I:E ratio WNL.  MS: Moves all visible extremities without noticeable abnormality. PSYCH: Pleasant and cooperative, well-groomed. Speech normal rate and rhythm. Affect is appropriate. Insight and judgement are appropriate. Attention is focused, linear, and appropriate.  NEURO: CN grossly intact. Oriented as arrived to appointment on time with no prompting. Moves both UE equally.  SKIN: No obvious lesions, wounds, erythema, or cyanosis noted on face or hands.  Assessment and Plan:   [name] was seen today for follow-up, hypothyroidism, hyperlipidemia, hypertension and diabetes. She is doing very well. See below. All controlled. Continue current treatment.   Diagnoses and all orders for this visit:  Morbid obesity ([place])  Type 2 diabetes mellitus without complication, without long-term current use of insulin ([place])  Hypertension associated with diabetes ([place])  Hyperlipidemia associated with type 2 diabetes mellitus ([place])   . Reviewed expectations Robin: course of current medical issues. . Discussed self-management of symptoms. . Outlined signs and symptoms indicating need for more acute intervention. . Patient verbalized understanding and all questions were answered. Marland Kitchen Health Maintenance issues including appropriate healthy diet, exercise, and smoking avoidance were discussed with patient. . See orders for this visit as documented in the electronic medical record.  Briscoe Deutscher, DO 07/30/2018

## 2018-08-03 ENCOUNTER — Ambulatory Visit: Payer: 59 | Admitting: Family Medicine

## 2018-08-04 ENCOUNTER — Other Ambulatory Visit: Payer: Self-pay | Admitting: Endocrinology

## 2018-08-04 NOTE — Telephone Encounter (Signed)
Please refill x 1 Ov is due  

## 2018-08-04 NOTE — Telephone Encounter (Signed)
This has been ordered by Dr. Juleen China should I refill this please advise

## 2018-08-17 ENCOUNTER — Ambulatory Visit: Payer: 59 | Admitting: Sports Medicine

## 2018-10-04 ENCOUNTER — Other Ambulatory Visit: Payer: Self-pay

## 2018-10-04 ENCOUNTER — Encounter: Payer: Self-pay | Admitting: Obstetrics and Gynecology

## 2018-10-04 ENCOUNTER — Ambulatory Visit (INDEPENDENT_AMBULATORY_CARE_PROVIDER_SITE_OTHER): Payer: Medicare Other | Admitting: Obstetrics and Gynecology

## 2018-10-04 VITALS — BP 150/68 | HR 100 | Temp 98.1°F | Ht 64.0 in | Wt 192.2 lb

## 2018-10-04 DIAGNOSIS — Z01419 Encounter for gynecological examination (general) (routine) without abnormal findings: Secondary | ICD-10-CM

## 2018-10-04 DIAGNOSIS — Z124 Encounter for screening for malignant neoplasm of cervix: Secondary | ICD-10-CM

## 2018-10-04 NOTE — Progress Notes (Signed)
68 y.o. G13P2002 Married Black or [place] American Not Hispanic or Latino female here for annual exam.  No medical changes. Sexually active, no pain.     Patient's last menstrual period was 05/06/1999.          Sexually active: Yes.    The current method of family planning is status post hysterectomy.     Exercising: No.  The patient does not participate in regular exercise at present. Smoker:  no  Health Maintenance: Pap:  11-13-08 WNL  History of abnormal Pap:  no MMG:  12/30/2017 Birads 1 negative Colonoscopy:  2014 normal  BMD:   12/30/2017 WNL TDaP:  02/01/2018 Gardasil: N/A   reports that she has never smoked. She has never used smokeless tobacco. She reports current alcohol use of about 1.0 standard drinks of alcohol per week. She reports that she does not use drugs. 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 3 grand kids are here, 2 in [place].  Past Medical History:  Diagnosis Date  . DM (diabetes mellitus) ([place])   . Fibroadenoma   . Glaucoma, Left Eye   . Goiter   . Hyperlipidemia   . Hypertension   . Hypothyroid     Past Surgical History:  Procedure Laterality Date  . ABDOMINAL HYSTERECTOMY  2001  . ABDOMINOPLASTY    . CATARACT EXTRACTION W/ INTRAOCULAR LENS  IMPLANT, BILATERAL      Current Outpatient Medications  Medication Sig Dispense Refill  . amLODipine (NORVASC) 10 MG tablet TAKE 1 TABLET BY MOUTH EVERY DAY 90 tablet 1  . aspirin 81 MG tablet Take 81 mg by mouth daily.    Marland Kitchen atorvastatin (LIPITOR) 20 MG tablet TAKE 1 TABLET BY MOUTH EVERY DAY 90 tablet 1  . Cholecalciferol (VITAMIN D3) 125 MCG (5000 UT) TABS Take by mouth.    Marland Kitchen glucose blood (ONETOUCH VERIO) test strip 1 each by Other route once a week. And lancets 1/day 30 each 1  . Lancets (ONETOUCH ULTRASOFT) lancets Used to check blood sugars daily. DX CODE E11.9. 100 each 12  . levothyroxine (SYNTHROID, LEVOTHROID) 50 MCG tablet TAKE 1 TABLET  (50 MCG TOTAL) BY MOUTH DAILY. 90 tablet 1  . lisinopril-hydrochlorothiazide (PRINZIDE,ZESTORETIC) 20-25 MG tablet TAKE 1 TABLET BY MOUTH EVERY DAY 90 tablet 1  . TRULICITY 1.5 AL/9.3XT SOPN INJECT 1.5 MG INTO THE SKIN ONCE A WEEK. 2 pen 0   No current facility-administered medications for this visit.     Family History  Problem Relation Age of Onset  . Heart disease Mother   . Hypertension Father   . Prostate cancer Father   . Hypertension Brother   . Hypertension Brother   . Thyroid disease Neg Hx   . Diabetes Neg Hx     Review of Systems  Constitutional: Negative.   HENT: Negative.   Eyes: Negative.   Respiratory: Negative.   Cardiovascular: Negative.   Gastrointestinal: Negative.   Endocrine: Negative.   Genitourinary: Negative.   Musculoskeletal: Negative.   Skin: Negative.   Allergic/Immunologic: Negative.   Neurological: Negative.   Hematological: Negative.   Psychiatric/Behavioral: Negative.     Exam:   BP (!) 150/68 (BP Location: Right Arm, Patient Position: Sitting, Cuff Size: Large)   Pulse 100   Temp 98.1 F (36.7 C) (Skin)   Ht 5\' 4"  (1.626 m)   Wt 192 lb 3.2 oz (87.2 kg)   LMP 05/06/1999   BMI 32.99 kg/m  Weight change: @WEIGHTCHANGE @ Height:   Height: 5\' 4"  (162.6 cm)  Ht Readings from Last 3 Encounters:  10/04/18 5\' 4"  (1.626 m)  07/30/18 5\' 4"  (1.626 m)  06/30/18 5\' 4"  (1.626 m)    General appearance: alert, cooperative and appears stated age Head: Normocephalic, without obvious abnormality, atraumatic Neck: no adenopathy, supple, symmetrical, trachea midline and thyroid normal to inspection and palpation Lungs: clear to auscultation bilaterally, 3/6 SEM Cardiovascular: regular rate and rhythm Breasts: normal appearance, no masses or tenderness Abdomen: soft, non-tender; non distended,  no masses,  no organomegaly Extremities: extremities normal, atraumatic, no cyanosis or edema Skin: Skin color, texture, turgor normal. No rashes or  lesions Lymph nodes: Cervical, supraclavicular, and axillary nodes normal. No abnormal inguinal nodes palpated Neurologic: Grossly normal   Pelvic: External genitalia:  no lesions              Urethra:  normal appearing urethra with no masses, tenderness or lesions              Bartholins and Skenes: normal                 Vagina: normal appearing vagina with normal color and discharge, no lesions              Cervix: absent               Bimanual Exam:  Uterus:  uterus absent              Adnexa: no mass, fullness, tenderness               Rectovaginal: Confirms               Anus:  normal sphincter tone, no lesions  Chaperone was present for exam.  A:  Well Woman with normal exam  P:   Mammogram this summer  Discussed breast self exam  Discussed calcium and vit D intake  Labs with primary  DEXA normal

## 2018-10-04 NOTE — Patient Instructions (Signed)
EXERCISE AND DIET:  We recommended that you start or continue a regular exercise program for good health. Regular exercise means any activity that makes your heart beat faster and makes you sweat.  We recommend exercising at least 30 minutes per day at least 3 days a week, preferably 4 or 5.  We also recommend a diet low in fat and sugar.  Inactivity, poor dietary choices and obesity can cause diabetes, heart attack, stroke, and kidney damage, among others.    ALCOHOL AND SMOKING:  Women should limit their alcohol intake to no more than 7 drinks/beers/glasses of wine (combined, not each!) per week. Moderation of alcohol intake to this level decreases your risk of breast cancer and liver damage. And of course, no recreational drugs are part of a healthy lifestyle.  And absolutely no smoking or even second hand smoke. Most people know smoking can cause heart and lung diseases, but did you know it also contributes to weakening of your bones? Aging of your skin?  Yellowing of your teeth and nails?  CALCIUM AND VITAMIN D:  Adequate intake of calcium and Vitamin D are recommended.  The recommendations for exact amounts of these supplements seem to change often, but generally speaking 1,200 mg of calcium (between diet and supplement) and 800 units of Vitamin D per day seems prudent. Certain women may benefit from higher intake of Vitamin D.  If you are among these women, your doctor will have told you during your visit.    PAP SMEARS:  Pap smears, to check for cervical cancer or precancers,  have traditionally been done yearly, although recent scientific advances have shown that most women can have pap smears less often.  However, every woman still should have a physical exam from her gynecologist every year. It will include a breast check, inspection of the vulva and vagina to check for abnormal growths or skin changes, a visual exam of the cervix, and then an exam to evaluate the size and shape of the uterus and  ovaries.  And after 67 years of age, a rectal exam is indicated to check for rectal cancers. We will also provide age appropriate advice regarding health maintenance, like when you should have certain vaccines, screening for sexually transmitted diseases, bone density testing, colonoscopy, mammograms, etc.   MAMMOGRAMS:  All women over 40 years old should have a yearly mammogram. Many facilities now offer a "3D" mammogram, which may cost around $50 extra out of pocket. If possible,  we recommend you accept the option to have the 3D mammogram performed.  It both reduces the number of women who will be called back for extra views which then turn out to be normal, and it is better than the routine mammogram at detecting truly abnormal areas.    COLON CANCER SCREENING: Now recommend starting at age 45. At this time colonoscopy is not covered for routine screening until 50. There are take home tests that can be done between 45-49.   COLONOSCOPY:  Colonoscopy to screen for colon cancer is recommended for all women at age 50.  We know, you hate the idea of the prep.  We agree, BUT, having colon cancer and not knowing it is worse!!  Colon cancer so often starts as a polyp that can be seen and removed at colonscopy, which can quite literally save your life!  And if your first colonoscopy is normal and you have no family history of colon cancer, most women don't have to have it again for   10 years.  Once every ten years, you can do something that may end up saving your life, right?  We will be happy to help you get it scheduled when you are ready.  Be sure to check your insurance coverage so you understand how much it will cost.  It may be covered as a preventative service at no cost, but you should check your particular policy.      Breast Self-Awareness Breast self-awareness means being familiar with how your breasts look and feel. It involves checking your breasts regularly and reporting any changes to your  health care provider. Practicing breast self-awareness is important. A change in your breasts can be a sign of a serious medical problem. Being familiar with how your breasts look and feel allows you to find any problems early, when treatment is more likely to be successful. All women should practice breast self-awareness, including women who have had breast implants. How to do a breast self-exam One way to learn what is normal for your breasts and whether your breasts are changing is to do a breast self-exam. To do a breast self-exam: Look for Changes  1. Remove all the clothing above your waist. 2. Stand in front of a mirror in a room with good lighting. 3. Put your hands on your hips. 4. Push your hands firmly downward. 5. Compare your breasts in the mirror. Look for differences between them (asymmetry), such as: ? Differences in shape. ? Differences in size. ? Puckers, dips, and bumps in one breast and not the other. 6. Look at each breast for changes in your skin, such as: ? Redness. ? Scaly areas. 7. Look for changes in your nipples, such as: ? Discharge. ? Bleeding. ? Dimpling. ? Redness. ? A change in position. Feel for Changes Carefully feel your breasts for lumps and changes. It is best to do this while lying on your back on the floor and again while sitting or standing in the shower or tub with soapy water on your skin. Feel each breast in the following way:  Place the arm on the side of the breast you are examining above your head.  Feel your breast with the other hand.  Start in the nipple area and make  inch (2 cm) overlapping circles to feel your breast. Use the pads of your three middle fingers to do this. Apply light pressure, then medium pressure, then firm pressure. The light pressure will allow you to feel the tissue closest to the skin. The medium pressure will allow you to feel the tissue that is a little deeper. The firm pressure will allow you to feel the tissue  close to the ribs.  Continue the overlapping circles, moving downward over the breast until you feel your ribs below your breast.  Move one finger-width toward the center of the body. Continue to use the  inch (2 cm) overlapping circles to feel your breast as you move slowly up toward your collarbone.  Continue the up and down exam using all three pressures until you reach your armpit.  Write Down What You Find  Write down what is normal for each breast and any changes that you find. Keep a written record with breast changes or normal findings for each breast. By writing this information down, you do not need to depend only on memory for size, tenderness, or location. Write down where you are in your menstrual cycle, if you are still menstruating. If you are having trouble noticing differences   in your breasts, do not get discouraged. With time you will become more familiar with the variations in your breasts and more comfortable with the exam. How often should I examine my breasts? Examine your breasts every month. If you are breastfeeding, the best time to examine your breasts is after a feeding or after using a breast pump. If you menstruate, the best time to examine your breasts is 5-7 days after your period is over. During your period, your breasts are lumpier, and it may be more difficult to notice changes. When should I see my health care provider? See your health care provider if you notice:  A change in shape or size of your breasts or nipples.  A change in the skin of your breast or nipples, such as a reddened or scaly area.  Unusual discharge from your nipples.  A lump or thick area that was not there before.  Pain in your breasts.  Anything that concerns you.  

## 2018-10-22 ENCOUNTER — Telehealth: Payer: Self-pay | Admitting: Physical Therapy

## 2018-10-22 NOTE — Telephone Encounter (Signed)
Copied from [place] [phone]. Topic: Appointment Scheduling - Scheduling Inquiry for Clinic >> Oct 22, 2018 11:40 AM Nils Flack wrote: Reason for CRM: husband returning call, pt has appt on Monday  -please call cell [phone]

## 2018-10-25 ENCOUNTER — Ambulatory Visit (INDEPENDENT_AMBULATORY_CARE_PROVIDER_SITE_OTHER): Payer: Medicare Other | Admitting: Family Medicine

## 2018-10-25 ENCOUNTER — Encounter: Payer: Self-pay | Admitting: Family Medicine

## 2018-10-25 ENCOUNTER — Other Ambulatory Visit: Payer: Self-pay

## 2018-10-25 VITALS — BP 140/78 | HR 97 | Temp 97.6°F | Ht 64.0 in | Wt 191.2 lb

## 2018-10-25 DIAGNOSIS — Z0000 Encounter for general adult medical examination without abnormal findings: Secondary | ICD-10-CM

## 2018-10-25 DIAGNOSIS — R5383 Other fatigue: Secondary | ICD-10-CM

## 2018-10-25 DIAGNOSIS — E559 Vitamin D deficiency, unspecified: Secondary | ICD-10-CM

## 2018-10-25 DIAGNOSIS — E039 Hypothyroidism, unspecified: Secondary | ICD-10-CM | POA: Diagnosis not present

## 2018-10-25 DIAGNOSIS — E66811 Obesity, class 1: Secondary | ICD-10-CM

## 2018-10-25 DIAGNOSIS — E1169 Type 2 diabetes mellitus with other specified complication: Secondary | ICD-10-CM

## 2018-10-25 DIAGNOSIS — E119 Type 2 diabetes mellitus without complications: Secondary | ICD-10-CM | POA: Diagnosis not present

## 2018-10-25 DIAGNOSIS — I152 Hypertension secondary to endocrine disorders: Secondary | ICD-10-CM

## 2018-10-25 DIAGNOSIS — D649 Anemia, unspecified: Secondary | ICD-10-CM | POA: Diagnosis not present

## 2018-10-25 DIAGNOSIS — E1159 Type 2 diabetes mellitus with other circulatory complications: Secondary | ICD-10-CM

## 2018-10-25 DIAGNOSIS — E785 Hyperlipidemia, unspecified: Secondary | ICD-10-CM

## 2018-10-25 DIAGNOSIS — E669 Obesity, unspecified: Secondary | ICD-10-CM

## 2018-10-25 DIAGNOSIS — Z79899 Other long term (current) drug therapy: Secondary | ICD-10-CM | POA: Diagnosis not present

## 2018-10-25 LAB — COMPREHENSIVE METABOLIC PANEL
ALT: 13 U/L (ref 0–35)
AST: 17 U/L (ref 0–37)
Albumin: 4.4 g/dL (ref 3.5–5.2)
Alkaline Phosphatase: 94 U/L (ref 39–117)
BUN: 16 mg/dL (ref 6–23)
CO2: 30 mEq/L (ref 19–32)
Calcium: 9.7 mg/dL (ref 8.4–10.5)
Chloride: 102 mEq/L (ref 96–112)
Creatinine, Ser: 0.84 mg/dL (ref 0.40–1.20)
GFR: 81.94 mL/min (ref >60.00)
Glucose, Bld: 96 mg/dL (ref 70–99)
Potassium: 3.9 mEq/L (ref 3.5–5.1)
Sodium: 140 mEq/L (ref 135–145)
Total Bilirubin: 0.6 mg/dL (ref 0.2–1.2)
Total Protein: 7.1 g/dL (ref 6.0–8.3)

## 2018-10-25 LAB — HEMOGLOBIN A1C: Hgb A1c MFr Bld: 5.8 % (ref 4.6–6.5)

## 2018-10-25 LAB — CBC WITH DIFFERENTIAL/PLATELET
Basophils Absolute: 0.1 10*3/uL (ref 0.0–0.1)
Basophils Relative: 1.2 % (ref 0.0–3.0)
Eosinophils Absolute: 0.2 10*3/uL (ref 0.0–0.7)
Eosinophils Relative: 1.7 % (ref 0.0–5.0)
HCT: 41 % (ref 36.0–46.0)
Hemoglobin: 13.6 g/dL (ref 12.0–15.0)
Lymphocytes Relative: 30.4 % (ref 12.0–46.0)
Lymphs Abs: 2.9 10*3/uL (ref 0.7–4.0)
MCHC: 33.1 g/dL (ref 30.0–36.0)
MCV: 88.5 fl (ref 78.0–100.0)
Monocytes Absolute: 0.4 10*3/uL (ref 0.1–1.0)
Monocytes Relative: 4.7 % (ref 3.0–12.0)
Neutro Abs: 5.8 10*3/uL (ref 1.4–7.7)
Neutrophils Relative %: 62 % (ref 43.0–77.0)
Platelets: 183 10*3/uL (ref 150.0–400.0)
RBC: 4.63 Mil/uL (ref 3.87–5.11)
RDW: 13 % (ref 11.5–15.5)
WBC: 9.4 10*3/uL (ref 4.0–10.5)

## 2018-10-25 LAB — LIPID PANEL
Cholesterol: 161 mg/dL (ref 0–200)
HDL: 72 mg/dL (ref >39.00)
LDL Cholesterol: 73 mg/dL (ref 0–99)
NonHDL: 88.91
Total CHOL/HDL Ratio: 2
Triglycerides: 81 mg/dL (ref 0.0–149.0)
VLDL: 16.2 mg/dL (ref 0.0–40.0)

## 2018-10-25 LAB — VITAMIN B12: Vitamin B-12: 251 pg/mL (ref 211–911)

## 2018-10-25 LAB — VITAMIN D 25 HYDROXY (VIT D DEFICIENCY, FRACTURES): VITD: 76.1 ng/mL (ref 30.00–100.00)

## 2018-10-25 LAB — TSH: TSH: 1.25 u[IU]/mL (ref 0.35–4.50)

## 2018-10-25 NOTE — Progress Notes (Signed)
Subjective:    Robin Rice is a 68 y.o. female who presents for Medicare Annual (Subsequent) preventive examination.  We are also following up on her chronic medical illnesses.  1. Hyperlipidemia associated with type 2 diabetes mellitus ([place]) Is the patient taking medications without problems? Yes. Does the patient complain of muscle aches?  No. Trying to exercise on a regular basis? Yes. Compliant with diet? Yes.  Lab Results  Component Value Date   CHOL 161 10/25/2018   HDL 72.00 10/25/2018   LDLCALC 73 10/25/2018   TRIG 81.0 10/25/2018   CHOLHDL 2 10/25/2018   Lab Results  Component Value Date   ALT 13 10/25/2018   AST 17 10/25/2018   ALKPHOS 94 10/25/2018   BILITOT 0.6 10/25/2018     2. Hypertension associated with diabetes ([place]) Review: taking medications as instructed, no medication side effects noted, no TIAs, no chest pain on exertion, no dyspnea on exertion, no swelling of ankles. Smoker: No.   BP Readings from Last 3 Encounters:  10/25/18 140/78  10/04/18 (!) 150/68  07/30/18 134/71   Lab Results  Component Value Date   CREATININE 0.84 10/25/2018   CREATININE 0.95 05/04/2018   CREATININE 0.82 10/30/2017     3. Acquired hypothyroidism  Symptoms have stabilized  . Lab Results  Component Value Date   TSH 1.25 10/25/2018   TSH 0.53 05/04/2018   TSH 0.78 10/30/2017   Lab Results  Component Value Date   FREET4 1.17 05/04/2018   FREET4 1.13 10/30/2017     4. Type 2 diabetes mellitus without complication, without long-term current use of insulin ([facility]) Medication compliance: compliant all of the time, diabetic diet compliance: compliant most of the time, home glucose monitoring: is not performed, further diabetic ROS: no polyuria or polydipsia, no chest pain, dyspnea or TIA's, no numbness, tingling or pain in extremities.  9. Obesity (BMI 30.0-34.9) Wt Readings from Last 3 Encounters:  10/25/18 191 lb 4 oz (86.8 kg)  10/04/18 192 lb 3.2 oz (87.2  kg)  07/30/18 189 lb 3.2 oz (85.8 kg)   Review of Systems  Constitutional: Negative for chills, fever, malaise/fatigue and weight loss.  Respiratory: Negative for cough, shortness of breath and wheezing.   Cardiovascular: Negative for chest pain, palpitations and leg swelling.  Gastrointestinal: Negative for abdominal pain, constipation, diarrhea, nausea and vomiting.  Genitourinary: Negative for dysuria and urgency.  Musculoskeletal: Negative for joint pain and myalgias.  Skin: Negative for rash.  Neurological: Negative for dizziness and headaches.  Psychiatric/Behavioral: Negative for depression, substance abuse and suicidal ideas. The patient is not nervous/anxious.    Objective:   Vitals: BP 140/78 (BP Location: Left Arm, Patient Position: Sitting, Cuff Size: Large)   Pulse 97   Temp 97.6 F (36.4 C) (Oral)   Ht 5\' 4"  (1.626 m)   Wt 191 lb 4 oz (86.8 kg)   LMP 05/06/1999   SpO2 97%   BMI 32.83 kg/m   Body mass index is 32.83 kg/m.  Physical Exam Vitals signs and nursing note reviewed.  HENT:     Head: Normocephalic and atraumatic.  Eyes:     Pupils: Pupils are equal, round, and reactive to light.  Neck:     Musculoskeletal: Normal range of motion and neck supple.  Cardiovascular:     Rate and Rhythm: Normal rate and regular rhythm.     Heart sounds: Normal heart sounds.  Pulmonary:     Effort: Pulmonary effort is normal.  Abdominal:  Palpations: Abdomen is soft.  Skin:    General: Skin is warm.  Psychiatric:        Behavior: Behavior normal.    Social History   Tobacco Use  Smoking Status Never Smoker  Smokeless Tobacco Never Used     Patient Active Problem List   Diagnosis Date Noted  . Stiffness of finger joint of right hand 11/30/2017  . Fracture of middle phalanx of finger 11/12/2017  . Hypothyroid   . Morbid obesity ([place]) 08/04/2016  . Simple goiter 09/20/2015  . Osteoarthritis, knee 09/20/2015  . Type 2 diabetes mellitus without  complications ([place]) 78/93/8101  . Cardiac murmur 09/20/2015  . Hypertension associated with diabetes ([place])   . Hyperlipidemia associated with type 2 diabetes mellitus ([place])   . Glaucoma, left eye    Past Surgical History:  Procedure Laterality Date  . ABDOMINAL HYSTERECTOMY  2001  . ABDOMINOPLASTY    . CATARACT EXTRACTION W/ INTRAOCULAR LENS  IMPLANT, BILATERAL     Family History  Problem Relation Age of Onset  . Heart disease Mother   . Hypertension Father   . Prostate cancer Father   . Hypertension Brother   . Hypertension Brother   . Thyroid disease Neg Hx   . Diabetes Neg Hx    Social History   Tobacco Use  . Smoking status: Never Smoker  . Smokeless tobacco: Never Used  Substance Use Topics  . Alcohol use: Yes    Alcohol/week: 1.0 standard drinks    Types: 1 Glasses of wine per week  . Drug use: No    Current Outpatient Medications:  .  amLODipine (NORVASC) 10 MG tablet, TAKE 1 TABLET BY MOUTH EVERY DAY, Disp: 90 tablet, Rfl: 1 .  aspirin 81 MG tablet, Take 81 mg by mouth daily., Disp: , Rfl:  .  atorvastatin (LIPITOR) 20 MG tablet, TAKE 1 TABLET BY MOUTH EVERY DAY, Disp: 90 tablet, Rfl: 1 .  Cholecalciferol (VITAMIN D3) 125 MCG (5000 UT) TABS, Take by mouth., Disp: , Rfl:  .  glucose blood (ONETOUCH VERIO) test strip, 1 each by Other route once a week. And lancets 1/day, Disp: 30 each, Rfl: 1 .  Lancets (ONETOUCH ULTRASOFT) lancets, Used to check blood sugars daily. DX CODE E11.9., Disp: 100 each, Rfl: 12 .  levothyroxine (SYNTHROID, LEVOTHROID) 50 MCG tablet, TAKE 1 TABLET (50 MCG TOTAL) BY MOUTH DAILY., Disp: 90 tablet, Rfl: 1 .  lisinopril-hydrochlorothiazide (PRINZIDE,ZESTORETIC) 20-25 MG tablet, TAKE 1 TABLET BY MOUTH EVERY DAY, Disp: 90 tablet, Rfl: 1 .  TRULICITY 1.5 BP/1.0CH SOPN, INJECT 1.5 MG INTO THE SKIN ONCE A WEEK., Disp: 2 pen, Rfl: 0  Activities of Daily Living In your present state of health, do you have any difficulty performing the following  activities: 10/25/2018  Hearing? N  Vision? N  Difficulty concentrating or making decisions? N  Walking or climbing stairs? N  Dressing or bathing? N  Doing errands, shopping? N  Preparing Food and eating ? N  Using the Toilet? N  In the past six months, have you accidently leaked urine? N  Do you have problems with loss of bowel control? N  Managing your Medications? N  Managing your Finances? N  Housekeeping or managing your Housekeeping? N  Some recent data might be hidden   Patient Care Team: Briscoe Deutscher, DO as PCP - General (Family Medicine) Salvadore Dom, MD as Consulting Physician (Obstetrics and Gynecology) Renato Shin, MD as Consulting Physician (Endocrinology)   Assessment:  This is  a routine wellness examination for Jeannifer.  Exercise Activities and Dietary recommendations Current Exercise Habits: Home exercise routine, Type of exercise: Other - see comments(work around home), Intensity: Mild, Exercise limited by: None identified  Fall Risk Fall Risk  10/25/2018 05/04/2018 02/01/2018 09/15/2017 08/04/2016  Falls in the past year? 0 0 No No No  Number falls in past yr: - 0 - - -  Injury with Fall? - 0 - - -   Timed Get Up and Go performed: yes, taking < 12 seconds Note: If takes > 12 seconds to complete, patient at increased risk of falling. Note if patient has slow pace, loss of balance, short strides, little or no arm swings, steadying self on walls, shuffling, not using assist device properly.   Depression Screen PHQ 2/9 Scores 10/25/2018 02/01/2018 09/15/2017 08/04/2016  PHQ - 2 Score 0 0 0 0  PHQ- 9 Score - 0 - -    Cognitive Function [place] performed and passed without concerns.   Immunization History  Administered Date(s) Administered  . Influenza, High Dose Seasonal PF 02/01/2018  . Influenza,inj,Quad PF,6+ Mos 01/06/2017  . Influenza-Unspecified 02/15/2015, 01/07/2016  . Pneumococcal Conjugate-13 02/15/2015  . Pneumococcal Polysaccharide-23  09/15/2017  . Tdap 05/18/2007, 02/01/2018  . Zoster 10/07/2012  . Zoster Recombinat (Shingrix) 10/31/2017, 02/08/2018   Screening Tests Health Maintenance  Topic Date Due  . FOOT EXAM  09/30/2018  . OPHTHALMOLOGY EXAM  10/27/2018  . INFLUENZA VACCINE  12/04/2018  . HEMOGLOBIN A1C  04/26/2019  . MAMMOGRAM  12/31/2019  . COLONOSCOPY  12/16/2022  . TETANUS/TDAP  02/02/2028  . DEXA SCAN  Completed  . Hepatitis C Screening  Completed  . PNA vac Low Risk Adult  Completed   Plan:   PLEASE NOTE THAT ALL TESTS AND QUESTIONS WERE COMPLETED ON DAY OF VISIT. THIS MAY NOT REFLECT DATE OF SERVICE IF INFORMATION ENTERED ON SUBSEQUENT DAY.  I have personally reviewed and noted the following in the patient's chart:   . Medical and social history . Use of alcohol, tobacco or illicit drugs  . Current medications and supplements . Functional ability and status . Nutritional status . Physical activity . Advanced directives . List of other physicians . Hospitalizations, surgeries, and ER visits in previous 12 months . Vitals . Screenings to include cognitive, depression, and falls . Referrals and appointments  In addition, I have reviewed and discussed with patient certain preventive protocols, quality metrics, and best practice recommendations. A written personalized care plan for preventive services as well as general preventive health recommendations were provided to patient. [facility] QUESTIONS ANSWERED AND SCANNED INTO CHART UNDER MEDIA SECTION.  [place] Deutscher, DO

## 2018-10-26 LAB — IRON,TIBC AND FERRITIN PANEL
%SAT: 25 % (calc) (ref 16–45)
Ferritin: 146 ng/mL (ref 16–288)
Iron: 77 ug/dL (ref 45–160)
TIBC: 307 mcg/dL (calc) (ref 250–450)

## 2018-10-27 ENCOUNTER — Encounter: Payer: Self-pay | Admitting: Family Medicine

## 2018-10-28 DIAGNOSIS — Z961 Presence of intraocular lens: Secondary | ICD-10-CM | POA: Diagnosis not present

## 2018-10-28 DIAGNOSIS — H40033 Anatomical narrow angle, bilateral: Secondary | ICD-10-CM | POA: Diagnosis not present

## 2018-11-02 ENCOUNTER — Telehealth: Payer: Self-pay | Admitting: Family Medicine

## 2018-11-02 NOTE — Telephone Encounter (Signed)
10/26/18 = 126/57 135/70 121/68 129/69 136/69 125/69 147/74. Each blood pressure reading was done starting at 9am consecutively .

## 2018-12-01 DIAGNOSIS — R011 Cardiac murmur, unspecified: Secondary | ICD-10-CM | POA: Diagnosis not present

## 2018-12-01 DIAGNOSIS — R635 Abnormal weight gain: Secondary | ICD-10-CM | POA: Diagnosis not present

## 2018-12-01 DIAGNOSIS — N951 Menopausal and female climacteric states: Secondary | ICD-10-CM | POA: Diagnosis not present

## 2018-12-01 DIAGNOSIS — E039 Hypothyroidism, unspecified: Secondary | ICD-10-CM | POA: Diagnosis not present

## 2018-12-02 ENCOUNTER — Other Ambulatory Visit: Payer: Self-pay

## 2018-12-02 DIAGNOSIS — Z20822 Contact with and (suspected) exposure to covid-19: Secondary | ICD-10-CM

## 2018-12-02 DIAGNOSIS — R6889 Other general symptoms and signs: Secondary | ICD-10-CM | POA: Diagnosis not present

## 2018-12-04 LAB — NOVEL CORONAVIRUS, NAA: SARS-CoV-2, NAA: NOT DETECTED

## 2018-12-07 DIAGNOSIS — Z1339 Encounter for screening examination for other mental health and behavioral disorders: Secondary | ICD-10-CM | POA: Diagnosis not present

## 2018-12-07 DIAGNOSIS — E782 Mixed hyperlipidemia: Secondary | ICD-10-CM | POA: Diagnosis not present

## 2018-12-07 DIAGNOSIS — E039 Hypothyroidism, unspecified: Secondary | ICD-10-CM | POA: Diagnosis not present

## 2018-12-07 DIAGNOSIS — N951 Menopausal and female climacteric states: Secondary | ICD-10-CM | POA: Diagnosis not present

## 2018-12-07 DIAGNOSIS — Z6832 Body mass index (BMI) 32.0-32.9, adult: Secondary | ICD-10-CM | POA: Diagnosis not present

## 2018-12-07 DIAGNOSIS — R6882 Decreased libido: Secondary | ICD-10-CM | POA: Diagnosis not present

## 2018-12-07 DIAGNOSIS — Z1331 Encounter for screening for depression: Secondary | ICD-10-CM | POA: Diagnosis not present

## 2018-12-07 DIAGNOSIS — I10 Essential (primary) hypertension: Secondary | ICD-10-CM | POA: Diagnosis not present

## 2018-12-07 DIAGNOSIS — R7303 Prediabetes: Secondary | ICD-10-CM | POA: Diagnosis not present

## 2018-12-07 DIAGNOSIS — E663 Overweight: Secondary | ICD-10-CM | POA: Diagnosis not present

## 2018-12-09 DIAGNOSIS — E782 Mixed hyperlipidemia: Secondary | ICD-10-CM | POA: Diagnosis not present

## 2018-12-09 DIAGNOSIS — Z6832 Body mass index (BMI) 32.0-32.9, adult: Secondary | ICD-10-CM | POA: Diagnosis not present

## 2018-12-16 DIAGNOSIS — Z6832 Body mass index (BMI) 32.0-32.9, adult: Secondary | ICD-10-CM | POA: Diagnosis not present

## 2018-12-16 DIAGNOSIS — R7303 Prediabetes: Secondary | ICD-10-CM | POA: Diagnosis not present

## 2018-12-21 ENCOUNTER — Other Ambulatory Visit: Payer: Self-pay | Admitting: Family Medicine

## 2018-12-21 NOTE — Telephone Encounter (Signed)
Last fill 06/28/18  #90/1 Last OV 10/25/18

## 2018-12-23 DIAGNOSIS — Z6831 Body mass index (BMI) 31.0-31.9, adult: Secondary | ICD-10-CM | POA: Diagnosis not present

## 2018-12-23 DIAGNOSIS — I10 Essential (primary) hypertension: Secondary | ICD-10-CM | POA: Diagnosis not present

## 2018-12-30 ENCOUNTER — Other Ambulatory Visit: Payer: Self-pay | Admitting: Family Medicine

## 2018-12-30 DIAGNOSIS — E782 Mixed hyperlipidemia: Secondary | ICD-10-CM

## 2019-01-03 DIAGNOSIS — K5900 Constipation, unspecified: Secondary | ICD-10-CM | POA: Diagnosis not present

## 2019-01-03 DIAGNOSIS — I10 Essential (primary) hypertension: Secondary | ICD-10-CM | POA: Diagnosis not present

## 2019-01-03 DIAGNOSIS — Z6831 Body mass index (BMI) 31.0-31.9, adult: Secondary | ICD-10-CM | POA: Diagnosis not present

## 2019-01-03 DIAGNOSIS — R7303 Prediabetes: Secondary | ICD-10-CM | POA: Diagnosis not present

## 2019-01-03 DIAGNOSIS — E782 Mixed hyperlipidemia: Secondary | ICD-10-CM | POA: Diagnosis not present

## 2019-01-06 DIAGNOSIS — Z23 Encounter for immunization: Secondary | ICD-10-CM | POA: Diagnosis not present

## 2019-01-13 DIAGNOSIS — Z1231 Encounter for screening mammogram for malignant neoplasm of breast: Secondary | ICD-10-CM | POA: Diagnosis not present

## 2019-01-13 DIAGNOSIS — E782 Mixed hyperlipidemia: Secondary | ICD-10-CM | POA: Diagnosis not present

## 2019-01-13 DIAGNOSIS — Z6831 Body mass index (BMI) 31.0-31.9, adult: Secondary | ICD-10-CM | POA: Diagnosis not present

## 2019-01-13 LAB — HM MAMMOGRAPHY

## 2019-01-17 ENCOUNTER — Other Ambulatory Visit: Payer: Self-pay | Admitting: Family Medicine

## 2019-01-17 DIAGNOSIS — I152 Hypertension secondary to endocrine disorders: Secondary | ICD-10-CM

## 2019-01-17 DIAGNOSIS — E1159 Type 2 diabetes mellitus with other circulatory complications: Secondary | ICD-10-CM

## 2019-01-20 DIAGNOSIS — R7303 Prediabetes: Secondary | ICD-10-CM | POA: Diagnosis not present

## 2019-01-20 DIAGNOSIS — Z6831 Body mass index (BMI) 31.0-31.9, adult: Secondary | ICD-10-CM | POA: Diagnosis not present

## 2019-01-26 DIAGNOSIS — Z713 Dietary counseling and surveillance: Secondary | ICD-10-CM | POA: Diagnosis not present

## 2019-01-26 DIAGNOSIS — E782 Mixed hyperlipidemia: Secondary | ICD-10-CM | POA: Diagnosis not present

## 2019-01-26 DIAGNOSIS — Z6830 Body mass index (BMI) 30.0-30.9, adult: Secondary | ICD-10-CM | POA: Diagnosis not present

## 2019-02-02 DIAGNOSIS — I10 Essential (primary) hypertension: Secondary | ICD-10-CM | POA: Diagnosis not present

## 2019-02-02 DIAGNOSIS — Z6830 Body mass index (BMI) 30.0-30.9, adult: Secondary | ICD-10-CM | POA: Diagnosis not present

## 2019-02-10 DIAGNOSIS — E039 Hypothyroidism, unspecified: Secondary | ICD-10-CM | POA: Diagnosis not present

## 2019-02-10 DIAGNOSIS — Z6830 Body mass index (BMI) 30.0-30.9, adult: Secondary | ICD-10-CM | POA: Diagnosis not present

## 2019-02-17 DIAGNOSIS — K5900 Constipation, unspecified: Secondary | ICD-10-CM | POA: Diagnosis not present

## 2019-02-17 DIAGNOSIS — R7303 Prediabetes: Secondary | ICD-10-CM | POA: Diagnosis not present

## 2019-02-17 DIAGNOSIS — Z6830 Body mass index (BMI) 30.0-30.9, adult: Secondary | ICD-10-CM | POA: Diagnosis not present

## 2019-02-24 DIAGNOSIS — Z6830 Body mass index (BMI) 30.0-30.9, adult: Secondary | ICD-10-CM | POA: Diagnosis not present

## 2019-02-24 DIAGNOSIS — E8881 Metabolic syndrome: Secondary | ICD-10-CM | POA: Diagnosis not present

## 2019-03-03 DIAGNOSIS — E782 Mixed hyperlipidemia: Secondary | ICD-10-CM | POA: Diagnosis not present

## 2019-03-03 DIAGNOSIS — Z6829 Body mass index (BMI) 29.0-29.9, adult: Secondary | ICD-10-CM | POA: Diagnosis not present

## 2019-03-03 DIAGNOSIS — I10 Essential (primary) hypertension: Secondary | ICD-10-CM | POA: Diagnosis not present

## 2019-03-10 DIAGNOSIS — I10 Essential (primary) hypertension: Secondary | ICD-10-CM | POA: Diagnosis not present

## 2019-03-10 DIAGNOSIS — E782 Mixed hyperlipidemia: Secondary | ICD-10-CM | POA: Diagnosis not present

## 2019-03-10 DIAGNOSIS — R7303 Prediabetes: Secondary | ICD-10-CM | POA: Diagnosis not present

## 2019-03-10 DIAGNOSIS — R635 Abnormal weight gain: Secondary | ICD-10-CM | POA: Diagnosis not present

## 2019-03-10 DIAGNOSIS — Z6829 Body mass index (BMI) 29.0-29.9, adult: Secondary | ICD-10-CM | POA: Diagnosis not present

## 2019-03-10 DIAGNOSIS — E039 Hypothyroidism, unspecified: Secondary | ICD-10-CM | POA: Diagnosis not present

## 2019-03-24 DIAGNOSIS — R7303 Prediabetes: Secondary | ICD-10-CM | POA: Diagnosis not present

## 2019-03-24 DIAGNOSIS — Z6828 Body mass index (BMI) 28.0-28.9, adult: Secondary | ICD-10-CM | POA: Diagnosis not present

## 2019-03-30 ENCOUNTER — Telehealth: Payer: Self-pay | Admitting: Family Medicine

## 2019-03-30 NOTE — Telephone Encounter (Signed)
See note

## 2019-03-30 NOTE — Telephone Encounter (Signed)
Advised patient of message, patient verbalized understanding.

## 2019-03-30 NOTE — Telephone Encounter (Addendum)
Pt is calling and has an appt with dr Rogers Blocker on 05/16/2019. Pt needs a letter for unemployment office stating she is high risk for corona virus and has pre-existing conditions she is  prediabetes  and has high blood pressure. Pt would like to pick up letter asap

## 2019-03-30 NOTE — Telephone Encounter (Signed)
Please see messages below and advise. Thanks. 

## 2019-03-30 NOTE — Telephone Encounter (Signed)
Patient has never seen Dr. Rogers Blocker, and she is out of the office until 12/2.  Can forward this message to her to see if she is willing to write letter for patient after reviewing her chart upon her return; no guarantee that she will be do it.  Left vm message for patient requesting a c/b.

## 2019-04-03 NOTE — Telephone Encounter (Signed)
Can you get calrification on what type of note this is for? I do not do unemployment notes and am only doing notes for working from home.. Also,  pre diabetes is not a diagnosis I will include for covid risks.  Thanks Dr. Rogers Blocker   .

## 2019-04-05 NOTE — Telephone Encounter (Signed)
Spoke to patient.  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  Advised patient that it would be available via her MyChart; she verbalized understanding.

## 2019-04-07 DIAGNOSIS — E8881 Metabolic syndrome: Secondary | ICD-10-CM | POA: Diagnosis not present

## 2019-04-07 DIAGNOSIS — Z6829 Body mass index (BMI) 29.0-29.9, adult: Secondary | ICD-10-CM | POA: Diagnosis not present

## 2019-04-07 DIAGNOSIS — E782 Mixed hyperlipidemia: Secondary | ICD-10-CM | POA: Diagnosis not present

## 2019-04-07 NOTE — Telephone Encounter (Signed)
Letter done.  Orma Flaming, MD [place]

## 2019-05-04 ENCOUNTER — Other Ambulatory Visit: Payer: Self-pay | Admitting: Endocrinology

## 2019-05-10 ENCOUNTER — Telehealth: Payer: Self-pay | Admitting: Family Medicine

## 2019-05-10 ENCOUNTER — Other Ambulatory Visit: Payer: Self-pay

## 2019-05-10 MED ORDER — TRULICITY 1.5 MG/0.5ML ~~LOC~~ SOAJ: SUBCUTANEOUS | 0 refills | Status: DC

## 2019-05-10 NOTE — Telephone Encounter (Signed)
  LAST APPOINTMENT DATE: @@LASTENCT @  NEXT APPOINTMENT DATE:@1 /03/2020  MEDICATION: TRULICITY 1.5 0000000 SOPN  PHARMACY:  [id number] [address] [place] Phone:  [phone]  Fax:  [phone]       **Let patient know to contact pharmacy at the end of the day to make sure medication is ready. **  ** Please notify patient to allow 48-72 hours to process**  **Encourage patient to contact the pharmacy for refills or they can request refills through [facility]**  CLINICAL FILLS OUT ALL BELOW:   LAST REFILL:  QTY:  REFILL DATE:    OTHER COMMENTS:    Okay for refill?  Please advise

## 2019-05-10 NOTE — Telephone Encounter (Signed)
Rx refill for [facility] [place]

## 2019-05-11 ENCOUNTER — Telehealth: Payer: Self-pay | Admitting: Family Medicine

## 2019-05-11 ENCOUNTER — Telehealth: Payer: Self-pay

## 2019-05-11 MED ORDER — TRULICITY 1.5 MG/0.5ML ~~LOC~~ SOAJ: SUBCUTANEOUS | 1 refills | Status: DC

## 2019-05-11 NOTE — Telephone Encounter (Signed)
Patient called in saying we sent in her prescription for Trulicity but when she picked it up, it was only a 30 day supply but said her insurance only covers only for 60 days and that she will have to pay out of pocket for this supply, and asked if we could change it.

## 2019-05-11 NOTE — Telephone Encounter (Signed)
[facility] [place] and authorized a 60 day supply of Trulicity for patient.

## 2019-05-11 NOTE — Telephone Encounter (Signed)
Erroneous encounter-disregard

## 2019-05-11 NOTE — Addendum Note (Signed)
Addended by: Orma Flaming on: 05/11/2019 04:41 PM   Modules accepted: Orders

## 2019-05-12 ENCOUNTER — Telehealth: Payer: Self-pay | Admitting: Family Medicine

## 2019-05-12 NOTE — Telephone Encounter (Signed)
Sorry, she is needing the 90 day supply instead of 60, she thought insurance would cover more if it was 60 but she will only pay $40 for the 90 day

## 2019-05-12 NOTE — Telephone Encounter (Signed)
Sent in 60 day supply of Trulicity for patient.

## 2019-05-12 NOTE — Telephone Encounter (Signed)
Patient called in saying the pharmacy has the 30 day supply of Trulicity but the patient states if she can get a 90 day supply that her insurance will cover over half of the cost but with it being the 30-60 she will have to pay over a hundred dollars still. Patient has been out for 3 weeks and has an appointment on Monday but wanted to know if she could switch it again.

## 2019-05-13 ENCOUNTER — Other Ambulatory Visit: Payer: Self-pay

## 2019-05-16 ENCOUNTER — Encounter: Payer: Self-pay | Admitting: Family Medicine

## 2019-05-16 ENCOUNTER — Ambulatory Visit (INDEPENDENT_AMBULATORY_CARE_PROVIDER_SITE_OTHER): Payer: Medicare Other | Admitting: Family Medicine

## 2019-05-16 VITALS — BP 160/68 | HR 93 | Temp 97.6°F | Ht 64.0 in | Wt 169.4 lb

## 2019-05-16 DIAGNOSIS — E1159 Type 2 diabetes mellitus with other circulatory complications: Secondary | ICD-10-CM

## 2019-05-16 DIAGNOSIS — E038 Other specified hypothyroidism: Secondary | ICD-10-CM | POA: Diagnosis not present

## 2019-05-16 DIAGNOSIS — I10 Essential (primary) hypertension: Secondary | ICD-10-CM

## 2019-05-16 DIAGNOSIS — E119 Type 2 diabetes mellitus without complications: Secondary | ICD-10-CM | POA: Diagnosis not present

## 2019-05-16 DIAGNOSIS — E782 Mixed hyperlipidemia: Secondary | ICD-10-CM | POA: Diagnosis not present

## 2019-05-16 DIAGNOSIS — I152 Hypertension secondary to endocrine disorders: Secondary | ICD-10-CM

## 2019-05-16 LAB — COMPREHENSIVE METABOLIC PANEL
ALT: 19 U/L (ref 0–35)
AST: 21 U/L (ref 0–37)
Albumin: 4.4 g/dL (ref 3.5–5.2)
Alkaline Phosphatase: 87 U/L (ref 39–117)
BUN: 18 mg/dL (ref 6–23)
CO2: 28 mEq/L (ref 19–32)
Calcium: 10 mg/dL (ref 8.4–10.5)
Chloride: 103 mEq/L (ref 96–112)
Creatinine, Ser: 0.85 mg/dL (ref 0.40–1.20)
GFR: 80.69 mL/min (ref >60.00)
Glucose, Bld: 102 mg/dL — ABNORMAL HIGH (ref 70–99)
Potassium: 4.1 mEq/L (ref 3.5–5.1)
Sodium: 142 mEq/L (ref 135–145)
Total Bilirubin: 0.5 mg/dL (ref 0.2–1.2)
Total Protein: 7.5 g/dL (ref 6.0–8.3)

## 2019-05-16 LAB — CBC WITH DIFFERENTIAL/PLATELET
Basophils Absolute: 0 10*3/uL (ref 0.0–0.1)
Basophils Relative: 0.5 % (ref 0.0–3.0)
Eosinophils Absolute: 0.1 10*3/uL (ref 0.0–0.7)
Eosinophils Relative: 1.7 % (ref 0.0–5.0)
HCT: 38.7 % (ref 36.0–46.0)
Hemoglobin: 12.8 g/dL (ref 12.0–15.0)
Lymphocytes Relative: 29 % (ref 12.0–46.0)
Lymphs Abs: 2.5 10*3/uL (ref 0.7–4.0)
MCHC: 33.1 g/dL (ref 30.0–36.0)
MCV: 87.8 fl (ref 78.0–100.0)
Monocytes Absolute: 0.6 10*3/uL (ref 0.1–1.0)
Monocytes Relative: 6.5 % (ref 3.0–12.0)
Neutro Abs: 5.5 10*3/uL (ref 1.4–7.7)
Neutrophils Relative %: 62.3 % (ref 43.0–77.0)
Platelets: 190 10*3/uL (ref 150.0–400.0)
RBC: 4.41 Mil/uL (ref 3.87–5.11)
RDW: 13.3 % (ref 11.5–15.5)
WBC: 8.8 10*3/uL (ref 4.0–10.5)

## 2019-05-16 LAB — TSH: TSH: 1.33 u[IU]/mL (ref 0.35–4.50)

## 2019-05-16 LAB — HEMOGLOBIN A1C: Hgb A1c MFr Bld: 5.5 % (ref 4.6–6.5)

## 2019-05-16 MED ORDER — ATORVASTATIN CALCIUM 20 MG PO TABS: 20.0000 mg | ORAL_TABLET | Freq: Every day | ORAL | 3 refills | Status: DC

## 2019-05-16 MED ORDER — LEVOTHYROXINE SODIUM 50 MCG PO TABS: 50.0000 ug | ORAL_TABLET | Freq: Every day | ORAL | 3 refills | Status: DC

## 2019-05-16 MED ORDER — AMLODIPINE BESYLATE 10 MG PO TABS: 10.0000 mg | ORAL_TABLET | Freq: Every day | ORAL | 1 refills | Status: DC

## 2019-05-16 MED ORDER — TRULICITY 1.5 MG/0.5ML ~~LOC~~ SOAJ: SUBCUTANEOUS | 1 refills | Status: DC

## 2019-05-16 MED ORDER — LISINOPRIL-HYDROCHLOROTHIAZIDE 20-25 MG PO TABS: 1.0000 | ORAL_TABLET | Freq: Every day | ORAL | 1 refills | Status: DC

## 2019-05-16 NOTE — Patient Instructions (Signed)
So nice to meet you! Refilled everything for 90 days. Checking labs today. Need to see you back in 6 months unless a1c is too high (but I doubt this!). Really proud of your weight loss, we just need to make sure we don't need to adjust if your blood sugar drops.   Let me know if you need anything! Dr. Rogers Blocker

## 2019-05-16 NOTE — Progress Notes (Signed)
Patient: NIGER RASSI MRN: [id number] DOB: 1950/12/11 PCP: Orma Flaming, MD     Subjective:  Chief Complaint  Patient presents with  . Diabetes    HPI: The patient is a 69 y.o. female who presents today for diabetes/establishing care.   Diabetes: Patient is here for follow up of type 2 diabetes. First diagnosed 2018 and was always told she was borderline.  Currently on the following medications-trulicity 1.5mg /week . Takes medications as prescribed. Last A1C was 5.8. Currently exercising and following diabetic diet. Denies any hypoglycemic events. Denies any vision changes, nausea, vomiting, abdominal pain, ulcers/paraesthesia in feet, polyuria, polydipsia or polyphagia. Denies any chest pain, shortness of breath. She has been off her trulicity x 3 weeks.   Hypertension: Here for follow up of hypertension.  Currently on norvasc and lisinopril/hctz. Home readings range from 0000000 [id number] diastolic. Takes medication as prescribed and denies any side effects. Exercise includes walking. Weight has been stable. Denies any chest pain, headaches, shortness of breath, vision changes, swelling in lower extremities. She didn't take her blood pressure medication this AM.   Hypothyroid: currently 106mcg. Takes as prescribed. Denies any thyroid symptoms. Appears euthroid.   Hyperlipidemia: lipids checked in June and to goal. Takes medication as prescribed.   No FH of breast/colon cancer in first degree relative.   Review of Systems  Constitutional: Negative for fatigue.  Eyes: Negative for visual disturbance.  Respiratory: Negative for shortness of breath.   Cardiovascular: Positive for leg swelling. Negative for chest pain and palpitations.  Gastrointestinal: Positive for constipation. Negative for abdominal pain, diarrhea, nausea and vomiting.  Endocrine: Negative for cold intolerance, heat intolerance, polydipsia and polyuria.  Neurological: Negative for dizziness and headaches.   Psychiatric/Behavioral: Negative for sleep disturbance.    Allergies Patient is allergic to other.  Past Medical History Patient  has a past medical history of Blood transfusion without reported diagnosis (2000), Cataract (2012), DM (diabetes mellitus) ([place]), Fibroadenoma, Glaucoma, Left Eye, Goiter, Heart murmur, Hyperlipidemia, Hypertension, Hypothyroid, and Osteoarthritis, knee.  Surgical History Patient  has a past surgical history that includes Abdominal hysterectomy (2001); Abdominoplasty; Cataract extraction w/ intraocular lens  implant, bilateral; Eye surgery (2012); and Fracture surgery (2019).  Family History Pateint's family history includes Heart disease in her mother; Hypertension in her brother, brother, and father; Prostate cancer in her father.  Social History Patient  reports that she has never smoked. She has never used smokeless tobacco. She reports current alcohol use of about 1.0 standard drinks of alcohol per week. She reports that she does not use drugs.    Objective: Vitals:   05/16/19 1050  BP: (!) 160/68  Pulse: 93  Temp: 97.6 F (36.4 C)  TempSrc: Temporal  SpO2: 97%  Weight: 169 lb 6.4 oz (76.8 kg)  Height: 5\' 4"  (1.626 m)    Body mass index is 29.08 kg/m.  Physical Exam Vitals reviewed.  Constitutional:      Appearance: Normal appearance. She is well-developed. She is obese.  HENT:     Head: Normocephalic and atraumatic.     Right Ear: Tympanic membrane, ear canal and external ear normal.     Left Ear: Tympanic membrane, ear canal and external ear normal.     Nose: Nose normal.     Mouth/Throat:     Mouth: Mucous membranes are moist.  Eyes:     Extraocular Movements: Extraocular movements intact.     Conjunctiva/sclera: Conjunctivae normal.     Pupils: Pupils are equal, round, and reactive to light.  Neck:     Thyroid: No thyromegaly.  Cardiovascular:     Rate and Rhythm: Normal rate and regular rhythm.     Heart sounds: Murmur  present.  Pulmonary:     Effort: Pulmonary effort is normal.     Breath sounds: Normal breath sounds.  Abdominal:     General: Abdomen is flat. Bowel sounds are normal. There is no distension.     Palpations: Abdomen is soft.     Tenderness: There is no abdominal tenderness.  Musculoskeletal:     Cervical back: Normal range of motion and neck supple.  Lymphadenopathy:     Cervical: No cervical adenopathy.  Skin:    General: Skin is warm and dry.     Capillary Refill: Capillary refill takes less than 2 seconds.     Findings: No rash.  Neurological:     General: No focal deficit present.     Mental Status: She is alert and oriented to person, place, and time.     Cranial Nerves: No cranial nerve deficit.     Coordination: Coordination normal.     Deep Tendon Reflexes: Reflexes normal.  Psychiatric:        Mood and Affect: Mood normal.        Behavior: Behavior normal.    Depression screen [facility] 2/9 05/16/2019 10/25/2018 02/01/2018 09/15/2017 08/04/2016  Decreased Interest 0 0 0 0 0  Down, Depressed, Hopeless 0 0 0 0 0  PHQ - 2 Score 0 0 0 0 0  Altered sleeping - - 0 - -  Tired, decreased energy - - 0 - -  Change in appetite - - 0 - -  Feeling bad or failure about yourself  - - 0 - -  Trouble concentrating - - 0 - -  Moving slowly or fidgety/restless - - 0 - -  Suicidal thoughts - - 0 - -  PHQ-9 Score - - 0 - -  Difficult doing work/chores - - Not difficult at all - -       Assessment/plan: 1. Hypertension associated with diabetes ([place]) Above goal, but has NOT taken medication. Home readings to goal. Advised to always take medication even when she has to get lab work so I can see what she is running in office with medication. Refilled meds, routine labs and f/u in 6 months.  - CBC with Differential/Platelet - Comprehensive metabolic panel - amLODipine (NORVASC) 10 MG tablet; Take 1 tablet (10 mg total) by mouth daily.  Dispense: 90 tablet; Refill: 1  2. Type 2 diabetes mellitus  without complication, without long-term current use of insulin ([facility]) Very well controlled on last check. Unsure if every diabetic or just pre diabetes. Was changed to trulicity from metformin to help weight and she has noticed it has really helped her weight loss. No hypoglycemia events. Will need to watch sugars if she continue to lose more weight. Refilled meds and discussed f/u every 6 months. Continue exercise and healthy diet.  - Hemoglobin A1c  3. Other specified hypothyroidism  - TSH  4. Mixed hyperlipidemia  - atorvastatin (LIPITOR) 20 MG tablet; Take 1 tablet (20 mg total) by mouth daily.  Dispense: 90 tablet; Refill: 3  -reviewed all of chart/medications/ problems/ HM.   This visit occurred during the SARS-CoV-2 public health emergency.  Safety protocols were in place, including screening questions prior to the visit, additional usage of staff PPE, and extensive cleaning of exam room while observing appropriate contact time as indicated for disinfecting solutions.  Return in about 6 months (around 11/13/2019) for diabetes/fasting labs .   Orma Flaming, MD [place]   05/16/2019

## 2019-05-24 ENCOUNTER — Telehealth: Payer: Self-pay | Admitting: Family Medicine

## 2019-05-25 NOTE — Telephone Encounter (Signed)
error 

## 2019-06-02 DIAGNOSIS — R7303 Prediabetes: Secondary | ICD-10-CM | POA: Diagnosis not present

## 2019-06-02 DIAGNOSIS — Z6828 Body mass index (BMI) 28.0-28.9, adult: Secondary | ICD-10-CM | POA: Diagnosis not present

## 2019-06-04 ENCOUNTER — Ambulatory Visit: Payer: 59

## 2019-06-12 ENCOUNTER — Ambulatory Visit: Payer: Medicare Other | Attending: Internal Medicine

## 2019-06-12 DIAGNOSIS — Z23 Encounter for immunization: Secondary | ICD-10-CM

## 2019-06-12 NOTE — Progress Notes (Signed)
   Covid-19 Vaccination Clinic  Name:  ANNA-MARIE BAUMBACH    MRN: [id number] DOB: [birth year]  06/12/2019  Ms. Chinchilla was observed post Covid-19 immunization for 15 minutes without incidence. She was provided with Vaccine Information Sheet and instruction to access the V-Safe system.   Ms. Gaffney was instructed to call 911 with any severe reactions post vaccine: Marland Kitchen Difficulty breathing  . Swelling of your face and throat  . A fast heartbeat  . A bad rash all over your body  . Dizziness and weakness    Immunizations Administered    Name Date Dose VIS Date Route   Pfizer COVID-19 Vaccine 06/12/2019  8:28 AM 0.3 mL 04/15/2019 Intramuscular   Manufacturer: [place]   Lot: [id number]   [place]: [id number]

## 2019-06-30 DIAGNOSIS — Z6827 Body mass index (BMI) 27.0-27.9, adult: Secondary | ICD-10-CM | POA: Diagnosis not present

## 2019-06-30 DIAGNOSIS — E8881 Metabolic syndrome: Secondary | ICD-10-CM | POA: Diagnosis not present

## 2019-07-07 ENCOUNTER — Ambulatory Visit: Payer: Medicare Other | Attending: Internal Medicine

## 2019-07-07 DIAGNOSIS — Z23 Encounter for immunization: Secondary | ICD-10-CM | POA: Insufficient documentation

## 2019-07-07 NOTE — Progress Notes (Signed)
   Covid-19 Vaccination Clinic  Name:  Robin Rice    MRN: [id number] DOB: Aug 26, 1950  07/07/2019  Ms. [name] was observed post Covid-19 immunization for 15 minutes without incident. She was provided with Vaccine Information Sheet and instruction to access the V-Safe system.   Ms. [name] was instructed to call 911 with any severe reactions post vaccine: Marland Kitchen Difficulty breathing  . Swelling of face and throat  . A fast heartbeat  . A bad rash all over body  . Dizziness and weakness   Immunizations Administered    Name Date Dose VIS Date Route   Pfizer COVID-19 Vaccine 07/07/2019 10:48 AM 0.3 mL 04/15/2019 Intramuscular   Manufacturer: [place]   Lot: [id number]   [place]: [id number]

## 2019-07-28 DIAGNOSIS — Z6827 Body mass index (BMI) 27.0-27.9, adult: Secondary | ICD-10-CM | POA: Diagnosis not present

## 2019-07-28 DIAGNOSIS — I10 Essential (primary) hypertension: Secondary | ICD-10-CM | POA: Diagnosis not present

## 2019-08-08 ENCOUNTER — Other Ambulatory Visit: Payer: Self-pay | Admitting: Family Medicine

## 2019-09-08 DIAGNOSIS — I10 Essential (primary) hypertension: Secondary | ICD-10-CM | POA: Diagnosis not present

## 2019-09-08 DIAGNOSIS — Z6827 Body mass index (BMI) 27.0-27.9, adult: Secondary | ICD-10-CM | POA: Diagnosis not present

## 2019-10-06 DIAGNOSIS — Z6827 Body mass index (BMI) 27.0-27.9, adult: Secondary | ICD-10-CM | POA: Diagnosis not present

## 2019-10-06 DIAGNOSIS — E782 Mixed hyperlipidemia: Secondary | ICD-10-CM | POA: Diagnosis not present

## 2019-10-12 ENCOUNTER — Other Ambulatory Visit: Payer: Self-pay

## 2019-10-12 NOTE — Progress Notes (Signed)
69 y.o. G23P2002 Married Black or [place] American Not Hispanic or Latino female here for annual exam.  Has been going to [facility] and has been loosing weight, down 32 lbs since last year. Still wants to loose 10 lbs.  No vaginal bleeding. No dyspareunia.     Patient's last menstrual period was 05/06/1999.          Sexually active: Yes.    The current method of family planning is post menopausal status.    Exercising: Yes.    walking Smoker:  no  Health Maintenance: Pap:  11/13/08 WNL History of abnormal Pap:  no MMG:  01/13/19 Bi-rads 1 neg  BMD:   12/30/17 normal.  Colonoscopy: 2014 normal  TDaP:  02/01/18 Gardasil: N/A   reports that she has never smoked. She has never used smokeless tobacco. She reports current alcohol use of about 1.0 standard drink of alcohol per week. She reports that she does not use drugs. 3 grand kids are here, 2 in [place]. Retired.   Past Medical History:  Diagnosis Date  . Blood transfusion without reported diagnosis 2000  . Cataract 2012  . DM (diabetes mellitus) ([place])   . Fibroadenoma   . Glaucoma, Left Eye   . Goiter   . Heart murmur   . Hyperlipidemia   . Hypertension   . Hypothyroid   . Osteoarthritis, knee     Past Surgical History:  Procedure Laterality Date  . ABDOMINAL HYSTERECTOMY  2001  . ABDOMINOPLASTY    . CATARACT EXTRACTION W/ INTRAOCULAR LENS  IMPLANT, BILATERAL    . EYE SURGERY  2012  . FRACTURE SURGERY  2019    Current Outpatient Medications  Medication Sig Dispense Refill  . amLODipine (NORVASC) 10 MG tablet Take 1 tablet (10 mg total) by mouth daily. 90 tablet 1  . aspirin 81 MG tablet Take 81 mg by mouth daily.    Marland Kitchen atorvastatin (LIPITOR) 20 MG tablet Take 1 tablet (20 mg total) by mouth daily. 90 tablet 3  . Cholecalciferol (VITAMIN D3) 125 MCG (5000 UT) TABS Take by mouth.    . Dulaglutide (TRULICITY) 1.5 FO/2.7XA SOPN INJECT 1.5 MG INTO THE SKIN ONCE A WEEK. 12 pen 1  . glucose blood (ONETOUCH VERIO) test strip 1 each by  Other route once a week. And lancets 1/day 30 each 1  . Lancets (ONETOUCH ULTRASOFT) lancets Used to check blood sugars daily. DX CODE E11.9. 100 each 12  . levothyroxine (SYNTHROID) 50 MCG tablet Take 1 tablet (50 mcg total) by mouth daily. 90 tablet 3  . lisinopril-hydrochlorothiazide (ZESTORETIC) 20-25 MG tablet Take 1 tablet by mouth daily. 90 tablet 1   No current facility-administered medications for this visit.    Family History  Problem Relation Age of Onset  . Heart disease Mother   . Hypertension Father   . Prostate cancer Father   . Hypertension Brother   . Hypertension Brother   . Thyroid disease Neg Hx   . Diabetes Neg Hx     Review of Systems  All other systems reviewed and are negative.   Exam:   BP 130/68   Pulse 82   Temp 97.7 F (36.5 C)   Ht 5\' 4"  (1.626 m)   Wt 160 lb (72.6 kg)   LMP 05/06/1999   SpO2 100%   BMI 27.46 kg/m   Weight change: @WEIGHTCHANGE @ Height:   Height: 5\' 4"  (162.6 cm)  Ht Readings from Last 3 Encounters:  10/13/19 5\' 4"  (1.626 m)  05/16/19 5\' 4"  (1.626 m)  10/25/18 5\' 4"  (1.626 m)    General appearance: alert, cooperative and appears stated age Head: Normocephalic, without obvious abnormality, atraumatic Neck: no adenopathy, supple, symmetrical, trachea midline and thyroid normal to inspection and palpation Lungs: clear to auscultation bilaterally Cardiovascular: regular rate and rhythm Breasts: normal appearance, no masses or tenderness Abdomen: soft, non-tender; non distended,  no masses,  no organomegaly Extremities: extremities normal, atraumatic, no cyanosis or edema Skin: Skin color, texture, turgor normal. No rashes or lesions Lymph nodes: Cervical, supraclavicular, and axillary nodes normal. No abnormal inguinal nodes palpated Neurologic: Grossly normal   Pelvic: External genitalia:  no lesions              Urethra:  normal appearing urethra with no masses, tenderness or lesions              Bartholins and  Skenes: normal                 Vagina: normal appearing vagina with normal color and discharge, no lesions              Cervix: absent               Bimanual Exam:  Uterus:  uterus absent              Adnexa: no mass, fullness, tenderness               Rectovaginal: Confirms               Anus:  normal sphincter tone, no lesions  Gae Dry chaperoned for the exam.  A:  Well Woman with normal exam    P:   No pap needed  Mammogram in the fall  Colonoscopy UTD  DEXA UTD  Discussed breast self exam  Discussed calcium and vit D intake

## 2019-10-13 ENCOUNTER — Encounter: Payer: Self-pay | Admitting: Obstetrics and Gynecology

## 2019-10-13 ENCOUNTER — Ambulatory Visit (INDEPENDENT_AMBULATORY_CARE_PROVIDER_SITE_OTHER): Payer: Medicare Other | Admitting: Obstetrics and Gynecology

## 2019-10-13 VITALS — BP 130/68 | HR 82 | Temp 97.7°F | Ht 64.0 in | Wt 160.0 lb

## 2019-10-13 DIAGNOSIS — Z01419 Encounter for gynecological examination (general) (routine) without abnormal findings: Secondary | ICD-10-CM

## 2019-10-13 DIAGNOSIS — Z124 Encounter for screening for malignant neoplasm of cervix: Secondary | ICD-10-CM

## 2019-10-13 NOTE — Patient Instructions (Signed)
EXERCISE AND DIET:  We recommended that you start or continue a regular exercise program for good health. Regular exercise means any activity that makes your heart beat faster and makes you sweat.  We recommend exercising at least 30 minutes per day at least 3 days a week, preferably 4 or 5.  We also recommend a diet low in fat and sugar.  Inactivity, poor dietary choices and obesity can cause diabetes, heart attack, stroke, and kidney damage, among others.    ALCOHOL AND SMOKING:  Women should limit their alcohol intake to no more than 7 drinks/beers/glasses of wine (combined, not each!) per week. Moderation of alcohol intake to this level decreases your risk of breast cancer and liver damage. And of course, no recreational drugs are part of a healthy lifestyle.  And absolutely no smoking or even second hand smoke. Most people know smoking can cause heart and lung diseases, but did you know it also contributes to weakening of your bones? Aging of your skin?  Yellowing of your teeth and nails?  CALCIUM AND VITAMIN D:  Adequate intake of calcium and Vitamin D are recommended.  The recommendations for exact amounts of these supplements seem to change often, but generally speaking 1,200 mg of calcium (between diet and supplement) and 800 units of Vitamin D per day seems prudent. Certain women may benefit from higher intake of Vitamin D.  If you are among these women, your doctor will have told you during your visit.    PAP SMEARS:  Pap smears, to check for cervical cancer or precancers,  have traditionally been done yearly, although recent scientific advances have shown that most women can have pap smears less often.  However, every woman still should have a physical exam from her gynecologist every year. It will include a breast check, inspection of the vulva and vagina to check for abnormal growths or skin changes, a visual exam of the cervix, and then an exam to evaluate the size and shape of the uterus and  ovaries.  And after 68 years of age, a rectal exam is indicated to check for rectal cancers. We will also provide age appropriate advice regarding health maintenance, like when you should have certain vaccines, screening for sexually transmitted diseases, bone density testing, colonoscopy, mammograms, etc.   MAMMOGRAMS:  All women over 40 years old should have a yearly mammogram. Many facilities now offer a "3D" mammogram, which may cost around $50 extra out of pocket. If possible,  we recommend you accept the option to have the 3D mammogram performed.  It both reduces the number of women who will be called back for extra views which then turn out to be normal, and it is better than the routine mammogram at detecting truly abnormal areas.    COLON CANCER SCREENING: Now recommend starting at age 45. At this time colonoscopy is not covered for routine screening until 50. There are take home tests that can be done between 45-49.   COLONOSCOPY:  Colonoscopy to screen for colon cancer is recommended for all women at age 50.  We know, you hate the idea of the prep.  We agree, BUT, having colon cancer and not knowing it is worse!!  Colon cancer so often starts as a polyp that can be seen and removed at colonscopy, which can quite literally save your life!  And if your first colonoscopy is normal and you have no family history of colon cancer, most women don't have to have it again for   10 years.  Once every ten years, you can do something that may end up saving your life, right?  We will be happy to help you get it scheduled when you are ready.  Be sure to check your insurance coverage so you understand how much it will cost.  It may be covered as a preventative service at no cost, but you should check your particular policy.      Breast Self-Awareness Breast self-awareness means being familiar with how your breasts look and feel. It involves checking your breasts regularly and reporting any changes to your  health care provider. Practicing breast self-awareness is important. A change in your breasts can be a sign of a serious medical problem. Being familiar with how your breasts look and feel allows you to find any problems early, when treatment is more likely to be successful. All women should practice breast self-awareness, including women who have had breast implants. How to do a breast self-exam One way to learn what is normal for your breasts and whether your breasts are changing is to do a breast self-exam. To do a breast self-exam: Look for Changes  1. Remove all the clothing above your waist. 2. Stand in front of a mirror in a room with good lighting. 3. Put your hands on your hips. 4. Push your hands firmly downward. 5. Compare your breasts in the mirror. Look for differences between them (asymmetry), such as: ? Differences in shape. ? Differences in size. ? Puckers, dips, and bumps in one breast and not the other. 6. Look at each breast for changes in your skin, such as: ? Redness. ? Scaly areas. 7. Look for changes in your nipples, such as: ? Discharge. ? Bleeding. ? Dimpling. ? Redness. ? A change in position. Feel for Changes Carefully feel your breasts for lumps and changes. It is best to do this while lying on your back on the floor and again while sitting or standing in the shower or tub with soapy water on your skin. Feel each breast in the following way:  Place the arm on the side of the breast you are examining above your head.  Feel your breast with the other hand.  Start in the nipple area and make  inch (2 cm) overlapping circles to feel your breast. Use the pads of your three middle fingers to do this. Apply light pressure, then medium pressure, then firm pressure. The light pressure will allow you to feel the tissue closest to the skin. The medium pressure will allow you to feel the tissue that is a little deeper. The firm pressure will allow you to feel the tissue  close to the ribs.  Continue the overlapping circles, moving downward over the breast until you feel your ribs below your breast.  Move one finger-width toward the center of the body. Continue to use the  inch (2 cm) overlapping circles to feel your breast as you move slowly up toward your collarbone.  Continue the up and down exam using all three pressures until you reach your armpit.  Write Down What You Find  Write down what is normal for each breast and any changes that you find. Keep a written record with breast changes or normal findings for each breast. By writing this information down, you do not need to depend only on memory for size, tenderness, or location. Write down where you are in your menstrual cycle, if you are still menstruating. If you are having trouble noticing differences   in your breasts, do not get discouraged. With time you will become more familiar with the variations in your breasts and more comfortable with the exam. How often should I examine my breasts? Examine your breasts every month. If you are breastfeeding, the best time to examine your breasts is after a feeding or after using a breast pump. If you menstruate, the best time to examine your breasts is 5-7 days after your period is over. During your period, your breasts are lumpier, and it may be more difficult to notice changes. When should I see my health care provider? See your health care provider if you notice:  A change in shape or size of your breasts or nipples.  A change in the skin of your breast or nipples, such as a reddened or scaly area.  Unusual discharge from your nipples.  A lump or thick area that was not there before.  Pain in your breasts.  Anything that concerns you.  

## 2019-10-24 ENCOUNTER — Other Ambulatory Visit: Payer: Self-pay | Admitting: Family Medicine

## 2019-10-26 ENCOUNTER — Other Ambulatory Visit: Payer: Self-pay

## 2019-10-26 ENCOUNTER — Ambulatory Visit (INDEPENDENT_AMBULATORY_CARE_PROVIDER_SITE_OTHER): Payer: Medicare Other | Admitting: Family Medicine

## 2019-10-26 ENCOUNTER — Encounter: Payer: 59 | Admitting: Family Medicine

## 2019-10-26 ENCOUNTER — Encounter: Payer: Self-pay | Admitting: Family Medicine

## 2019-10-26 VITALS — BP 140/70 | HR 91 | Temp 97.9°F | Ht 64.0 in | Wt 159.8 lb

## 2019-10-26 DIAGNOSIS — Z0000 Encounter for general adult medical examination without abnormal findings: Secondary | ICD-10-CM

## 2019-10-26 DIAGNOSIS — I10 Essential (primary) hypertension: Secondary | ICD-10-CM

## 2019-10-26 DIAGNOSIS — E1159 Type 2 diabetes mellitus with other circulatory complications: Secondary | ICD-10-CM

## 2019-10-26 DIAGNOSIS — E1169 Type 2 diabetes mellitus with other specified complication: Secondary | ICD-10-CM

## 2019-10-26 DIAGNOSIS — E119 Type 2 diabetes mellitus without complications: Secondary | ICD-10-CM

## 2019-10-26 DIAGNOSIS — E038 Other specified hypothyroidism: Secondary | ICD-10-CM

## 2019-10-26 DIAGNOSIS — E785 Hyperlipidemia, unspecified: Secondary | ICD-10-CM

## 2019-10-26 LAB — LIPID PANEL
Cholesterol: 174 mg/dL (ref 0–200)
HDL: 83.2 mg/dL (ref >39.00)
LDL Cholesterol: 73 mg/dL (ref 0–99)
NonHDL: 90.95
Total CHOL/HDL Ratio: 2
Triglycerides: 89 mg/dL (ref 0.0–149.0)
VLDL: 17.8 mg/dL (ref 0.0–40.0)

## 2019-10-26 LAB — CBC WITH DIFFERENTIAL/PLATELET
Basophils Absolute: 0.1 10*3/uL (ref 0.0–0.1)
Basophils Relative: 0.7 % (ref 0.0–3.0)
Eosinophils Absolute: 0.2 10*3/uL (ref 0.0–0.7)
Eosinophils Relative: 1.9 % (ref 0.0–5.0)
HCT: 40.7 % (ref 36.0–46.0)
Hemoglobin: 13.5 g/dL (ref 12.0–15.0)
Lymphocytes Relative: 32.9 % (ref 12.0–46.0)
Lymphs Abs: 3.1 10*3/uL (ref 0.7–4.0)
MCHC: 33.1 g/dL (ref 30.0–36.0)
MCV: 89.3 fl (ref 78.0–100.0)
Monocytes Absolute: 0.5 10*3/uL (ref 0.1–1.0)
Monocytes Relative: 5.2 % (ref 3.0–12.0)
Neutro Abs: 5.6 10*3/uL (ref 1.4–7.7)
Neutrophils Relative %: 59.3 % (ref 43.0–77.0)
Platelets: 167 10*3/uL (ref 150.0–400.0)
RBC: 4.56 Mil/uL (ref 3.87–5.11)
RDW: 13 % (ref 11.5–15.5)
WBC: 9.3 10*3/uL (ref 4.0–10.5)

## 2019-10-26 LAB — COMPREHENSIVE METABOLIC PANEL
ALT: 17 U/L (ref 0–35)
AST: 22 U/L (ref 0–37)
Albumin: 4.6 g/dL (ref 3.5–5.2)
Alkaline Phosphatase: 80 U/L (ref 39–117)
BUN: 17 mg/dL (ref 6–23)
CO2: 30 mEq/L (ref 19–32)
Calcium: 10.3 mg/dL (ref 8.4–10.5)
Chloride: 103 mEq/L (ref 96–112)
Creatinine, Ser: 0.81 mg/dL (ref 0.40–1.20)
GFR: 85.19 mL/min (ref >60.00)
Glucose, Bld: 83 mg/dL (ref 70–99)
Potassium: 4.2 mEq/L (ref 3.5–5.1)
Sodium: 140 mEq/L (ref 135–145)
Total Bilirubin: 0.8 mg/dL (ref 0.2–1.2)
Total Protein: 7.5 g/dL (ref 6.0–8.3)

## 2019-10-26 LAB — HEMOGLOBIN A1C: Hgb A1c MFr Bld: 5.3 % (ref 4.6–6.5)

## 2019-10-26 LAB — T4, FREE: Free T4: 1.06 ng/dL (ref 0.60–1.60)

## 2019-10-26 LAB — TSH: TSH: 1.15 u[IU]/mL (ref 0.35–4.50)

## 2019-10-26 NOTE — Progress Notes (Deleted)
Patient: Robin Rice MRN: [id number] DOB: 19-Jan-1951 PCP: Orma Flaming, MD     Subjective:  Chief Complaint  Patient presents with  . Annual Exam    HPI: The patient is a 69 y.o. female who presents today for Annual visit. She complains of constipation with dieting. She says that she takes metamucil but is not helping. She also attends [facility] and uses a cleansing tool once weekly. But she does not produce stools in between.   Review of Systems  Respiratory: Negative for cough, shortness of breath and wheezing.   Cardiovascular: Negative for chest pain, palpitations and leg swelling.  Gastrointestinal: Negative for abdominal pain, nausea and vomiting.  Genitourinary: Negative for frequency, pelvic pain and urgency.  Neurological: Negative for dizziness, light-headedness and headaches.    Allergies Patient is allergic to other.  Past Medical History Patient  has a past medical history of Blood transfusion without reported diagnosis (2000), Cataract (2012), DM (diabetes mellitus) ([place]), Fibroadenoma, Glaucoma, Left Eye, Goiter, Heart murmur, Hyperlipidemia, Hypertension, Hypothyroid, and Osteoarthritis, knee.  Surgical History Patient  has a past surgical history that includes Abdominal hysterectomy (2001); Abdominoplasty; Cataract extraction w/ intraocular lens  implant, bilateral; Eye surgery (2012); and Fracture surgery (2019).  Family History Pateint's family history includes Heart disease in her mother; Hypertension in her brother, brother, and father; Prostate cancer in her father.  Social History Patient  reports that she has never smoked. She has never used smokeless tobacco. She reports current alcohol use of about 1.0 standard drink of alcohol per week. She reports that she does not use drugs.    Objective: Vitals:   10/26/19 0947  Temp: 97.9 F (36.6 C)  TempSrc: Temporal  Weight: 159 lb 12.8 oz (72.5 kg)  Height: 5\' 4"  (1.626 m)    Body mass index  is 27.43 kg/m.  Physical Exam     Assessment/plan:      No follow-ups on file.     @AWME @ 10/26/2019

## 2019-10-26 NOTE — Patient Instructions (Addendum)
Health Maintenance  Topic Date Due   OPHTHALMOLOGY EXAM  10/27/2018   HEMOGLOBIN A1C  11/13/2019   INFLUENZA VACCINE  12/04/2019   MAMMOGRAM  01/12/2021   COLONOSCOPY  12/16/2022   TETANUS/TDAP  02/02/2028   DEXA SCAN  Completed   COVID-19 Vaccine  Completed   Hepatitis C Screening  Completed   PNA vac Low Risk Adult  Completed   FOOT EXAM  Discontinued   STOP the trulicity after you finish this pack and we will see how you are doing 3 months without medication.  See you back in 6 months and will check a1c/labs then.   Try citrucel for constipation and then if needed miralax. If want a pill form can try colace (stool softner) docusate sodium is generic.    Advanced directive information   Advance Directive  Advance directives are legal documents that let you make choices ahead of time about your health care and medical treatment in case you become unable to communicate for yourself. Advance directives are a way for you to make known your wishes to family, friends, and health care providers. This can let others know about your end-of-life care if you become unable to communicate. Discussing and writing advance directives should happen over time rather than all at once. Advance directives can be changed depending on your situation and what you want, even after you have signed the advance directives. There are different types of advance directives, such as:  Medical power of attorney.  Living will.  Do not resuscitate (DNR) or do not attempt resuscitation (DNAR) order. Health care proxy and medical power of attorney A health care proxy is also called a health care agent. This is a person who is appointed to make medical decisions for you in cases where you are unable to make the decisions yourself. Generally, people choose someone they know well and trust to represent their preferences. Make sure to ask this person for an agreement to act as your proxy. A proxy may have to  exercise judgment in the event of a medical decision for which your wishes are not known. A medical power of attorney is a legal document that names your health care proxy. Depending on the laws in your state, after the document is written, it may also need to be:  Signed.  Notarized.  Dated.  Copied.  Witnessed.  Incorporated into your medical record. You may also want to appoint someone to manage your money in a situation in which you are unable to do so. This is called a durable power of attorney for finances. It is a separate legal document from the durable power of attorney for health care. You may choose the same person or someone different from your health care proxy to act as your agent in money matters. If you do not appoint a proxy, or if there is a concern that the proxy is not acting in your best interests, a court may appoint a guardian to act on your behalf. Living will A living will is a set of instructions that state your wishes about medical care when you cannot express them yourself. Health care providers should keep a copy of your living will in your medical record. You may want to give a copy to family members or friends. To alert caregivers in case of an emergency, you can place a card in your wallet to let them know that you have a living will and where they can find it. A living will is  used if you become:  Terminally ill.  Disabled.  Unable to communicate or make decisions. Items to consider in your living will include:  To use or not to use life-support equipment, such as dialysis machines and breathing machines (ventilators).  A DNR or DNAR order. This tells health care providers not to use cardiopulmonary resuscitation (CPR) if breathing or heartbeat stops.  To use or not to use tube feeding.  To be given or not to be given food and fluids.  Comfort (palliative) care when the goal becomes comfort rather than a cure.  Donation of organs and tissues. A  living will does not give instructions for distributing your money and property if you should pass away. DNR or DNAR A DNR or DNAR order is a request not to have CPR in the event that your heart stops beating or you stop breathing. If a DNR or DNAR order has not been made and shared, a health care provider will try to help any patient whose heart has stopped or who has stopped breathing. If you plan to have surgery, talk with your health care provider about how your DNR or DNAR order will be followed if problems occur. What if I do not have an advance directive? If you do not have an advance directive, some states assign family decision makers to act on your behalf based on how closely you are related to them. Each state has its own laws about advance directives. You may want to check with your health care provider, attorney, or state representative about the laws in your state. Summary  Advance directives are the legal documents that allow you to make choices ahead of time about your health care and medical treatment in case you become unable to tell others about your care.  The process of discussing and writing advance directives should happen over time. You can change the advance directives, even after you have signed them.  Advance directives include DNR or DNAR orders, living wills, and designating an agent as your medical power of attorney. This information is not intended to replace advice given to you by your health care provider. Make sure you discuss any questions you have with your health care provider. Document Revised: 11/18/2018 Document Reviewed: 11/18/2018 Elsevier Patient Education  [place].

## 2019-10-26 NOTE — Progress Notes (Signed)
Phone: [phone]  Subjective:  Patient presents today for their  Medicare Exam    Preventive Screening-Counseling & Management  Hypertension Here for follow up of hypertension.  Currently on norvasc 10mg  and lisinopril-hctz 20-25mg  daily. Takes medication as prescribed and denies any side effects. Exercise includes walking. Weight has been stable. Denies any chest pain, headaches, shortness of breath, vision changes, swelling in lower extremities.   Hypothyroid Currently on synthroid 58mcg daily. Due for labs today.   Type 2 diabetes Patient is here for follow up of diabetes. First diagnosed 2017 with a1c of 6.5 and was always told she was borderline.  Currently on the following medications-trulicity 1.5mg /week . Takes medications as prescribed. Last A1C was 5.8. Currently exercising and following diabetic diet. Denies any hypoglycemic events. Denies any vision changes, nausea, vomiting, abdominal pain, ulcers/paraesthesia in feet, polyuria, polydipsia or polyphagia. Denies any chest pain, shortness of breath. . I wanted her to stop this as her a1c was 5.5, but she has continued to take this.     Advanced directives: no   Smoking Status: Never Smoker Second Hand Smoking status: No smokers in home  Risk Factors Regular exercise: yes, she walks Diet: good  Fall Risk: None  Fall Risk  10/26/2019 10/25/2018 05/04/2018 02/01/2018 09/15/2017  Falls in the past year? 0 0 0 No No  Number falls in past yr: - - 0 - -  Injury with Fall? - - 0 - -  Risk for fall due to : No Fall Risks - - - -   Opioid use history:  no long term opioids use  Cardiac risk factors:  advanced age (older than 69 for men, 18 for women) yes, age 5 years of age Hyperlipidemia yes No diabetes, but has prediabetes  Family History:    Depression Screen None. PHQ2 0  Depression screen [facility] 2/9 10/26/2019 05/16/2019 10/25/2018 02/01/2018 09/15/2017  Decreased Interest 0 0 0 0 0  Down, Depressed, Hopeless 0 0 0 0 0    PHQ - 2 Score 0 0 0 0 0  Altered sleeping - - - 0 -  Tired, decreased energy - - - 0 -  Change in appetite - - - 0 -  Feeling bad or failure about yourself  - - - 0 -  Trouble concentrating - - - 0 -  Moving slowly or fidgety/restless - - - 0 -  Suicidal thoughts - - - 0 -  PHQ-9 Score - - - 0 -  Difficult doing work/chores - - - Not difficult at all -    Activities of Daily Living Independent ADLs and IADLs   Hearing Difficulties: -patient declines  Cognitive Testing No reported trouble.    Abnormal 3 word recall Mini cog: 4/5   List the Names of Other Physician/Practitioners you currently use: -[facility].    Immunization History  Administered Date(s) Administered  . Fluad Quad(high Dose 65+) 01/06/2019  . Influenza, High Dose Seasonal PF 02/01/2018  . Influenza,inj,Quad PF,6+ Mos 01/06/2017  . Influenza-Unspecified 02/15/2015, 01/07/2016  . PFIZER SARS-COV-2 Vaccination 06/12/2019, 07/07/2019  . Pneumococcal Conjugate-13 02/15/2015  . Pneumococcal Polysaccharide-23 09/15/2017  . Tdap 05/18/2007, 02/01/2018  . Zoster 10/07/2012  . Zoster Recombinat (Shingrix) 10/31/2017, 02/08/2018   Required Immunizations needed today: none  Screening tests- up to date Health Maintenance Due  Topic Date Due  . OPHTHALMOLOGY EXAM  10/27/2018    Review of Systems  Constitutional: Negative for chills, fever and malaise/fatigue.  HENT: Negative for hearing loss and sore  throat.   Eyes: Negative for blurred vision and double vision.  Respiratory: Negative for cough, shortness of breath and wheezing.   Cardiovascular: Negative for chest pain, palpitations and leg swelling.  Gastrointestinal: Negative for abdominal pain, blood in stool, nausea and vomiting.  Genitourinary: Negative for dysuria and hematuria.  Musculoskeletal: Negative for falls.  Skin: Negative for rash.  Neurological: Negative for dizziness and weakness.  Psychiatric/Behavioral:  Negative for memory loss and suicidal ideas. The patient is not nervous/anxious and does not have insomnia.      The following were reviewed and entered/updated in epic: Past Medical History:  Diagnosis Date  . Blood transfusion without reported diagnosis 2000  . Cataract 2012  . DM (diabetes mellitus) ([place])   . Fibroadenoma   . Glaucoma, Left Eye   . Goiter   . Heart murmur   . Hyperlipidemia   . Hypertension   . Hypothyroid   . Osteoarthritis, knee    Patient Active Problem List   Diagnosis Date Noted  . Hypothyroid   . Simple goiter 09/20/2015  . Osteoarthritis, knee 09/20/2015  . Controlled type 2 diabetes mellitus without complication, without long-term current use of insulin ([place]) 09/20/2015  . Cardiac murmur 09/20/2015  . Benign essential HTN   . Hyperlipidemia, mixed   . Glaucoma, left eye    Past Surgical History:  Procedure Laterality Date  . ABDOMINAL HYSTERECTOMY  2001  . ABDOMINOPLASTY    . CATARACT EXTRACTION W/ INTRAOCULAR LENS  IMPLANT, BILATERAL    . EYE SURGERY  2012  . FRACTURE SURGERY  2019    Family History  Problem Relation Age of Onset  . Heart disease Mother   . Hypertension Father   . Prostate cancer Father   . Hypertension Brother   . Hypertension Brother   . Thyroid disease Neg Hx   . Diabetes Neg Hx     Medications- reviewed and updated Current Outpatient Medications  Medication Sig Dispense Refill  . amLODipine (NORVASC) 10 MG tablet Take 1 tablet (10 mg total) by mouth daily. 90 tablet 1  . aspirin 81 MG tablet Take 81 mg by mouth daily.    Marland Kitchen atorvastatin (LIPITOR) 20 MG tablet Take 1 tablet (20 mg total) by mouth daily. 90 tablet 3  . Cholecalciferol (VITAMIN D3) 125 MCG (5000 UT) TABS Take by mouth.    Marland Kitchen glucose blood (ONETOUCH VERIO) test strip 1 each by Other route once a week. And lancets 1/day 30 each 1  . Lancets (ONETOUCH ULTRASOFT) lancets Used to check blood sugars daily. DX CODE E11.9. 100 each 12  . levothyroxine  (SYNTHROID) 50 MCG tablet Take 1 tablet (50 mcg total) by mouth daily. 90 tablet 3  . lisinopril-hydrochlorothiazide (ZESTORETIC) 20-25 MG tablet Take 1 tablet by mouth daily. 90 tablet 1  . TRULICITY 1.5 ZO/1.0RU SOPN INJECT 1.5 MG INTO THE SKIN ONCE A WEEK. 12 pen 1   No current facility-administered medications for this visit.    Allergies-reviewed and updated Allergies  Allergen Reactions  . Other     Social History   Socioeconomic History  . Marital status: Married    Spouse name: Not on file  . Number of children: Not on file  . Years of education: Not on file  . Highest education level: Not on file  Occupational History  . 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  . Occupation: Retired    Fish farm manager: [employer]  Tobacco Use  . Smoking status: Never  Smoker  . Smokeless tobacco: Never Used  Vaping Use  . Vaping Use: Never used  Substance and Sexual Activity  . Alcohol use: Yes    Alcohol/week: 1.0 standard drink    Types: 1 Glasses of wine per week    Comment: Socially  . Drug use: No  . Sexual activity: Yes    Partners: Male    Birth control/protection: Surgical    Comment: TAH  Other Topics Concern  . Not on file  Social History Narrative  . Not on file   Social Determinants of Health   Financial Resource Strain:   . Difficulty of Paying Living Expenses:   Food Insecurity:   . Worried About Charity fundraiser in the Last Year:   . Arboriculturist in the Last Year:   Transportation Needs:   . Film/video editor (Medical):   Marland Kitchen Lack of Transportation (Non-Medical):   Physical Activity:   . Days of Exercise per Week:   . Minutes of Exercise per Session:   Stress:   . Feeling of Stress :   Social Connections:   . Frequency of Communication with Friends and Family:   . Frequency of Social Gatherings with Friends and Family:   . Attends Religious Services:   . Active Member of Clubs or Organizations:   . Attends Theatre manager Meetings:   Marland Kitchen Marital Status:     Objective: BP 140/70   Pulse 91   Temp 97.9 F (36.6 C) (Temporal)   Ht 5\' 4"  (1.626 m)   Wt 159 lb 12.8 oz (72.5 kg)   LMP 05/06/1999   SpO2 99%   BMI 27.43 kg/m  Gen: NAD, resting comfortably HEENT: Mucous membranes are moist. Oropharynx normal Neck: no thyromegaly. No carotid bruits.  CV: RRR no murmurs rubs or gallops Lungs: CTAB no crackles, wheeze, rhonchi Abdomen: soft/nontender/nondistended/normal bowel sounds. No rebound or guarding.  Ext: no edema Skin: warm, dry Neuro: grossly normal, moves all extremities, PERRLA  Assessment/Plan: Medicare exam completed- discussed recommended screenings anddocumented any personalized health advice and referrals for preventive counseling. See AVS as well which was given to patient.   Status of chronic or acute concerns  1. Medicare annual wellness visit, subsequent Advanced directive information given. HM UTD.   2. Other specified hypothyroidism Appears euthyroid. No refills needed. Labs today.  - TSH - T4, free  3. Hypertension  Blood pressure is to goal. Continue current anti-hypertensive medications per hpi. Refills not needed and routine lab work will be done today. Recommended routine exercise and healthy diet including DASH diet and mediterranean diet. Encouraged weight loss. F/u in 6 months.   - CBC with Differential/Platelet - Comprehensive metabolic panel  4. Hyperlipidemia  On statin, continue.  The 10-year ASCVD risk score Mikey Bussing DC Jr., et al., 2013) is: 23.3%  - Lipid panel  5. Diabetes discussed her last a1c was 5.5. and I really do not feel like she needs to be on trulicity. She is going to finish her box and then go off x 3 months. When I see her back we will check a1c and see how she is doing. She is worried about weight, but discussed again, I do not use this drug for weight loss. -on statin -on ace-I -utd on vaccinations   -fu on eye exam  - Hemoglobin  A1c     Future Appointments  Date Time Provider [place]  04/26/2020  8:00 AM Orma Flaming, MD [facility]  10/15/2020  1:00 PM Salvadore Dom, MD [place] None   Return in about 6 months (around 04/26/2020) for diabetes/htn .   Lab/Order associations: Other specified hypothyroidism - Plan: TSH, T4, free  Medicare annual wellness visit, subsequent  Hypertension associated with diabetes ([place]) - Plan: CBC with Differential/Platelet, Comprehensive metabolic panel  Hyperlipidemia associated with type 2 diabetes mellitus ([place]) - Plan: Lipid panel  Type 2 diabetes mellitus without complication, without long-term current use of insulin ([place]) - Plan: Hemoglobin A1c   Return precautions advised. Orma Flaming, MD

## 2019-11-14 ENCOUNTER — Ambulatory Visit: Payer: 59 | Admitting: Family Medicine

## 2019-11-30 ENCOUNTER — Other Ambulatory Visit: Payer: Self-pay | Admitting: Family Medicine

## 2019-11-30 DIAGNOSIS — E1159 Type 2 diabetes mellitus with other circulatory complications: Secondary | ICD-10-CM

## 2020-01-18 DIAGNOSIS — Z1231 Encounter for screening mammogram for malignant neoplasm of breast: Secondary | ICD-10-CM | POA: Diagnosis not present

## 2020-01-18 LAB — HM MAMMOGRAPHY

## 2020-01-20 ENCOUNTER — Encounter: Payer: Self-pay | Admitting: Family Medicine

## 2020-02-02 DIAGNOSIS — Z23 Encounter for immunization: Secondary | ICD-10-CM | POA: Diagnosis not present

## 2020-03-06 ENCOUNTER — Other Ambulatory Visit: Payer: Self-pay

## 2020-03-06 ENCOUNTER — Encounter: Payer: Self-pay | Admitting: Endocrinology

## 2020-03-06 ENCOUNTER — Ambulatory Visit (INDEPENDENT_AMBULATORY_CARE_PROVIDER_SITE_OTHER): Payer: Medicare Other | Admitting: Endocrinology

## 2020-03-06 VITALS — BP 158/68 | HR 76 | Ht 64.0 in | Wt 162.0 lb

## 2020-03-06 DIAGNOSIS — E042 Nontoxic multinodular goiter: Secondary | ICD-10-CM | POA: Insufficient documentation

## 2020-03-06 DIAGNOSIS — E119 Type 2 diabetes mellitus without complications: Secondary | ICD-10-CM | POA: Diagnosis not present

## 2020-03-06 LAB — POCT GLYCOSYLATED HEMOGLOBIN (HGB A1C): Hemoglobin A1C: 5.2 % (ref 4.0–5.6)

## 2020-03-06 MED ORDER — TRULICITY 0.75 MG/0.5ML ~~LOC~~ SOAJ
0.7500 mg | SUBCUTANEOUS | 3 refills | Status: DC
Start: 2020-03-06 — End: 2020-09-06

## 2020-03-06 NOTE — Patient Instructions (Addendum)
Let's recheck the ultrasound.  you will receive a phone call, about a day and time for an appointment.   Please continue the same levothyroxine.   I have sent a prescription to your pharmacy, to reduce the Trulicity.   Please come back for a follow-up appointment in 3 months.  Then, if the A1c is good, we can stop Trulicity or change to metformin

## 2020-03-06 NOTE — Progress Notes (Signed)
Subjective:    Patient ID: Robin Rice, female    DOB: November 11, 1950, 69 y.o.   MRN: [id number]  HPI Pt returns for f/u of diabetes mellitus:  DM type: 2 Dx'ed: 4098 Complications: none Therapy: Trulicity.   GDM: never DKA: never Severe hypoglycemia: never.   Pancreatitis: never Pancreatic imaging: never  Other: she has never been on insulin; edema limits rx options; PCP stopped metformin.  Interval history: pt states she feels well in general.  She says cbg's are well-controlled.  She would like to reduce rx if possible She also has multinodular goiter (dx'ed 2002; bx then showed FOLLICULAR EPITHELIUM IN A PREDOMINANTLY MICROFOLLICULAR PATTERN; [place] in 2018 showed Right inferior thyroid nodule has decreased in size from the prior; she is euthyroid on synthroid).   Past Medical History:  Diagnosis Date  . Blood transfusion without reported diagnosis 2000  . Cataract 2012  . DM (diabetes mellitus) ([place])   . Fibroadenoma   . Glaucoma, Left Eye   . Goiter   . Heart murmur   . Hyperlipidemia   . Hypertension   . Hypothyroid   . Osteoarthritis, knee     Past Surgical History:  Procedure Laterality Date  . ABDOMINAL HYSTERECTOMY  2001  . ABDOMINOPLASTY    . CATARACT EXTRACTION W/ INTRAOCULAR LENS  IMPLANT, BILATERAL    . EYE SURGERY  2012  . FRACTURE SURGERY  2019    Social History   Socioeconomic History  . Marital status: Married    Spouse name: Not on file  . Number of children: Not on file  . Years of education: Not on file  . Highest education level: Not on file  Occupational History  . 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  . Occupation: Retired    Fish farm manager: [employer]  Tobacco Use  . Smoking status: Never Smoker  . Smokeless tobacco: Never Used  Vaping Use  . Vaping Use: Never used  Substance and Sexual Activity  . Alcohol use: Yes    Alcohol/week: 1.0 standard drink    Types: 1 Glasses of wine per week    Comment:  Socially  . Drug use: No  . Sexual activity: Yes    Partners: Male    Birth control/protection: Surgical    Comment: TAH  Other Topics Concern  . Not on file  Social History Narrative  . Not on file   Social Determinants of Health   Financial Resource Strain:   . Difficulty of Paying Living Expenses: Not on file  Food Insecurity:   . Worried About Charity fundraiser in the Last Year: Not on file  . Ran Out of Food in the Last Year: Not on file  Transportation Needs:   . Lack of Transportation (Medical): Not on file  . Lack of Transportation (Non-Medical): Not on file  Physical Activity:   . Days of Exercise per Week: Not on file  . Minutes of Exercise per Session: Not on file  Stress:   . Feeling of Stress : Not on file  Social Connections:   . Frequency of Communication with Friends and Family: Not on file  . Frequency of Social Gatherings with Friends and Family: Not on file  . Attends Religious Services: Not on file  . Active Member of Clubs or Organizations: Not on file  . Attends Archivist Meetings: Not on file  . Marital Status: Not on file  Intimate Partner Violence:   . Fear  of Current or Ex-Partner: Not on file  . Emotionally Abused: Not on file  . Physically Abused: Not on file  . Sexually Abused: Not on file    Current Outpatient Medications on File Prior to Visit  Medication Sig Dispense Refill  . amLODipine (NORVASC) 10 MG tablet TAKE 1 TABLET BY MOUTH EVERY DAY 90 tablet 1  . aspirin 81 MG tablet Take 81 mg by mouth daily.    Marland Kitchen atorvastatin (LIPITOR) 20 MG tablet Take 1 tablet (20 mg total) by mouth daily. 90 tablet 3  . Cholecalciferol (VITAMIN D3) 125 MCG (5000 UT) TABS Take by mouth.    Marland Kitchen glucose blood (ONETOUCH VERIO) test strip 1 each by Other route once a week. And lancets 1/day 30 each 1  . Lancets (ONETOUCH ULTRASOFT) lancets Used to check blood sugars daily. DX CODE E11.9. 100 each 12  . levothyroxine (SYNTHROID) 50 MCG tablet Take 1  tablet (50 mcg total) by mouth daily. 90 tablet 3  . lisinopril-hydrochlorothiazide (ZESTORETIC) 20-25 MG tablet TAKE 1 TABLET BY MOUTH EVERY DAY 90 tablet 1   No current facility-administered medications on file prior to visit.    Allergies  Allergen Reactions  . Other     Family History  Problem Relation Age of Onset  . Heart disease Mother   . Hypertension Father   . Prostate cancer Father   . Hypertension Brother   . Hypertension Brother   . Thyroid disease Neg Hx   . Diabetes Neg Hx     BP (!) 158/68   Pulse 76   Ht 5\' 4"  (1.626 m)   Wt 162 lb (73.5 kg)   LMP 05/06/1999   SpO2 99%   BMI 27.81 kg/m    Review of Systems She denies hypoglycemia and nausea.      Objective:   Physical Exam VITAL SIGNS:  See vs page GENERAL: no distress NECK: There is no palpable thyroid enlargement.  No thyroid nodule is palpable.  No palpable lymphadenopathy at the anterior neck.   Pulses: dorsalis pedis intact bilat.   MSK: no deformity of the feet CV: no leg edema Skin:  no ulcer on the feet.  normal color and temp on the feet. Neuro: sensation is intact to touch on the feet.    Lab Results  Component Value Date   TSH 1.15 10/26/2019   Lab Results  Component Value Date   CREATININE 0.81 10/26/2019   BUN 17 10/26/2019   NA 140 10/26/2019   K 4.2 10/26/2019   CL 103 10/26/2019   CO2 30 10/26/2019   Lab Results  Component Value Date   HGBA1C 5.2 03/06/2020       Assessment & Plan:  Type 2 DM, well-controlled. Hypothyroidism: well-replaced.  Please continue the same synthroid MNG: due for recheck.  Patient Instructions  Let's recheck the ultrasound.  you will receive a phone call, about a day and time for an appointment.   Please continue the same levothyroxine.   I have sent a prescription to your pharmacy, to reduce the Trulicity.   Please come back for a follow-up appointment in 3 months.  Then, if the A1c is good, we can stop Trulicity or change to  metformin

## 2020-03-16 ENCOUNTER — Ambulatory Visit
Admission: RE | Admit: 2020-03-16 | Discharge: 2020-03-16 | Disposition: A | Discharge status: Home / Self Care | Payer: Medicare Other | Source: Ambulatory Visit | Attending: Endocrinology | Admitting: Endocrinology

## 2020-03-16 DIAGNOSIS — E042 Nontoxic multinodular goiter: Secondary | ICD-10-CM

## 2020-03-16 DIAGNOSIS — E041 Nontoxic single thyroid nodule: Secondary | ICD-10-CM | POA: Diagnosis not present

## 2020-04-02 ENCOUNTER — Other Ambulatory Visit: Payer: Self-pay | Admitting: Family Medicine

## 2020-04-26 ENCOUNTER — Ambulatory Visit: Payer: 59 | Admitting: Family Medicine

## 2020-06-04 ENCOUNTER — Other Ambulatory Visit: Payer: Self-pay | Admitting: Family Medicine

## 2020-06-04 DIAGNOSIS — I152 Hypertension secondary to endocrine disorders: Secondary | ICD-10-CM

## 2020-06-04 DIAGNOSIS — E1159 Type 2 diabetes mellitus with other circulatory complications: Secondary | ICD-10-CM

## 2020-06-05 ENCOUNTER — Other Ambulatory Visit: Payer: Self-pay

## 2020-06-07 ENCOUNTER — Ambulatory Visit (INDEPENDENT_AMBULATORY_CARE_PROVIDER_SITE_OTHER): Payer: Medicare Other | Admitting: Endocrinology

## 2020-06-07 ENCOUNTER — Other Ambulatory Visit: Payer: Self-pay

## 2020-06-07 VITALS — BP 160/70 | HR 95 | Ht 64.0 in | Wt 167.2 lb

## 2020-06-07 DIAGNOSIS — E119 Type 2 diabetes mellitus without complications: Secondary | ICD-10-CM

## 2020-06-07 LAB — POCT GLYCOSYLATED HEMOGLOBIN (HGB A1C): Hemoglobin A1C: 5.7 % — AB (ref 4.0–5.6)

## 2020-06-07 NOTE — Progress Notes (Signed)
Subjective:    Patient ID: Robin Rice, female    DOB: 1950-07-13, 70 y.o.   MRN: [id number]  HPI Pt returns for f/u of diabetes mellitus:  DM type: 2 Dx'ed: 6283 Complications: none Therapy: Trulicity.   GDM: never DKA: never Severe hypoglycemia: never.   Pancreatitis: never Pancreatic imaging: never  Other: she has never been on insulin; edema limits rx options; PCP stopped metformin.  Interval history: pt states she feels well in general.  She says cbg's are well-controlled.  She would like to reduce rx if possible She also has multinodular goiter (dx'ed 2002; bx then showed FOLLICULAR EPITHELIUM IN A PREDOMINANTLY MICROFOLLICULAR PATTERN; [place] in 2021 was unchanged since 2018; she is euthyroid on synthroid).   Past Medical History:  Diagnosis Date  . Blood transfusion without reported diagnosis 2000  . Cataract 2012  . DM (diabetes mellitus) ([place])   . Fibroadenoma   . Glaucoma, Left Eye   . Goiter   . Heart murmur   . Hyperlipidemia   . Hypertension   . Hypothyroid   . Osteoarthritis, knee     Past Surgical History:  Procedure Laterality Date  . ABDOMINAL HYSTERECTOMY  2001  . ABDOMINOPLASTY    . CATARACT EXTRACTION W/ INTRAOCULAR LENS  IMPLANT, BILATERAL    . EYE SURGERY  2012  . FRACTURE SURGERY  2019    Social History   Socioeconomic History  . Marital status: Married    Spouse name: Not on file  . Number of children: Not on file  . Years of education: Not on file  . Highest education level: Not on file  Occupational History  . 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  . Occupation: Retired    Fish farm manager: [employer]  Tobacco Use  . Smoking status: Never Smoker  . Smokeless tobacco: Never Used  Vaping Use  . Vaping Use: Never used  Substance and Sexual Activity  . Alcohol use: Yes    Alcohol/week: 1.0 standard drink    Types: 1 Glasses of wine per week    Comment: Socially  . Drug use: No  . Sexual activity: Yes     Partners: Male    Birth control/protection: Surgical    Comment: TAH  Other Topics Concern  . Not on file  Social History Narrative  . Not on file   Social Determinants of Health   Financial Resource Strain: Not on file  Food Insecurity: Not on file  Transportation Needs: Not on file  Physical Activity: Not on file  Stress: Not on file  Social Connections: Not on file  Intimate Partner Violence: Not on file    Current Outpatient Medications on File Prior to Visit  Medication Sig Dispense Refill  . amLODipine (NORVASC) 10 MG tablet TAKE 1 TABLET BY MOUTH EVERY DAY 90 tablet 1  . aspirin 81 MG tablet Take 81 mg by mouth daily.    Marland Kitchen atorvastatin (LIPITOR) 20 MG tablet Take 1 tablet (20 mg total) by mouth daily. 90 tablet 3  . Cholecalciferol (VITAMIN D3) 125 MCG (5000 UT) TABS Take by mouth.    . Dulaglutide (TRULICITY) 1.51 VO/1.6WV SOPN Inject 0.75 mg into the skin once a week. 6 mL 3  . glucose blood (ONETOUCH VERIO) test strip 1 each by Other route once a week. And lancets 1/day 30 each 1  . Lancets (ONETOUCH ULTRASOFT) lancets Used to check blood sugars daily. DX CODE E11.9. 100 each 12  . levothyroxine (  SYNTHROID) 50 MCG tablet Take 1 tablet (50 mcg total) by mouth daily. 90 tablet 3  . lisinopril-hydrochlorothiazide (ZESTORETIC) 20-25 MG tablet TAKE 1 TABLET BY MOUTH EVERY DAY 90 tablet 1   No current facility-administered medications on file prior to visit.    Allergies  Allergen Reactions  . Other     Family History  Problem Relation Age of Onset  . Heart disease Mother   . Hypertension Father   . Prostate cancer Father   . Hypertension Brother   . Hypertension Brother   . Thyroid disease Neg Hx   . Diabetes Neg Hx     BP (!) 160/70 (BP Location: Right Arm, Patient Position: Sitting, Cuff Size: Large)   Pulse 95   Ht 5\' 4"  (1.626 m)   Wt 167 lb 3.2 oz (75.8 kg)   LMP 05/06/1999   SpO2 99%   BMI 28.70 kg/m    Review of Systems     Objective:    Physical Exam VITAL SIGNS:  See vs page GENERAL: no distress    Lab Results  Component Value Date   HGBA1C 5.7 (A) 06/07/2020       Assessment & Plan:  Type 2 DM: well-controlled HTN: is noted today   Patient Instructions  Your blood pressure is high today.  Please see your primary care provider soon, to have it rechecked Please continue the same Trulicity.   Please come back for a follow-up appointment in 3 months.

## 2020-06-07 NOTE — Patient Instructions (Addendum)
Your blood pressure is high today.  Please see your primary care provider soon, to have it rechecked Please continue the same Trulicity.   Please come back for a follow-up appointment in 3 months.

## 2020-07-27 ENCOUNTER — Other Ambulatory Visit: Payer: Self-pay | Admitting: Family Medicine

## 2020-07-27 DIAGNOSIS — E782 Mixed hyperlipidemia: Secondary | ICD-10-CM

## 2020-07-27 NOTE — Telephone Encounter (Signed)
Please schedule an appointment with [facility] for further refills.

## 2020-08-25 ENCOUNTER — Other Ambulatory Visit: Payer: Self-pay | Admitting: Family Medicine

## 2020-08-25 DIAGNOSIS — E782 Mixed hyperlipidemia: Secondary | ICD-10-CM

## 2020-09-06 ENCOUNTER — Ambulatory Visit (INDEPENDENT_AMBULATORY_CARE_PROVIDER_SITE_OTHER): Payer: Medicare Other | Admitting: Endocrinology

## 2020-09-06 ENCOUNTER — Other Ambulatory Visit: Payer: Self-pay

## 2020-09-06 VITALS — BP 180/86 | HR 91 | Ht 64.0 in | Wt 168.2 lb

## 2020-09-06 DIAGNOSIS — E119 Type 2 diabetes mellitus without complications: Secondary | ICD-10-CM | POA: Diagnosis not present

## 2020-09-06 LAB — POCT GLYCOSYLATED HEMOGLOBIN (HGB A1C): Hemoglobin A1C: 5.5 % (ref 4.0–5.6)

## 2020-09-06 MED ORDER — TRULICITY 1.5 MG/0.5ML ~~LOC~~ SOAJ: 1.5000 mg | SUBCUTANEOUS | 3 refills | Status: DC

## 2020-09-06 NOTE — Progress Notes (Signed)
Subjective:    Patient ID: Robin Rice, female    DOB: [birth year], 70 y.o.   MRN: [id number]  HPI Pt returns for f/u of diabetes mellitus:  DM type: 2 Dx'ed: 6295 Complications: none Therapy: Trulicity.   GDM: never DKA: never Severe hypoglycemia: never.   Pancreatitis: never Pancreatic imaging: never  Other: she has never been on insulin; edema limits rx options; PCP stopped metformin.  Interval history: pt states she feels well in general.  She says cbg's are well-controlled.   She also has multinodular goiter (dx'ed 2002; bx then showed FOLLICULAR EPITHELIUM IN A PREDOMINANTLY MICROFOLLICULAR PATTERN; [place] in 2021 was unchanged since 2018; she is euthyroid on synthroid).   Past Medical History:  Diagnosis Date  . Blood transfusion without reported diagnosis 2000  . Cataract 2012  . DM (diabetes mellitus) ([place])   . Fibroadenoma   . Glaucoma, Left Eye   . Goiter   . Heart murmur   . Hyperlipidemia   . Hypertension   . Hypothyroid   . Osteoarthritis, knee     Past Surgical History:  Procedure Laterality Date  . ABDOMINAL HYSTERECTOMY  2001  . ABDOMINOPLASTY    . CATARACT EXTRACTION W/ INTRAOCULAR LENS  IMPLANT, BILATERAL    . EYE SURGERY  2012  . FRACTURE SURGERY  2019    Social History   Socioeconomic History  . Marital status: Married    Spouse name: Not on file  . Number of children: Not on file  . Years of education: Not on file  . Highest education level: Not on file  Occupational History  . 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  . Occupation: Retired    Fish farm manager: [employer]  Tobacco Use  . Smoking status: Never Smoker  . Smokeless tobacco: Never Used  Vaping Use  . Vaping Use: Never used  Substance and Sexual Activity  . Alcohol use: Yes    Alcohol/week: 1.0 standard drink    Types: 1 Glasses of wine per week    Comment: Socially  . Drug use: No  . Sexual activity: Yes    Partners: Male    Birth  control/protection: Surgical    Comment: TAH  Other Topics Concern  . Not on file  Social History Narrative  . Not on file   Social Determinants of Health   Financial Resource Strain: Not on file  Food Insecurity: Not on file  Transportation Needs: Not on file  Physical Activity: Not on file  Stress: Not on file  Social Connections: Not on file  Intimate Partner Violence: Not on file    Current Outpatient Medications on File Prior to Visit  Medication Sig Dispense Refill  . amLODipine (NORVASC) 10 MG tablet TAKE 1 TABLET BY MOUTH EVERY DAY 90 tablet 1  . aspirin 81 MG tablet Take 81 mg by mouth daily.    Marland Kitchen atorvastatin (LIPITOR) 20 MG tablet TAKE 1 TABLET BY MOUTH EVERY DAY 90 tablet 1  . Cholecalciferol (VITAMIN D3) 125 MCG (5000 UT) TABS Take by mouth.    Marland Kitchen glucose blood (ONETOUCH VERIO) test strip 1 each by Other route once a week. And lancets 1/day 30 each 1  . Lancets (ONETOUCH ULTRASOFT) lancets Used to check blood sugars daily. DX CODE E11.9. 100 each 12  . levothyroxine (SYNTHROID) 50 MCG tablet TAKE 1 TABLET BY MOUTH EVERY DAY 90 tablet 1  . lisinopril-hydrochlorothiazide (ZESTORETIC) 20-25 MG tablet TAKE 1 TABLET BY MOUTH EVERY DAY  90 tablet 1   No current facility-administered medications on file prior to visit.    Allergies  Allergen Reactions  . Other     Family History  Problem Relation Age of Onset  . Heart disease Mother   . Hypertension Father   . Prostate cancer Father   . Hypertension Brother   . Hypertension Brother   . Thyroid disease Neg Hx   . Diabetes Neg Hx     BP (!) 180/86 (BP Location: Right Arm, Patient Position: Sitting, Cuff Size: Normal)   Pulse 91   Ht 5\' 4"  (1.626 m)   Wt 168 lb 3.2 oz (76.3 kg)   LMP 05/06/1999   SpO2 97%   BMI 28.87 kg/m    Review of Systems She has gained a few lbs.      Objective:   Physical Exam VITAL SIGNS:  See vs page GENERAL: no distress Pulses: dorsalis pedis intact bilat.   MSK: no  deformity of the feet CV: no leg edema Skin:  no ulcer on the feet.  normal color and temp on the feet. Neuro: sensation is intact to touch on the feet  Lab Results  Component Value Date   TSH 1.15 10/26/2019   Lab Results  Component Value Date   CREATININE 0.81 10/26/2019   BUN 17 10/26/2019   NA 140 10/26/2019   K 4.2 10/26/2019   CL 103 10/26/2019   CO2 30 10/26/2019       Assessment & Plan:  Type 2 DM: uncontrolled.   Patient Instructions  Your blood pressure is high today.  Please see your primary care provider soon, to have it rechecked I have sent a prescription to your pharmacy, to increase the Trulicity.   Please come back for a follow-up appointment in 4 months.

## 2020-09-06 NOTE — Patient Instructions (Addendum)
Your blood pressure is high today.  Please see your primary care provider soon, to have it rechecked I have sent a prescription to your pharmacy, to increase the Trulicity.   Please come back for a follow-up appointment in 4 months.

## 2020-10-15 ENCOUNTER — Ambulatory Visit: Payer: Medicare Other | Admitting: Obstetrics and Gynecology

## 2020-10-26 ENCOUNTER — Ambulatory Visit (INDEPENDENT_AMBULATORY_CARE_PROVIDER_SITE_OTHER): Payer: Medicare Other

## 2020-10-26 ENCOUNTER — Other Ambulatory Visit: Payer: Self-pay

## 2020-10-26 VITALS — BP 128/64 | HR 103 | Temp 97.9°F | Wt 167.6 lb

## 2020-10-26 DIAGNOSIS — Z0000 Encounter for general adult medical examination without abnormal findings: Secondary | ICD-10-CM | POA: Diagnosis not present

## 2020-10-26 NOTE — Patient Instructions (Signed)
Ms. Plass , Thank you for taking time to come for your Medicare Wellness Visit. I appreciate your ongoing commitment to your health goals. Please review the following plan we discussed and let me know if I can assist you in the future.   Screening recommendations/referrals: Colonoscopy: Done 12/15/12 repeat 12/17/22 Mammogram: Done 01/13/19 repeat every year Bone Density: Done 12/30/17 repeat in 5 years Recommended yearly ophthalmology/optometry visit for glaucoma screening and checkup Recommended yearly dental visit for hygiene and checkup  Vaccinations: Influenza vaccine: Due 12/03/20 Pneumococcal vaccine: Completed  Tdap vaccine: Done 02/01/18 repeat 02/02/28 Shingles vaccine: Completed 6/29 & 02/08/21   Covid-19:Completed 2/7, 3/4, & 08/27/20  Advanced directives: Please bring a copy of your health care power of attorney and living will to the office at your convenience.  Conditions/risks identified: Lose weight   Next appointment: Follow up in one year for your annual wellness visit    Preventive Care 65 Years and Older, Female Preventive care refers to lifestyle choices and visits with your health care provider that can promote health and wellness. What does preventive care include? A yearly physical exam. This is also called an annual well check. Dental exams once or twice a year. Routine eye exams. Ask your health care provider how often you should have your eyes checked. Personal lifestyle choices, including: Daily care of your teeth and gums. Regular physical activity. Eating a healthy diet. Avoiding tobacco and drug use. Limiting alcohol use. Practicing safe sex. Taking low-dose aspirin every day. Taking vitamin and mineral supplements as recommended by your health care provider. What happens during an annual well check? The services and screenings done by your health care provider during your annual well check will depend on your age, overall health, lifestyle risk  factors, and family history of disease. Counseling  Your health care provider may ask you questions about your: Alcohol use. Tobacco use. Drug use. Emotional well-being. Home and relationship well-being. Sexual activity. Eating habits. History of falls. Memory and ability to understand (cognition). Work and work Statistician. Reproductive health. Screening  You may have the following tests or measurements: Height, weight, and BMI. Blood pressure. Lipid and cholesterol levels. These may be checked every 5 years, or more frequently if you are over 81 years old. Skin check. Lung cancer screening. You may have this screening every year starting at age 84 if you have a 30-pack-year history of smoking and currently smoke or have quit within the past 15 years. Fecal occult blood test (FOBT) of the stool. You may have this test every year starting at age 44. Flexible sigmoidoscopy or colonoscopy. You may have a sigmoidoscopy every 5 years or a colonoscopy every 10 years starting at age 51. Hepatitis C blood test. Hepatitis B blood test. Sexually transmitted disease (STD) testing. Diabetes screening. This is done by checking your blood sugar (glucose) after you have not eaten for a while (fasting). You may have this done every 1-3 years. Bone density scan. This is done to screen for osteoporosis. You may have this done starting at age 48. Mammogram. This may be done every 1-2 years. Talk to your health care provider about how often you should have regular mammograms. Talk with your health care provider about your test results, treatment options, and if necessary, the need for more tests. Vaccines  Your health care provider may recommend certain vaccines, such as: Influenza vaccine. This is recommended every year. Tetanus, diphtheria, and acellular pertussis (Tdap, Td) vaccine. You may need a Td booster every 10  years. Zoster vaccine. You may need this after age 4. Pneumococcal 13-valent  conjugate (PCV13) vaccine. One dose is recommended after age 64. Pneumococcal polysaccharide (PPSV23) vaccine. One dose is recommended after age 80. Talk to your health care provider about which screenings and vaccines you need and how often you need them. This information is not intended to replace advice given to you by your health care provider. Make sure you discuss any questions you have with your health care provider. Document Released: 05/18/2015 Document Revised: 01/09/2016 Document Reviewed: 02/20/2015 Elsevier Interactive Patient Education  2017 [place] Prevention in the Home Falls can cause injuries. They can happen to people of all ages. There are many things you can do to make your home safe and to help prevent falls. What can I do on the outside of my home? Regularly fix the edges of walkways and driveways and fix any cracks. Remove anything that might make you trip as you walk through a door, such as a raised step or threshold. Trim any bushes or trees on the path to your home. Use bright outdoor lighting. Clear any walking paths of anything that might make someone trip, such as rocks or tools. Regularly check to see if handrails are loose or broken. Make sure that both sides of any steps have handrails. Any raised decks and porches should have guardrails on the edges. Have any leaves, snow, or ice cleared regularly. Use sand or salt on walking paths during winter. Clean up any spills in your garage right away. This includes oil or grease spills. What can I do in the bathroom? Use night lights. Install grab bars by the toilet and in the tub and shower. Do not use towel bars as grab bars. Use non-skid mats or decals in the tub or shower. If you need to sit down in the shower, use a plastic, non-slip stool. Keep the floor dry. Clean up any water that spills on the floor as soon as it happens. Remove soap buildup in the tub or shower regularly. Attach bath mats  securely with double-sided non-slip rug tape. Do not have throw rugs and other things on the floor that can make you trip. What can I do in the bedroom? Use night lights. Make sure that you have a light by your bed that is easy to reach. Do not use any sheets or blankets that are too big for your bed. They should not hang down onto the floor. Have a firm chair that has side arms. You can use this for support while you get dressed. Do not have throw rugs and other things on the floor that can make you trip. What can I do in the kitchen? Clean up any spills right away. Avoid walking on wet floors. Keep items that you use a lot in easy-to-reach places. If you need to reach something above you, use a strong step stool that has a grab bar. Keep electrical cords out of the way. Do not use floor polish or wax that makes floors slippery. If you must use wax, use non-skid floor wax. Do not have throw rugs and other things on the floor that can make you trip. What can I do with my stairs? Do not leave any items on the stairs. Make sure that there are handrails on both sides of the stairs and use them. Fix handrails that are broken or loose. Make sure that handrails are as long as the stairways. Check any carpeting to make sure  that it is firmly attached to the stairs. Fix any carpet that is loose or worn. Avoid having throw rugs at the top or bottom of the stairs. If you do have throw rugs, attach them to the floor with carpet tape. Make sure that you have a light switch at the top of the stairs and the bottom of the stairs. If you do not have them, ask someone to add them for you. What else can I do to help prevent falls? Wear shoes that: Do not have high heels. Have rubber bottoms. Are comfortable and fit you well. Are closed at the toe. Do not wear sandals. If you use a stepladder: Make sure that it is fully opened. Do not climb a closed stepladder. Make sure that both sides of the stepladder  are locked into place. Ask someone to hold it for you, if possible. Clearly mark and make sure that you can see: Any grab bars or handrails. First and last steps. Where the edge of each step is. Use tools that help you move around (mobility aids) if they are needed. These include: Canes. Walkers. Scooters. Crutches. Turn on the lights when you go into a dark area. Replace any light bulbs as soon as they burn out. Set up your furniture so you have a clear path. Avoid moving your furniture around. If any of your floors are uneven, fix them. If there are any pets around you, be aware of where they are. Review your medicines with your doctor. Some medicines can make you feel dizzy. This can increase your chance of falling. Ask your doctor what other things that you can do to help prevent falls. This information is not intended to replace advice given to you by your health care provider. Make sure you discuss any questions you have with your health care provider. Document Released: 02/15/2009 Document Revised: 09/27/2015 Document Reviewed: 05/26/2014 Elsevier Interactive Patient Education  2017 [employer].

## 2020-10-26 NOTE — Progress Notes (Addendum)
Subjective:   Robin Rice is a 70 y.o. female who presents for Medicare Annual (Subsequent) preventive examination.  Review of Systems     Cardiac Risk Factors include: advanced age (>80men, >74 women);diabetes mellitus;hypertension;dyslipidemia     Objective:    Today's Vitals   10/26/20 0848  BP: 128/64  Pulse: (!) 103  Temp: 97.9 F (36.6 C)  SpO2: 98%  Weight: 167 lb 9.6 oz (76 kg)   Body mass index is 28.77 kg/m.  Advanced Directives 10/26/2020 10/25/2018  Does Patient Have a Medical Advance Directive? Yes Yes  Type of Advance Directive [place] -  Does patient want to make changes to medical advance directive? - Yes (MAU/Ambulatory/Procedural Areas - Information given)  Copy of [place] in Chart? No - copy requested -    Current Medications (verified) Outpatient Encounter Medications as of 10/26/2020  Medication Sig   amLODipine (NORVASC) 10 MG tablet TAKE 1 TABLET BY MOUTH EVERY DAY   aspirin 81 MG tablet Take 81 mg by mouth daily.   atorvastatin (LIPITOR) 20 MG tablet TAKE 1 TABLET BY MOUTH EVERY DAY   Cholecalciferol (VITAMIN D3) 125 MCG (5000 UT) TABS Take by mouth.   Dulaglutide (TRULICITY) 1.5 MG/8.6PY SOPN Inject 1.5 mg into the skin once a week.   glucose blood (ONETOUCH VERIO) test strip 1 each by Other route once a week. And lancets 1/day   Lancets (ONETOUCH ULTRASOFT) lancets Used to check blood sugars daily. DX CODE E11.9.   levothyroxine (SYNTHROID) 50 MCG tablet TAKE 1 TABLET BY MOUTH EVERY DAY   lisinopril-hydrochlorothiazide (ZESTORETIC) 20-25 MG tablet TAKE 1 TABLET BY MOUTH EVERY DAY   No facility-administered encounter medications on file as of 10/26/2020.    Allergies (verified) Other   History: Past Medical History:  Diagnosis Date   Blood transfusion without reported diagnosis 2000   Cataract 2012   DM (diabetes mellitus) ([place])    Fibroadenoma    Glaucoma, Left Eye    Goiter    Heart  murmur    Hyperlipidemia    Hypertension    Hypothyroid    Osteoarthritis, knee    Past Surgical History:  Procedure Laterality Date   ABDOMINAL HYSTERECTOMY  2001   ABDOMINOPLASTY     CATARACT EXTRACTION W/ INTRAOCULAR LENS  IMPLANT, BILATERAL     EYE SURGERY  2012   FRACTURE SURGERY  2019   Family History  Problem Relation Age of Onset   Heart disease Mother    Hypertension Father    Prostate cancer Father    Hypertension Brother    Hypertension Brother    Thyroid disease Neg Hx    Diabetes Neg Hx    Social History   Socioeconomic History   Marital status: Married    Spouse name: Not on file   Number of children: Not on file   Years of education: Not on file   Highest education level: Not on file  Occupational History   Occupation: Programmer, systems: [employer]   Occupation: Retired    Fish farm manager: [place] [employer]  Tobacco Use   Smoking status: Never   Smokeless tobacco: Never  Vaping Use   Vaping Use: Never used  Substance and Sexual Activity   Alcohol use: Yes    Alcohol/week: 1.0 standard drink    Types: 1 Glasses of wine per week    Comment: Socially   Drug use: No   Sexual activity: Yes  Partners: Male    Birth control/protection: Surgical    Comment: TAH  Other Topics Concern   Not on file  Social History Narrative   Not on file   Social Determinants of Health   Financial Resource Strain: Low Risk    Difficulty of Paying Living Expenses: Not hard at all  Food Insecurity: No Food Insecurity   Worried About Charity fundraiser in the Last Year: Never true   Arboriculturist in the Last Year: Never true  Transportation Needs: No Transportation Needs   Lack of Transportation (Medical): No   Lack of Transportation (Non-Medical): No  Physical Activity: Sufficiently Active   Days of Exercise per Week: 4 days   Minutes of Exercise per Session: 90 min  Stress: No Stress Concern Present   Feeling of Stress : Not at all  Social  Connections: Moderately Integrated   Frequency of Communication with Friends and Family: More than three times a week   Frequency of Social Gatherings with Friends and Family: Three times a week   Attends Religious Services: More than 4 times per year   Active Member of Clubs or Organizations: No   Attends Archivist Meetings: Never   Marital Status: Married    Tobacco Counseling Counseling given: Not Answered   Clinical Intake:  Pre-visit preparation completed: Yes  Pain : No/denies pain     BMI - recorded: 28.77 Nutritional Status: BMI 25 -29 Overweight Nutritional Risks: None Diabetes: Yes CBG done?: No Did pt. bring in CBG monitor from home?: No  How often do you need to have someone help you when you read instructions, pamphlets, or other written materials from your doctor or pharmacy?: 1 - Never  Diabetic?Nutrition Risk Assessment:  Has the patient had any N/V/D within the last 2 months?  No  Does the patient have any non-healing wounds?  No  Has the patient had any unintentional weight loss or weight gain?  No   Diabetes:  Is the patient diabetic?  Yes  If diabetic, was a CBG obtained today?  No  Did the patient bring in their glucometer from home?  No  How often do you monitor your CBG's? Monthly.   Financial Strains and Diabetes Management:  Are you having any financial strains with the device, your supplies or your medication? No .  Does the patient want to be seen by Chronic Care Management for management of their diabetes?  No  Would the patient like to be referred to a Nutritionist or for Diabetic Management?  No   Diabetic Exams:  Diabetic Eye Exam: Overdue for diabetic eye exam. Pt has been advised about the importance in completing this exam. Patient advised to call and schedule an eye exam. Diabetic Foot Exam: Completed 09/06/20   Interpreter Needed?: No  Information entered by :: Charlott Rakes, LPN   Activities of Daily Living In  your present state of health, do you have any difficulty performing the following activities: 10/26/2020  Hearing? N  Vision? N  Difficulty concentrating or making decisions? Y  Comment at times  Walking or climbing stairs? N  Dressing or bathing? N  Doing errands, shopping? N  Preparing Food and eating ? N  Using the Toilet? N  In the past six months, have you accidently leaked urine? N  Do you have problems with loss of bowel control? N  Managing your Medications? N  Managing your Finances? N  Housekeeping or managing your Housekeeping? N  Some  recent data might be hidden    Patient Care Team: Vivi Barrack, MD as PCP - General (Family Medicine) Salvadore Dom, MD as Consulting Physician (Obstetrics and Gynecology) Renato Shin, MD as Consulting Physician (Endocrinology)  Indicate any recent Medical Services you may have received from other than [facility] providers in the past year (date may be approximate).     Assessment:   This is a routine wellness examination for Robin Rice.  Hearing/Vision screen Hearing Screening - Comments:: Pt denies any hearing issues  Vision Screening - Comments:: Pt follows up with [facility] for annual exams   Dietary issues and exercise activities discussed: Current Exercise Habits: Home exercise routine, Type of exercise: walking, Time (Minutes): > 60, Frequency (Times/Week): 4, Weekly Exercise (Minutes/Week): 0   Goals Addressed             This Visit's Progress    Patient Stated       Lose weight         Depression Screen PHQ 2/9 Scores 10/26/2020 10/26/2019 05/16/2019 10/25/2018 02/01/2018 09/15/2017 08/04/2016  PHQ - 2 Score 0 0 0 0 0 0 0  PHQ- 9 Score - - - - 0 - -    Fall Risk Fall Risk  10/26/2020 10/26/2019 10/25/2018 05/04/2018 02/01/2018  Falls in the past year? 0 0 0 0 No  Number falls in past yr: 0 - - 0 -  Injury with Fall? 0 - - 0 -  Risk for fall due to : Impaired vision No Fall Risks - - -  Follow up Falls  prevention discussed - - - -    FALL RISK PREVENTION PERTAINING TO THE HOME:  Any stairs in or around the home? No  If so, are there any without handrails? No  Home free of loose throw rugs in walkways, pet beds, electrical cords, etc? Yes  Adequate lighting in your home to reduce risk of falls? Yes   ASSISTIVE DEVICES UTILIZED TO PREVENT FALLS:  Life alert? No  Use of a cane, walker or w/c? No  Grab bars in the bathroom? Yes  Shower chair or bench in shower? Yes  Elevated toilet seat or a handicapped toilet? No   TIMED UP AND GO:  Was the test performed? Yes .  Length of time to ambulate 10 feet: 10 sec.   Gait steady and fast without use of assistive device  Cognitive Function:     6CIT Screen 10/26/2020  What Year? 0 points  What month? 0 points  What time? 0 points  Count back from 20 0 points  Months in reverse 4 points  Repeat phrase 2 points  Total Score 6    Immunizations Immunization History  Administered Date(s) Administered   Fluad Quad(high Dose 65+) 01/06/2019   Influenza, High Dose Seasonal PF 02/01/2018   Influenza,inj,Quad PF,6+ Mos 01/06/2017   Influenza-Unspecified 02/15/2015, 01/07/2016, 01/16/2020   PFIZER(Purple Top)SARS-COV-2 Vaccination 06/12/2019, 07/07/2019, 08/27/2020   Pneumococcal Conjugate-13 02/15/2015   Pneumococcal Polysaccharide-23 09/15/2017   Tdap 05/18/2007, 02/01/2018   Zoster Recombinat (Shingrix) 10/31/2017, 02/08/2018   Zoster, Live 10/07/2012    TDAP status: Up to date  Flu Vaccine status: Up to date  Pneumococcal vaccine status: Up to date  Covid-19 vaccine status: Completed vaccines  Qualifies for Shingles Vaccine? Yes   Zostavax completed Yes   Shingrix Completed?: Yes  Screening Tests Health Maintenance  Topic Date Due   OPHTHALMOLOGY EXAM  10/27/2018   COVID-19 Vaccine (4 - Booster for [place] series) 11/26/2020  INFLUENZA VACCINE  12/03/2020   MAMMOGRAM  01/12/2021   HEMOGLOBIN A1C  03/09/2021    COLONOSCOPY (Pts 45-88yrs Insurance coverage will need to be confirmed)  12/16/2022   DEXA SCAN  12/31/2022   TETANUS/TDAP  02/02/2028   Hepatitis C Screening  Completed   PNA vac Low Risk Adult  Completed   Zoster Vaccines- Shingrix  Completed   HPV VACCINES  Aged Out   FOOT EXAM  Discontinued    Health Maintenance  Health Maintenance Due  Topic Date Due   OPHTHALMOLOGY EXAM  10/27/2018    Colorectal cancer screening: Type of screening: Colonoscopy. Completed 12/15/12. Repeat every 10 years  Mammogram status: Completed 01/13/19. Repeat every year  Bone Density status: Completed 12/30/17. Results reflect: Bone density results: NORMAL. Repeat every 5 years.  Additional Screening:  Hepatitis C Screening:  Completed 10/20/16  Vision Screening: Recommended annual ophthalmology exams for early detection of glaucoma and other disorders of the eye. Is the patient up to date with their annual eye exam?  Yes  Who is the provider or what is the name of the office in which the patient attends annual eye exams? [facility] If pt is not established with a provider, would they like to be referred to a provider to establish care? No .   Dental Screening: Recommended annual dental exams for proper oral hygiene  Community Resource Referral / Chronic Care Management: CRR required this visit?  No   CCM required this visit?  No      Plan:     I have personally reviewed and noted the following in the patient's chart:   Medical and social history Use of alcohol, tobacco or illicit drugs  Current medications and supplements including opioid prescriptions.  Functional ability and status Nutritional status Physical activity Advanced directives List of other physicians Hospitalizations, surgeries, and ER visits in previous 12 months Vitals Screenings to include cognitive, depression, and falls Referrals and appointments  In addition, I have reviewed and discussed with patient  certain preventive protocols, quality metrics, and best practice recommendations. A written personalized care plan for preventive services as well as general preventive health recommendations were provided to patient.     Willette Brace, LPN   [id number]   Nurse Notes: None

## 2020-10-30 ENCOUNTER — Telehealth: Payer: Self-pay

## 2020-10-30 ENCOUNTER — Other Ambulatory Visit: Payer: Self-pay

## 2020-10-30 ENCOUNTER — Ambulatory Visit (INDEPENDENT_AMBULATORY_CARE_PROVIDER_SITE_OTHER): Payer: Medicare Other | Admitting: Family Medicine

## 2020-10-30 ENCOUNTER — Encounter: Payer: Self-pay | Admitting: Family Medicine

## 2020-10-30 VITALS — BP 134/71 | HR 86 | Temp 98.2°F | Ht 64.0 in | Wt 168.0 lb

## 2020-10-30 DIAGNOSIS — M8580 Other specified disorders of bone density and structure, unspecified site: Secondary | ICD-10-CM

## 2020-10-30 DIAGNOSIS — E1169 Type 2 diabetes mellitus with other specified complication: Secondary | ICD-10-CM

## 2020-10-30 DIAGNOSIS — E038 Other specified hypothyroidism: Secondary | ICD-10-CM

## 2020-10-30 DIAGNOSIS — E785 Hyperlipidemia, unspecified: Secondary | ICD-10-CM

## 2020-10-30 DIAGNOSIS — E119 Type 2 diabetes mellitus without complications: Secondary | ICD-10-CM | POA: Diagnosis not present

## 2020-10-30 DIAGNOSIS — H409 Unspecified glaucoma: Secondary | ICD-10-CM

## 2020-10-30 DIAGNOSIS — I10 Essential (primary) hypertension: Secondary | ICD-10-CM | POA: Diagnosis not present

## 2020-10-30 DIAGNOSIS — E2839 Other primary ovarian failure: Secondary | ICD-10-CM

## 2020-10-30 LAB — COMPREHENSIVE METABOLIC PANEL
ALT: 17 U/L (ref 0–35)
AST: 21 U/L (ref 0–37)
Albumin: 4.3 g/dL (ref 3.5–5.2)
Alkaline Phosphatase: 88 U/L (ref 39–117)
BUN: 22 mg/dL (ref 6–23)
CO2: 30 mEq/L (ref 19–32)
Calcium: 9.7 mg/dL (ref 8.4–10.5)
Chloride: 101 mEq/L (ref 96–112)
Creatinine, Ser: 0.92 mg/dL (ref 0.40–1.20)
GFR: 63.95 mL/min (ref >60.00)
Glucose, Bld: 106 mg/dL — ABNORMAL HIGH (ref 70–99)
Potassium: 3.6 mEq/L (ref 3.5–5.1)
Sodium: 139 mEq/L (ref 135–145)
Total Bilirubin: 0.5 mg/dL (ref 0.2–1.2)
Total Protein: 7.4 g/dL (ref 6.0–8.3)

## 2020-10-30 LAB — CBC
HCT: 37.9 % (ref 36.0–46.0)
Hemoglobin: 12.9 g/dL (ref 12.0–15.0)
MCHC: 34.1 g/dL (ref 30.0–36.0)
MCV: 87.1 fl (ref 78.0–100.0)
Platelets: 169 10*3/uL (ref 150.0–400.0)
RBC: 4.35 Mil/uL (ref 3.87–5.11)
RDW: 12.9 % (ref 11.5–15.5)
WBC: 9.8 10*3/uL (ref 4.0–10.5)

## 2020-10-30 LAB — LIPID PANEL
Cholesterol: 165 mg/dL (ref 0–200)
HDL: 80.2 mg/dL (ref >39.00)
LDL Cholesterol: 63 mg/dL (ref 0–99)
NonHDL: 84.3
Total CHOL/HDL Ratio: 2
Triglycerides: 109 mg/dL (ref 0.0–149.0)
VLDL: 21.8 mg/dL (ref 0.0–40.0)

## 2020-10-30 LAB — TSH: TSH: 0.54 u[IU]/mL (ref 0.35–4.50)

## 2020-10-30 NOTE — Telephone Encounter (Signed)
See below, do you still want pt to have this done? It says Dr. Jerline Pain is her PCP. I tried ordering the bone density but it keeps rejecting it and a waiver form pops up saying it is not covered.

## 2020-10-30 NOTE — Telephone Encounter (Signed)
Pt called stating that Dr Yong Channel wants to do a bone density test. She called stating that her mammogram is 9/19. She stated that Dr Yong Channel wanted know when it was scheduled so he can do the bone density test. The [name] fax number is [phone] is needed.

## 2020-10-30 NOTE — Assessment & Plan Note (Signed)
Follows with ophthalmology.  Not currently on any medications.

## 2020-10-30 NOTE — Patient Instructions (Signed)
It was very nice to see you today!  We will check blood work today.  No changes today.  I will order a bone density scan.  He can get this done at the same time as her mammogram.  I will see back in year.  Please come back to see me sooner if needed.  Take care, Dr Jerline Pain  PLEASE NOTE:  If you had any lab tests please let us know if you have not heard back within a few days. You may see your results on mychart before we have a chance to review them but we will give you a call once they are reviewed by [place]. If we ordered any referrals today, please let us know if you have not heard from their office within the next week.   Please try these tips to maintain a healthy lifestyle:  Eat at least 3 REAL meals and 1-2 snacks per day.  Aim for no more than 5 hours between eating.  If you eat breakfast, please do so within one hour of getting up.   Each meal should contain half fruits/vegetables, one quarter protein, and one quarter carbs (no bigger than a computer mouse)  Cut down on sweet beverages. This includes juice, soda, and sweet tea.   Drink at least 1 glass of water with each meal and aim for at least 8 glasses per day  Exercise at least 150 minutes every week.

## 2020-10-30 NOTE — Assessment & Plan Note (Signed)
At goal on amlodipine 10 mg daily and lisinopril-HCTZ 20-25 mg daily.  We will check labs.

## 2020-10-30 NOTE — Assessment & Plan Note (Signed)
Managed by endocrinology.  Check TSH.  She is on Synthroid 50 mcg daily.

## 2020-10-30 NOTE — Assessment & Plan Note (Signed)
Check labs.  She is on Lipitor 20 mg daily.  Tolerating well.

## 2020-10-30 NOTE — Assessment & Plan Note (Signed)
Managed by endocrinology.  On Trulicity 1.5 mg weekly.  Last A1c at goal.

## 2020-10-30 NOTE — Progress Notes (Signed)
Robin Rice is a 70 y.o. female who presents today for an office visit.  She is transferring care to [place] for her primary care.  Assessment/Plan:  Chronic Problems Addressed Today: Glaucoma, left eye Follows with ophthalmology.  Not currently on any medications.  Hyperlipidemia associated with type 2 diabetes mellitus ([place]) Check labs.  She is on Lipitor 20 mg daily.  Tolerating well.  Benign essential HTN At goal on amlodipine 10 mg daily and lisinopril-HCTZ 20-25 mg daily.  We will check labs.  Hypothyroid Managed by endocrinology.  Check TSH.  She is on Synthroid 50 mcg daily.  Controlled type 2 diabetes mellitus without complication, without long-term current use of insulin ([place]) Managed by endocrinology.  On Trulicity 1.5 mg weekly.  Last A1c at goal.  Preventative Healthcare Check labs today.  Will be getting mammogram later this year as well as a bone density scan.  No longer needs needs Pap smear due to age.  Due for colonoscopy in a couple of years.    Subjective:  HPI:  See A/P for status of chronic conditions  PMH:  The following were reviewed and entered/updated in epic: Past Medical History:  Diagnosis Date   Blood transfusion without reported diagnosis 2000   Cataract 2012   DM (diabetes mellitus) ([place])    Fibroadenoma    Glaucoma, Left Eye    Goiter    Heart murmur    Hyperlipidemia    Hypertension    Hypothyroid    Osteoarthritis, knee    Patient Active Problem List   Diagnosis Date Noted   Multinodular goiter 03/06/2020   Hypothyroid    Osteoarthritis, knee 09/20/2015   Controlled type 2 diabetes mellitus without complication, without long-term current use of insulin ([place]) 09/20/2015   Cardiac murmur 09/20/2015   Benign essential HTN    Hyperlipidemia associated with type 2 diabetes mellitus ([place])    Glaucoma, left eye    Past Surgical History:  Procedure Laterality Date   ABDOMINAL HYSTERECTOMY  2001   ABDOMINOPLASTY     CATARACT  EXTRACTION W/ INTRAOCULAR LENS  IMPLANT, BILATERAL     EYE SURGERY  2012   FRACTURE SURGERY  2019    Family History  Problem Relation Age of Onset   Heart disease Mother    Hypertension Father    Prostate cancer Father    Hypertension Brother    Hypertension Brother    Thyroid disease Neg Hx    Diabetes Neg Hx     Medications- reviewed and updated Current Outpatient Medications  Medication Sig Dispense Refill   amLODipine (NORVASC) 10 MG tablet TAKE 1 TABLET BY MOUTH EVERY DAY 90 tablet 1   aspirin 81 MG tablet Take 81 mg by mouth daily.     atorvastatin (LIPITOR) 20 MG tablet TAKE 1 TABLET BY MOUTH EVERY DAY 90 tablet 1   Cholecalciferol (VITAMIN D3) 125 MCG (5000 UT) TABS Take by mouth.     Dulaglutide (TRULICITY) 1.5 RK/2.7CW SOPN Inject 1.5 mg into the skin once a week. 6 mL 3   glucose blood (ONETOUCH VERIO) test strip 1 each by Other route once a week. And lancets 1/day 30 each 1   Lancets (ONETOUCH ULTRASOFT) lancets Used to check blood sugars daily. DX CODE E11.9. 100 each 12   levothyroxine (SYNTHROID) 50 MCG tablet TAKE 1 TABLET BY MOUTH EVERY DAY 90 tablet 1   lisinopril-hydrochlorothiazide (ZESTORETIC) 20-25 MG tablet TAKE 1 TABLET BY MOUTH EVERY DAY 90 tablet 1   No  current facility-administered medications for this visit.    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[employer]  Tobacco Use   Smoking status: Never   Smokeless tobacco: Never  Vaping Use   Vaping Use: Never used  Substance and Sexual Activity   Alcohol use: Yes    Alcohol/week: 1.0 standard drink    Types: 1 Glasses of wine per  week    Comment: Socially   Drug use: No   Sexual activity: Yes    Partners: Male    Birth control/protection: Surgical    Comment: TAH  Other Topics Concern   Not on file  Social History Narrative   Not on file   Social Determinants of Health   Financial Resource Strain: Low Risk    Difficulty of Paying Living Expenses: Not hard at all  Food Insecurity: No Food Insecurity   Worried About Charity fundraiser in the Last Year: Never true   [place] in the Last Year: Never true  Transportation Needs: No Transportation Needs   Lack of Transportation (Medical): No   Lack of Transportation (Non-Medical): No  Physical Activity: Sufficiently Active   Days of Exercise per Week: 4 days   Minutes of Exercise per Session: 90 min  Stress: No Stress Concern Present   Feeling of Stress : Not at all  Social Connections: Moderately Integrated   Frequency of Communication with Friends and Family: More than three times a week   Frequency of Social Gatherings with Friends and Family: Three times a week   Attends Religious Services: More than 4 times per year   Active Member of Clubs or Organizations: No   Attends Archivist Meetings: Never   Marital Status: Married          Objective:  Physical Exam: BP 134/71   Pulse 86   Temp 98.2 F (36.8 C) (Temporal)   Ht 5\' 4"  (1.626 m)   Wt 168 lb (76.2 kg)   LMP 05/06/1999   SpO2 100%   BMI 28.84 kg/m   Gen: No acute distress, resting comfortably CV: Regular rate and rhythm with no murmurs appreciated Pulm: Normal work of breathing, clear to auscultation bilaterally with no crackles, wheezes, or rhonchi Neuro: Grossly normal, moves all extremities Psych: Normal affect and thought content  Time Spent: 45 minutes of total time was spent on the date of the encounter performing the following actions: chart review prior to seeing the patient including notes from previous PCP and recent specialist visits, obtaining history,  performing a medically necessary exam, counseling on the treatment plan, placing orders, and documenting in our EHR.        Algis Greenhouse. Jerline Pain, MD 10/30/2020 2:58 PM

## 2020-10-31 ENCOUNTER — Encounter: Payer: Self-pay | Admitting: Family Medicine

## 2020-10-31 NOTE — Progress Notes (Signed)
Please inform patient of the following:  Blood work all within normal ranges. Do not need to make any changes to her treatment plan at this time. Would like for her to keep up the good work and we can recheck in a year or so.  Algis Greenhouse. Jerline Pain, MD 10/31/2020 8:20 AM

## 2020-10-31 NOTE — Telephone Encounter (Signed)
Is this ok to order? 

## 2020-10-31 NOTE — Telephone Encounter (Signed)
This order was already put in yesterday when she was here for her visit.  Algis Greenhouse. Jerline Pain, MD 10/31/2020 8:16 AM

## 2020-11-15 ENCOUNTER — Telehealth: Payer: Self-pay | Admitting: Family Medicine

## 2020-11-15 NOTE — Chronic Care Management (AMB) (Signed)
  Chronic Care Management   Note  11/15/2020 Name: MARIO VOONG MRN: [id number] DOB: [birth year]  BEATRYCE COLOMBO is a 71 y.o. year old female who is a primary care patient of Vivi Barrack, MD. I reached out to Nolon Nations by phone today in response to a referral sent by Ms. Melanee Spry Kyser's PCP, Vivi Barrack, MD.   Ms. Rozell was given information about Chronic Care Management services today including:  CCM service includes personalized support from designated clinical staff supervised by her physician, including individualized plan of care and coordination with other care providers 24/7 contact phone numbers for assistance for urgent and routine care needs. Service will only be billed when office clinical staff spend 20 minutes or more in a month to coordinate care. Only one practitioner may furnish and bill the service in a calendar month. The patient may stop CCM services at any time (effective at the end of the month) by phone call to the office staff.   Patient agreed to services and verbal consent obtained.   Follow up plan:   Lauretta Grill Upstream Scheduler

## 2020-12-02 ENCOUNTER — Other Ambulatory Visit: Payer: Self-pay | Admitting: Family Medicine

## 2020-12-02 DIAGNOSIS — E1159 Type 2 diabetes mellitus with other circulatory complications: Secondary | ICD-10-CM

## 2020-12-02 DIAGNOSIS — I152 Hypertension secondary to endocrine disorders: Secondary | ICD-10-CM

## 2020-12-04 ENCOUNTER — Other Ambulatory Visit: Payer: Self-pay

## 2020-12-20 ENCOUNTER — Telehealth: Payer: Self-pay | Admitting: Pharmacist

## 2020-12-20 NOTE — Chronic Care Management (AMB) (Addendum)
Chronic Care Management Pharmacy Assistant   Name: Robin Rice  MRN: [id number] DOB: [birth year]  Robin Rice is an 70 y.o. year old female who presents for his initial CCM visit with the clinical pharmacist.  Reason for Encounter: Chart Prep For Initial Visit With Clinical Pharmacist.   Conditions to be addressed/monitored: HTN, HLD, DM, Hypothyroidism, Osteoarthritis, Glaucoma, Cardiac murmur  Primary concerns for visit include: HTN, HLD, DM, Hypothyroidism  Recent office visits:  10/30/2020 Earle Gell, MD; chronic follow up , no medication changes indicated.  Recent consult visits:  09/06/2020 [facility] (endocrinology) Renato Shin, MD; increase Trulicity, follow up in 4 months  Hospital visits:  None in previous 6 months  Medications: Outpatient Encounter Medications as of 12/20/2020  Medication Sig Note   amLODipine (NORVASC) 10 MG tablet TAKE 1 TABLET BY MOUTH EVERY DAY    lisinopril-hydrochlorothiazide (ZESTORETIC) 20-25 MG tablet TAKE 1 TABLET BY MOUTH EVERY DAY    aspirin 81 MG tablet Take 81 mg by mouth daily.    atorvastatin (LIPITOR) 20 MG tablet TAKE 1 TABLET BY MOUTH EVERY DAY    Cholecalciferol (VITAMIN D3) 125 MCG (5000 UT) TABS Take by mouth. 10/26/2019: Every other day   Dulaglutide (TRULICITY) 1.5 0000000 SOPN Inject 1.5 mg into the skin once a week.    glucose blood (ONETOUCH VERIO) test strip 1 each by Other route once a week. And lancets 1/day    Lancets (ONETOUCH ULTRASOFT) lancets Used to check blood sugars daily. DX CODE E11.9.    levothyroxine (SYNTHROID) 50 MCG tablet TAKE 1 TABLET BY MOUTH EVERY DAY    No facility-administered encounter medications on file as of 12/20/2020.   Current Medications: Amlodipine 10 mg last filled 12/05/2020 90 DS Atorvastatin 20 mg last filled Q000111Q 90 DS Trulicity 1.5 99991111 ml last filled 12/03/2020 84 DS Lisinopril-HCTZ 20-25 mg last filled 12/05/2020 90 DS Levothyroxine 50 mcg last filled  11/27/2020 90 DS Vitamin D3 5000iu Aspirin 81 mg  Patient Questions: Any changes in your medications or health? Patient states she has not had any changes in her medications or health recently.  Any side effects from any medications?  Patient denies any side effects from any of her medications that she is aware of.  Do you have any symptoms or problems not managed by your medications? Patient states she does not have any symptoms or problems not managed by her medications.  Any concerns about your health right now? Patient states she does not have any concerns about her health right now.  Has your provider asked that you check blood pressure, blood sugar, or follow special diet at home? Patient states she does not check her blood pressure. She is supposed to check her blood glucose but she does not. Patient states she does not follow a specific diet but she is trying to lose weight.  Do you get any type of exercise on a regular basis? Patient states she walks every other day.  Can you think of a goal you would like to reach for your health? Patient states she would like to get down to 150 lb.  Do you have any problems getting your medications? Patient states she does not have any problems getting her medications.  Is there anything that you would like to discuss during the appointment?  Patient states she does not have anything she would like to discuss.  Please bring medications and supplements to appointment.   Future Appointments  Date Time Provider [place]  12/24/2020 11:00 AM [facility] CCM PHARMACIST [facility]  01/10/2021 10:30 AM Renato Shin, MD [facility] None  10/31/2021  8:00 AM Vivi Barrack, MD [facility]  10/31/2021  8:45 AM [facility] HEALTH COACH [facility]    Star Rating Drugs: Atorvastatin 20 mg last filled Q000111Q 90 DS Trulicity 1.5 99991111 ml last filled 12/03/2020 84 DS Lisinopril-HCTZ 20-25 mg last filled 12/05/2020 90 DS  April D  Cal[place], [place] Pharmacist Assistant [phone]

## 2020-12-24 ENCOUNTER — Ambulatory Visit (INDEPENDENT_AMBULATORY_CARE_PROVIDER_SITE_OTHER): Payer: Medicare Other | Admitting: Pharmacist

## 2020-12-24 DIAGNOSIS — E785 Hyperlipidemia, unspecified: Secondary | ICD-10-CM | POA: Diagnosis not present

## 2020-12-24 DIAGNOSIS — I10 Essential (primary) hypertension: Secondary | ICD-10-CM

## 2020-12-24 DIAGNOSIS — E038 Other specified hypothyroidism: Secondary | ICD-10-CM

## 2020-12-24 DIAGNOSIS — E1169 Type 2 diabetes mellitus with other specified complication: Secondary | ICD-10-CM

## 2020-12-24 DIAGNOSIS — E119 Type 2 diabetes mellitus without complications: Secondary | ICD-10-CM

## 2020-12-24 NOTE — Patient Instructions (Addendum)
Visit Information   Goals Addressed             This Visit's Progress    Track and Manage My Blood Pressure-Hypertension       Timeframe:  Long-Range Goal Priority:  High Start Date:    12/24/20                         Expected End Date:   06/26/21                    Follow Up Date 04/03/21    - check blood pressure weekly - choose a place to take my blood pressure (home, clinic or office, retail store) - write blood pressure results in a log or diary    Why is this important?   You won't feel high blood pressure, but it can still hurt your blood vessels.  High blood pressure can cause heart or kidney problems. It can also cause a stroke.  Making lifestyle changes like losing a little weight or eating less salt will help.  Checking your blood pressure at home and at different times of the day can help to control blood pressure.  If the doctor prescribes medicine remember to take it the way the doctor ordered.  Call the office if you cannot afford the medicine or if there are questions about it.     Notes:        Patient Care Plan: General Pharmacy (Adult)     Problem Identified: HTN, HLD, Type II DM, Hypothyroidism   Priority: High  Onset Date: 12/24/2020     Long-Range Goal: Patient-Specific Goal   Start Date: 12/24/2020  Expected End Date: 06/26/2021  This Visit's Progress: On track  Priority: High  Note:   Current Barriers:  None at this time  Pharmacist Clinical Goal(s):  Patient will maintain control of BP and glucose as evidenced by labs  through collaboration with PharmD and provider.   Interventions: 1:1 collaboration with Vivi Barrack, MD regarding development and update of comprehensive plan of care as evidenced by provider attestation and co-signature Inter-disciplinary care team collaboration (see longitudinal plan of care) Comprehensive medication review performed; medication list updated in electronic medical record  Hypertension (BP goal  <130/80) -Controlled -Current treatment: Amlodipine '10mg'$  daily Lisinopril-HCTZ 20-'25mg'$  daily -Medications previously tried: none noted  -Current home readings: not checking at home -Current dietary habits: she has put on some weight lately but plans to get back on track with her diet -Current exercise habits: walks 5 miles 3-4 times per week -Denies hypotensive/hypertensive symptoms -Educated on BP goals and benefits of medications for prevention of heart attack, stroke and kidney damage; Daily salt intake goal < 2300 mg; Exercise goal of 150 minutes per week; Symptoms of hypotension and importance of maintaining adequate hydration; -Counseled to monitor BP at home periodically, document, and provide log at future appointments -Recommend monitor BP, if any low BP at all contact providers.  Hyperlipidemia: (LDL goal < 70) -Controlled -Current treatment: Atorvastatin '20mg'$  daily -Medications previously tried: none noted  -Most recent lipid panel is excellent -Educated on Cholesterol goals;  Benefits of statin for ASCVD risk reduction; Importance of limiting foods high in cholesterol; -Recommended to continue current medication  Diabetes (A1c goal <7%) -Controlled -Current medications: Trulicity 1.'5mg'$  once weekly -Medications previously tried: metformin, Januvia  -Current home glucose readings fasting glucose: not checking post prandial glucose: not checking -Denies hypoglycemic/hyperglycemic symptoms -Current meal patterns: patient eating  larger portions lately, high carb content in foods -Current exercise: walking 5 miles 3-4 times per week -Educated on A1c and blood sugar goals; Prevention and management of hypoglycemic episodes; Carbohydrate counting and/or plate method One carb source per meal -Counseled to check feet daily and get yearly eye exams -Recommended to continue current medication She plans on eating better and losing weight, counseled to keep a good eye on  glucose to avoid hypoglycemia.  Hypothyroidism (Goal: Maintain TSH) -Controlled -Current treatment  Levothyroxine 52mg daily -Medications previously tried: none noted -TSH is WNL  -Recommended to continue current medication Counseled on proper medication administration 30-60 minutes before food on empty stomach if possible.  Patient Goals/Self-Care Activities Patient will:  - take medications as prescribed focus on medication adherence by pill box target a minimum of 150 minutes of moderate intensity exercise weekly  Follow Up Plan: The care management team will reach out to the patient again over the next 80 days.       Ms. WKrishnaswamywas given information about Chronic Care Management services today including:  CCM service includes personalized support from designated clinical staff supervised by her physician, including individualized plan of care and coordination with other care providers 24/7 contact phone numbers for assistance for urgent and routine care needs. Standard insurance, coinsurance, copays and deductibles apply for chronic care management only during months in which we provide at least 20 minutes of these services. Most insurances cover these services at 100%, however patients may be responsible for any copay, coinsurance and/or deductible if applicable. This service may help you avoid the need for more expensive face-to-face services. Only one practitioner may furnish and bill the service in a calendar month. The patient may stop CCM services at any time (effective at the end of the month) by phone call to the office staff.  Patient agreed to services and verbal consent obtained.   The patient verbalized understanding of instructions, educational materials, and care plan provided today and agreed to receive a mailed copy of patient instructions, educational materials, and care plan.  Telephone follow up appointment with pharmacy team member scheduled for: 6  months  CEdythe Clarity [place]

## 2020-12-24 NOTE — Progress Notes (Signed)
Chronic Care Management Pharmacy Note  12/24/2020 Name:  Robin Rice MRN:  [id number] DOB:  [birth year]  Summary: Initial visit with PharmD.  Meds reviewed and updated.  Patient reports she feels great with no concern.  She is working to limit her carbs and get back on track with her diet.  Recommendations/Changes made from today's visit: None at this time  Plan: FU 6 months   Subjective: Robin Rice is an 70 y.o. year old female who is a primary patient of Jerline Pain, Algis Greenhouse, MD.  The CCM team was consulted for assistance with disease management and care coordination needs.    Engaged with patient by telephone for initial visit in response to provider referral for pharmacy case management and/or care coordination services.   Consent to Services:  The patient was given the following information about Chronic Care Management services today, agreed to services, and gave verbal consent: 1. CCM service includes personalized support from designated clinical staff supervised by the primary care provider, including individualized plan of care and coordination with other care providers 2. 24/7 contact phone numbers for assistance for urgent and routine care needs. 3. Service will only be billed when office clinical staff spend 20 minutes or more in a month to coordinate care. 4. Only one practitioner may furnish and bill the service in a calendar month. 5.The patient may stop CCM services at any time (effective at the end of the month) by phone call to the office staff. 6. The patient will be responsible for cost sharing (co-pay) of up to 20% of the service fee (after annual deductible is met). Patient agreed to services and consent obtained.  Patient Care Team: Vivi Barrack, MD as PCP - General (Family Medicine) Salvadore Dom, MD as Consulting Physician (Obstetrics and Gynecology) Renato Shin, MD as Consulting Physician (Endocrinology) Edythe Clarity, [name] as Pharmacist  (Pharmacist)  Recent office visits:  10/30/2020 Earle Gell, MD; chronic follow up , no medication changes indicated.   Recent consult visits:  09/06/2020 OV (endocrinology) Renato Shin, MD; increase Trulicity, follow up in 4 months   Hospital visits:  None in previous 6 months   Objective:  Lab Results  Component Value Date   CREATININE 0.92 10/30/2020   BUN 22 10/30/2020   GFR 63.95 10/30/2020   NA 139 10/30/2020   K 3.6 10/30/2020   CALCIUM 9.7 10/30/2020   CO2 30 10/30/2020   GLUCOSE 106 (H) 10/30/2020    Lab Results  Component Value Date/Time   HGBA1C 5.5 09/06/2020 10:33 AM   HGBA1C 5.7 (A) 06/07/2020 10:42 AM   HGBA1C 5.3 10/26/2019 10:36 AM   HGBA1C 5.5 05/16/2019 11:18 AM   HGBA1C 6.5 04/15/2016 12:00 AM   GFR 63.95 10/30/2020 02:54 PM   GFR 85.19 10/26/2019 10:36 AM    Last diabetic Eye exam:  Lab Results  Component Value Date/Time   HMDIABEYEEXA No Retinopathy 10/26/2017 12:00 AM    Last diabetic Foot exam: No results found for: HMDIABFOOTEX   Lab Results  Component Value Date   CHOL 165 10/30/2020   HDL 80.20 10/30/2020   LDLCALC 63 10/30/2020   TRIG 109.0 10/30/2020   CHOLHDL 2 10/30/2020    Hepatic Function Latest Ref Rng & Units 10/30/2020 10/26/2019 05/16/2019  Total Protein 6.0 - 8.3 g/dL 7.4 7.5 7.5  Albumin 3.5 - 5.2 g/dL 4.3 4.6 4.4  AST 0 - 37 U/L _0 ALT 0 - 35 U/L 17 17 19  Alk Phosphatase 39 - 117 U/L 88 80 87  Total Bilirubin 0.2 - 1.2 mg/dL 0.5 0.8 0.5    Lab Results  Component Value Date/Time   TSH 0.54 10/30/2020 02:54 PM   TSH 1.15 10/26/2019 10:36 AM   FREET4 1.06 10/26/2019 10:36 AM   FREET4 1.17 05/04/2018 10:36 AM    CBC Latest Ref Rng & Units 10/30/2020 10/26/2019 05/16/2019  WBC 4.0 - 10.5 K/uL 9.8 9.3 8.8  Hemoglobin 12.0 - 15.0 g/dL 12.9 13.5 12.8  Hematocrit 36.0 - 46.0 % 37.9 40.7 38.7  Platelets 150.0 - 400.0 K/uL 169.0 167.0 190.0    Lab Results  Component Value Date/Time   VD25OH 76.10  10/25/2018 10:06 AM   VD25OH 18.97 (L) 10/30/2017 09:21 AM    Clinical ASCVD: No  The 10-year ASCVD risk score Mikey Bussing DC Jr., et al., 2013) is: 22%   Values used to calculate the score:     Age: 46 years     Sex: Female     Is Non-Hispanic African American: Yes     Diabetic: Yes     Tobacco smoker: No     Systolic Blood Pressure: 373 mmHg     Is BP treated: Yes     HDL Cholesterol: 80.2 mg/dL     Total Cholesterol: 165 mg/dL    Depression screen [facility] 2/9 10/30/2020 10/26/2020 10/26/2019  Decreased Interest 0 0 0  Down, Depressed, Hopeless 0 0 0  PHQ - 2 Score 0 0 0  Altered sleeping - - -  Tired, decreased energy - - -  Change in appetite - - -  Feeling bad or failure about yourself  - - -  Trouble concentrating - - -  Moving slowly or fidgety/restless - - -  Suicidal thoughts - - -  PHQ-9 Score - - -  Difficult doing work/chores - - -     Social History   Tobacco Use  Smoking Status Never  Smokeless Tobacco Never   BP Readings from Last 3 Encounters:  10/30/20 134/71  10/26/20 128/64  09/06/20 (!) 180/86   Pulse Readings from Last 3 Encounters:  10/30/20 86  10/26/20 (!) 103  09/06/20 91   Wt Readings from Last 3 Encounters:  10/30/20 168 lb (76.2 kg)  10/26/20 167 lb 9.6 oz (76 kg)  09/06/20 168 lb 3.2 oz (76.3 kg)   BMI Readings from Last 3 Encounters:  10/30/20 28.84 kg/m  10/26/20 28.77 kg/m  09/06/20 28.87 kg/m    Assessment/Interventions: Review of patient past medical history, allergies, medications, health status, including review of consultants reports, laboratory and other test data, was performed as part of comprehensive evaluation and provision of chronic care management services.   SDOH:  (Social Determinants of Health) assessments and interventions performed: Yes  Financial Resource Strain: Low Risk    Difficulty of Paying Living Expenses: Not hard at all    SDOH Screenings   Alcohol Screen: Not on file  Depression (PHQ2-9): Low Risk     PHQ-2 Score: 0  Financial Resource Strain: Low Risk    Difficulty of Paying Living Expenses: Not hard at all  Food Insecurity: No Food Insecurity   Worried About Charity fundraiser in the Last Year: Never true   Ran Out of Food in the Last Year: Never true  Housing: Low Risk    Last Housing Risk Score: 0  Physical Activity: Sufficiently Active   Days of Exercise per Week: 4 days   Minutes of Exercise per Session:  90 min  Social Connections: Moderately Integrated   Frequency of Communication with Friends and Family: More than three times a week   Frequency of Social Gatherings with Friends and Family: Three times a week   Attends Religious Services: More than 4 times per year   Active Member of Clubs or Organizations: No   Attends Archivist Meetings: Never   Marital Status: Married  Stress: No Stress Concern Present   Feeling of Stress : Not at all  Tobacco Use: Low Risk    Smoking Tobacco Use: Never   Smokeless Tobacco Use: Never  Transportation Needs: No Transportation Needs   Lack of Transportation (Medical): No   Lack of Transportation (Non-Medical): No    CCM Care Plan  Allergies  Allergen Reactions   Other     Medications Reviewed Today     Reviewed by Edythe Clarity, [facility] (Pharmacist) on 12/24/20 at 1148  Med List Status: <None>   Medication Order Taking? Sig Documenting Provider Last Dose Status Informant  amLODipine (NORVASC) 10 MG tablet [id number] Yes TAKE 1 TABLET BY MOUTH EVERY DAY Vivi Barrack, MD Taking Active   aspirin 81 MG tablet [id number] Yes Take 81 mg by mouth daily. [provider] Taking Active   atorvastatin (LIPITOR) 20 MG tablet [id number] Yes TAKE 1 TABLET BY MOUTH EVERY DAY Orma Flaming, MD Taking Active   Cholecalciferol (VITAMIN D3) 125 MCG (5000 UT) TABS [id number] Yes Take by mouth. [provider] Taking Active            Med Note Marcelline Deist, MELITTA D   Wed Oct 26, 2019  9:48 AM) Every other day   Dulaglutide (TRULICITY) 1.5 IO/2.7OJ SOPN [id number] Yes Inject 1.5 mg into the skin once a week. Renato Shin, MD Taking Active   glucose blood [facility] VERIO) test strip [id number] Yes 1 each by Other route once a week. And lancets 1/day Renato Shin, MD Taking Active   Lancets [facility] ULTRASOFT) lancets [id number]  Used to check blood sugars daily. DX CODE E11.9. Renato Shin, MD  Active   levothyroxine (SYNTHROID) 50 MCG tablet [id number]  TAKE 1 TABLET BY MOUTH EVERY DAY Orma Flaming, MD  Active   lisinopril-hydrochlorothiazide (ZESTORETIC) 20-25 MG tablet [id number] Yes TAKE 1 TABLET BY MOUTH EVERY DAY Vivi Barrack, MD Taking Active             Patient Active Problem List   Diagnosis Date Noted   Multinodular goiter 03/06/2020   Hypothyroid    Osteoarthritis, knee 09/20/2015   Controlled type 2 diabetes mellitus without complication, without long-term current use of insulin ([place]) 09/20/2015   Cardiac murmur 09/20/2015   Benign essential HTN    Hyperlipidemia associated with type 2 diabetes mellitus ([place])    Glaucoma, left eye     Immunization History  Administered Date(s) Administered   Fluad Quad(high Dose 65+) 01/06/2019   Influenza, High Dose Seasonal PF 02/01/2018   Influenza,inj,Quad PF,6+ Mos 01/06/2017   Influenza-Unspecified 02/15/2015, 01/07/2016, 01/16/2020   PFIZER(Purple Top)SARS-COV-2 Vaccination 06/12/2019, 07/07/2019, 08/27/2020   Pneumococcal Conjugate-13 02/15/2015   Pneumococcal Polysaccharide-23 09/15/2017   Tdap 05/18/2007, 02/01/2018   Zoster Recombinat (Shingrix) 10/31/2017, 02/08/2018   Zoster, Live 10/07/2012    Conditions to be addressed/monitored:  HTN, HLD, Type II DM, Hypothyroidism  Care Plan : General Pharmacy (Adult)  Updates made by Edythe Clarity, RPH since 12/24/2020 12:00 AM     Problem: HTN, HLD, Type II DM, Hypothyroidism   Priority: High  Onset Date:  12/24/2020     Long-Range Goal: Patient-Specific Goal   Start  Date: 12/24/2020  Expected End Date: 06/26/2021  This Visit's Progress: On track  Priority: High  Note:   Current Barriers:  None at this time  Pharmacist Clinical Goal(s):  Patient will maintain control of BP and glucose as evidenced by labs  through collaboration with PharmD and provider.   Interventions: 1:1 collaboration with Vivi Barrack, MD regarding development and update of comprehensive plan of care as evidenced by provider attestation and co-signature Inter-disciplinary care team collaboration (see longitudinal plan of care) Comprehensive medication review performed; medication list updated in electronic medical record  Hypertension (BP goal <130/80) -Controlled -Current treatment: Amlodipine 58m daily Lisinopril-HCTZ 20-239mdaily -Medications previously tried: none noted  -Current home readings: not checking at home -Current dietary habits: she has put on some weight lately but plans to get back on track with her diet -Current exercise habits: walks 5 miles 3-4 times per week -Denies hypotensive/hypertensive symptoms -Educated on BP goals and benefits of medications for prevention of heart attack, stroke and kidney damage; Daily salt intake goal < 2300 mg; Exercise goal of 150 minutes per week; Symptoms of hypotension and importance of maintaining adequate hydration; -Counseled to monitor BP at home periodically, document, and provide log at future appointments -Recommend monitor BP, if any low BP at all contact providers.  Hyperlipidemia: (LDL goal < 70) -Controlled -Current treatment: Atorvastatin 2019maily -Medications previously tried: none noted  -Most recent lipid panel is excellent -Educated on Cholesterol goals;  Benefits of statin for ASCVD risk reduction; Importance of limiting foods high in cholesterol; -Recommended to continue current medication  Diabetes (A1c goal <7%) -Controlled -Current medications: Trulicity 1.52.1HYce  weekly -Medications previously tried: metformin, Januvia  -Current home glucose readings fasting glucose: not checking post prandial glucose: not checking -Denies hypoglycemic/hyperglycemic symptoms -Current meal patterns: patient eating larger portions lately, high carb content in foods -Current exercise: walking 5 miles 3-4 times per week -Educated on A1c and blood sugar goals; Prevention and management of hypoglycemic episodes; Carbohydrate counting and/or plate method One carb source per meal -Counseled to check feet daily and get yearly eye exams -Recommended to continue current medication She plans on eating better and losing weight, counseled to keep a good eye on glucose to avoid hypoglycemia.  Hypothyroidism (Goal: Maintain TSH) -Controlled -Current treatment  Levothyroxine 63m73maily -Medications previously tried: none noted -TSH is WNL  -Recommended to continue current medication Counseled on proper medication administration 30-60 minutes before food on empty stomach if possible.  Patient Goals/Self-Care Activities Patient will:  - take medications as prescribed focus on medication adherence by pill box target a minimum of 150 minutes of moderate intensity exercise weekly  Follow Up Plan: The care management team will reach out to the patient again over the next 80 days.        Medication Assistance: None required.  Patient affirms current coverage meets needs.  Compliance/Adherence/Medication fill history: Care Gaps: Eye exam - diabetes  Star-Rating Drugs: Amlodipine 10 mg last filled 12/05/2020 90 DS Atorvastatin 20 mg last filled 07/286/57/8469DS Trulicity 1.5 mg/0GE/9.5last filled 12/03/2020 84 DS Lisinopril-HCTZ 20-25 mg last filled 12/05/2020 90 DS  Patient's [facility] pharmacy [facility]:  [id number]  - [address] [place]: [phone] pill box? Yes Pt  endorses 100% compliance  We discussed: Benefits of medication synchronization, packaging and delivery as well as enhanced  pharmacist oversight with [facility]. Patient decided to: Utilize [facility] pharmacy for medication synchronization, packaging and delivery  Care Plan and Follow Up Patient Decision:  Patient agrees to Care Plan and Follow-up.  Plan: The care management team will reach out to the patient again over the next 180 days.  Marland Kitchen

## 2021-01-10 ENCOUNTER — Ambulatory Visit: Payer: Medicare Other | Admitting: Endocrinology

## 2021-01-14 ENCOUNTER — Telehealth: Payer: Self-pay | Admitting: Pharmacist

## 2021-01-14 NOTE — Progress Notes (Signed)
    Chronic Care Management Pharmacy Assistant   Name: Robin Rice  MRN: [id number] DOB: March 22, 1951  Reason for Encounter: CCM Care Plan  Medications: Outpatient Encounter Medications as of 01/14/2021  Medication Sig Note   amLODipine (NORVASC) 10 MG tablet TAKE 1 TABLET BY MOUTH EVERY DAY    aspirin 81 MG tablet Take 81 mg by mouth daily.    atorvastatin (LIPITOR) 20 MG tablet TAKE 1 TABLET BY MOUTH EVERY DAY    Cholecalciferol (VITAMIN D3) 125 MCG (5000 UT) TABS Take by mouth. 10/26/2019: Every other day   Dulaglutide (TRULICITY) 1.5 0000000 SOPN Inject 1.5 mg into the skin once a week.    glucose blood (ONETOUCH VERIO) test strip 1 each by Other route once a week. And lancets 1/day    Lancets (ONETOUCH ULTRASOFT) lancets Used to check blood sugars daily. DX CODE E11.9.    levothyroxine (SYNTHROID) 50 MCG tablet TAKE 1 TABLET BY MOUTH EVERY DAY    lisinopril-hydrochlorothiazide (ZESTORETIC) 20-25 MG tablet TAKE 1 TABLET BY MOUTH EVERY DAY    No facility-administered encounter medications on file as of 01/14/2021.   Reviewed the patients initial visit reinsured it was completed per the pharmacist Leata Mouse request. Printed the CCM care plan. Mailed the patient CCM care plan to their most recent address on file.  Follow-Up:Pharmacist Review  Charlann Lange, [place] Pharmacist Assistant [phone]

## 2021-01-24 LAB — HM DEXA SCAN: HM Dexa Scan: NEGATIVE

## 2021-01-24 LAB — HM MAMMOGRAPHY

## 2021-01-30 ENCOUNTER — Encounter: Payer: Self-pay | Admitting: Family Medicine

## 2021-02-01 ENCOUNTER — Encounter: Payer: Self-pay | Admitting: Family Medicine

## 2021-02-15 ENCOUNTER — Other Ambulatory Visit: Payer: Self-pay

## 2021-02-15 ENCOUNTER — Ambulatory Visit (INDEPENDENT_AMBULATORY_CARE_PROVIDER_SITE_OTHER): Payer: Medicare Other | Admitting: Endocrinology

## 2021-02-15 VITALS — BP 152/60 | HR 107 | Ht 64.0 in | Wt 167.2 lb

## 2021-02-15 DIAGNOSIS — E119 Type 2 diabetes mellitus without complications: Secondary | ICD-10-CM

## 2021-02-15 LAB — POCT GLYCOSYLATED HEMOGLOBIN (HGB A1C): Hemoglobin A1C: 5.6 % (ref 4.0–5.6)

## 2021-02-15 MED ORDER — TRULICITY 3 MG/0.5ML ~~LOC~~ SOAJ: 3.0000 mg | SUBCUTANEOUS | 3 refills | Status: DC

## 2021-02-15 NOTE — Patient Instructions (Addendum)
Your blood pressure is high today.  Please see your primary care provider soon, to have it rechecked.   I have sent a prescription to your pharmacy, to increase the Trulicity again.   Please come back for a follow-up appointment in 6 months.

## 2021-02-15 NOTE — Progress Notes (Signed)
Subjective:    Patient ID: Robin Rice, female    DOB: 10-09-1950, 70 y.o.   MRN: [id number]  HPI Pt returns for f/u of diabetes mellitus:  DM type: 2 Dx'ed: 5093 Complications: none Therapy: Trulicity.   GDM: never DKA: never Severe hypoglycemia: never.   Pancreatitis: never Pancreatic imaging: never  Other: she has never been on insulin; edema limits rx options; PCP stopped metformin.  Interval history: pt states she feels well in general.  She says cbg's are well-controlled.   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  Past Medical History:  Diagnosis Date   Blood transfusion without reported diagnosis 2000   Cataract 2012   DM (diabetes mellitus) ([place])    Fibroadenoma    Glaucoma, Left Eye    Goiter    Heart murmur    Hyperlipidemia    Hypertension    Hypothyroid    Osteoarthritis, knee     Past Surgical History:  Procedure Laterality Date   ABDOMINAL HYSTERECTOMY  2001   ABDOMINOPLASTY     CATARACT EXTRACTION W/ INTRAOCULAR LENS  IMPLANT, BILATERAL     EYE SURGERY  2012   FRACTURE SURGERY  2019    Social History   Socioeconomic History   Marital status: Married    Spouse name: Not on file   Number of children: Not on file   Years of education: Not on file   Highest education level: Not on file  Occupational History   Occupation: Programmer, systems: [employer]   Occupation: Retired    Fish farm manager: [place] [employer]  Tobacco Use   Smoking status: Never   Smokeless tobacco: Never  Vaping Use   Vaping Use: Never used  Substance and Sexual Activity   Alcohol use: Yes    Alcohol/week: 1.0 standard drink    Types: 1 Glasses of wine per week    Comment: Socially   Drug use: No   Sexual activity: Yes    Partners: Male    Birth control/protection: Surgical    Comment: TAH  Other Topics Concern   Not on file  Social  History Narrative   Not on file   Social Determinants of Health   Financial Resource Strain: Low Risk    Difficulty of Paying Living Expenses: Not hard at all  Food Insecurity: No Food Insecurity   Worried About Charity fundraiser in the Last Year: Never true   [place] in the Last Year: Never true  Transportation Needs: No Transportation Needs   Lack of Transportation (Medical): No   Lack of Transportation (Non-Medical): No  Physical Activity: Sufficiently Active   Days of Exercise per Week: 4 days   Minutes of Exercise per Session: 90 min  Stress: No Stress Concern Present   Feeling of Stress : Not at all  Social Connections: Moderately Integrated   Frequency of Communication with Friends and Family: More than three times a week   Frequency of Social Gatherings with Friends and Family: Three times a week   Attends Religious Services: More than 4 times per year   Active Member of Clubs or Organizations: No   Attends Archivist Meetings: Never   Marital Status: Married  Human resources officer Violence: Not At Risk   Fear of Current or Ex-Partner: No   Emotionally Abused: No   Physically Abused: No  Sexually Abused: No    Current Outpatient Medications on File Prior to Visit  Medication Sig Dispense Refill   amLODipine (NORVASC) 10 MG tablet TAKE 1 TABLET BY MOUTH EVERY DAY 90 tablet 1   aspirin 81 MG tablet Take 81 mg by mouth daily.     atorvastatin (LIPITOR) 20 MG tablet TAKE 1 TABLET BY MOUTH EVERY DAY 90 tablet 1   Cholecalciferol (VITAMIN D3) 125 MCG (5000 UT) TABS Take by mouth.     glucose blood (ONETOUCH VERIO) test strip 1 each by Other route once a week. And lancets 1/day 30 each 1   Lancets (ONETOUCH ULTRASOFT) lancets Used to check blood sugars daily. DX CODE E11.9. 100 each 12   levothyroxine (SYNTHROID) 50 MCG tablet TAKE 1 TABLET BY MOUTH EVERY DAY 90 tablet 1   lisinopril-hydrochlorothiazide (ZESTORETIC) 20-25 MG tablet TAKE 1 TABLET BY MOUTH  EVERY DAY 90 tablet 1   No current facility-administered medications on file prior to visit.    Allergies  Allergen Reactions   Other     Family History  Problem Relation Age of Onset   Heart disease Mother    Hypertension Father    Prostate cancer Father    Hypertension Brother    Hypertension Brother    Thyroid disease Neg Hx    Diabetes Neg Hx     BP (!) 152/60 (BP Location: Right Arm, Patient Position: Sitting, Cuff Size: Large)   Pulse (!) 107   Ht 5\' 4"  (1.626 m)   Wt 167 lb 3.2 oz (75.8 kg)   LMP 05/06/1999   SpO2 97%   BMI 28.70 kg/m    Review of Systems Denies N/V/HB    Objective:   Physical Exam Pulses: dorsalis pedis intact bilat.   MSK: no deformity of the feet CV: no leg edema Skin:  no ulcer on the feet.  normal color and temp on the feet. Neuro: sensation is intact to touch on the feet.    Lab Results  Component Value Date   HGBA1C 5.6 02/15/2021    Lab Results  Component Value Date   CREATININE 0.92 10/30/2020   BUN 22 10/30/2020   NA 139 10/30/2020   K 3.6 10/30/2020   CL 101 10/30/2020   CO2 30 10/30/2020      Assessment & Plan:  Type 2 DM: well-controlled Obesity: uncontrolled.    Patient Instructions  Your blood pressure is high today.  Please see your primary care provider soon, to have it rechecked.   I have sent a prescription to your pharmacy, to increase the Trulicity again.   Please come back for a follow-up appointment in 6 months.

## 2021-03-02 ENCOUNTER — Other Ambulatory Visit: Payer: Self-pay | Admitting: Family Medicine

## 2021-03-02 DIAGNOSIS — E782 Mixed hyperlipidemia: Secondary | ICD-10-CM

## 2021-04-20 ENCOUNTER — Other Ambulatory Visit: Payer: Self-pay | Admitting: Endocrinology

## 2021-04-20 ENCOUNTER — Other Ambulatory Visit: Payer: Self-pay | Admitting: Family Medicine

## 2021-04-26 ENCOUNTER — Other Ambulatory Visit: Payer: Self-pay | Admitting: Family Medicine

## 2021-04-26 DIAGNOSIS — E1159 Type 2 diabetes mellitus with other circulatory complications: Secondary | ICD-10-CM

## 2021-05-08 ENCOUNTER — Telehealth: Payer: Self-pay | Admitting: Endocrinology

## 2021-05-08 NOTE — Telephone Encounter (Signed)
MEDICATION: Dulaglutide (TRULICITY) 3 RW/1.1YY SOPN  PHARMACY:   [id number] [place], [place] - [place] Phone:  [phone]  Fax:  [phone]      HAS THE PATIENT CONTACTED THEIR PHARMACY?  YES  IS THIS A 90 DAY SUPPLY : YES  IS PATIENT OUT OF MEDICATION: YES  IF NOT; HOW MUCH IS LEFT:   LAST APPOINTMENT DATE: @12 /17/2022  NEXT APPOINTMENT DATE:@4 /21/2023  DO WE HAVE YOUR PERMISSION TO LEAVE A DETAILED MESSAGE?:  OTHER COMMENTS: Pharmacy is unable to fill or provide delivery date on the above medication. Patient request alternate drug. Please contact patient at [phone] and advise.   **Let patient know to contact pharmacy at the end of the day to make sure medication is ready. **  ** Please notify patient to allow 48-72 hours to process**  **Encourage patient to contact the pharmacy for refills or they can request refills through [facility]**

## 2021-05-09 ENCOUNTER — Other Ambulatory Visit: Payer: Self-pay | Admitting: Endocrinology

## 2021-05-09 MED ORDER — OZEMPIC (2 MG/DOSE) 8 MG/3ML ~~LOC~~ SOPN: 2.0000 mg | PEN_INJECTOR | SUBCUTANEOUS | 3 refills | Status: DC

## 2021-05-09 NOTE — Telephone Encounter (Signed)
Patient has now been informed of alternative

## 2021-06-17 NOTE — Progress Notes (Unsigned)
Chronic Care Management Pharmacy Note  06/17/2021 Name:  Robin Rice MRN:  [id number] DOB:  November 10, 1950  Summary: Initial visit with PharmD.  Meds reviewed and updated.  Patient reports she feels great with no concern.  She is working to limit her carbs and get back on track with her diet.  Recommendations/Changes made from today's visit: None at this time  Plan: FU 6 months   Subjective: Robin Rice is an 71 y.o. year old female who is a primary patient of Jerline Pain, Algis Greenhouse, MD.  The CCM team was consulted for assistance with disease management and care coordination needs.    Engaged with patient by telephone for initial visit in response to provider referral for pharmacy case management and/or care coordination services.   Consent to Services:  The patient was given the following information about Chronic Care Management services today, agreed to services, and gave verbal consent: 1. CCM service includes personalized support from designated clinical staff supervised by the primary care provider, including individualized plan of care and coordination with other care providers 2. 24/7 contact phone numbers for assistance for urgent and routine care needs. 3. Service will only be billed when office clinical staff spend 20 minutes or more in a month to coordinate care. 4. Only one practitioner may furnish and bill the service in a calendar month. 5.The patient may stop CCM services at any time (effective at the end of the month) by phone call to the office staff. 6. The patient will be responsible for cost sharing (co-pay) of up to 20% of the service fee (after annual deductible is met). Patient agreed to services and consent obtained.  Patient Care Team: Vivi Barrack, MD as PCP - General (Family Medicine) Salvadore Dom, MD as Consulting Physician (Obstetrics and Gynecology) Renato Shin, MD as Consulting Physician (Endocrinology) Edythe Clarity, [facility] as Pharmacist  (Pharmacist)  Recent office visits:  10/30/2020 Earle Gell, MD; chronic follow up , no medication changes indicated.   Recent consult visits:  09/06/2020 OV (endocrinology) Renato Shin, MD; increase Trulicity, follow up in 4 months   Hospital visits:  None in previous 6 months   Objective:  Lab Results  Component Value Date   CREATININE 0.92 10/30/2020   BUN 22 10/30/2020   GFR 63.95 10/30/2020   NA 139 10/30/2020   K 3.6 10/30/2020   CALCIUM 9.7 10/30/2020   CO2 30 10/30/2020   GLUCOSE 106 (H) 10/30/2020    Lab Results  Component Value Date/Time   HGBA1C 5.6 02/15/2021 08:35 AM   HGBA1C 5.5 09/06/2020 10:33 AM   HGBA1C 5.3 10/26/2019 10:36 AM   HGBA1C 5.5 05/16/2019 11:18 AM   HGBA1C 6.5 04/15/2016 12:00 AM   GFR 63.95 10/30/2020 02:54 PM   GFR 85.19 10/26/2019 10:36 AM    Last diabetic Eye exam:  Lab Results  Component Value Date/Time   HMDIABEYEEXA No Retinopathy 10/26/2017 12:00 AM    Last diabetic Foot exam: No results found for: HMDIABFOOTEX   Lab Results  Component Value Date   CHOL 165 10/30/2020   HDL 80.20 10/30/2020   LDLCALC 63 10/30/2020   TRIG 109.0 10/30/2020   CHOLHDL 2 10/30/2020    Hepatic Function Latest Ref Rng & Units 10/30/2020 10/26/2019 05/16/2019  Total Protein 6.0 - 8.3 g/dL 7.4 7.5 7.5  Albumin 3.5 - 5.2 g/dL 4.3 4.6 4.4  AST 0 - 37 U/L 21 22 21   ALT 0 - 35 U/L 17 17 19   Alk  Phosphatase 39 - 117 U/L 88 80 87  Total Bilirubin 0.2 - 1.2 mg/dL 0.5 0.8 0.5    Lab Results  Component Value Date/Time   TSH 0.54 10/30/2020 02:54 PM   TSH 1.15 10/26/2019 10:36 AM   FREET4 1.06 10/26/2019 10:36 AM   FREET4 1.17 05/04/2018 10:36 AM    CBC Latest Ref Rng & Units 10/30/2020 10/26/2019 05/16/2019  WBC 4.0 - 10.5 K/uL 9.8 9.3 8.8  Hemoglobin 12.0 - 15.0 g/dL 12.9 13.5 12.8  Hematocrit 36.0 - 46.0 % 37.9 40.7 38.7  Platelets 150.0 - 400.0 K/uL 169.0 167.0 190.0    Lab Results  Component Value Date/Time   VD25OH 76.10  10/25/2018 10:06 AM   VD25OH 18.97 (L) 10/30/2017 09:21 AM    Clinical ASCVD: No  The 10-year ASCVD risk score (Arnett DK, et al., 2019) is: 29.3%   Values used to calculate the score:     Age: 21 years     Sex: Female     Is Non-Hispanic African American: Yes     Diabetic: Yes     Tobacco smoker: No     Systolic Blood Pressure: 354 mmHg     Is BP treated: Yes     HDL Cholesterol: 80.2 mg/dL     Total Cholesterol: 165 mg/dL    Depression screen [facility] 2/9 10/30/2020 10/26/2020 10/26/2019  Decreased Interest 0 0 0  Down, Depressed, Hopeless 0 0 0  PHQ - 2 Score 0 0 0  Altered sleeping - - -  Tired, decreased energy - - -  Change in appetite - - -  Feeling bad or failure about yourself  - - -  Trouble concentrating - - -  Moving slowly or fidgety/restless - - -  Suicidal thoughts - - -  PHQ-9 Score - - -  Difficult doing work/chores - - -     Social History   Tobacco Use  Smoking Status Never  Smokeless Tobacco Never   BP Readings from Last 3 Encounters:  02/15/21 (!) 152/60  10/30/20 134/71  10/26/20 128/64   Pulse Readings from Last 3 Encounters:  02/15/21 (!) 107  10/30/20 86  10/26/20 (!) 103   Wt Readings from Last 3 Encounters:  02/15/21 167 lb 3.2 oz (75.8 kg)  10/30/20 168 lb (76.2 kg)  10/26/20 167 lb 9.6 oz (76 kg)   BMI Readings from Last 3 Encounters:  02/15/21 28.70 kg/m  10/30/20 28.84 kg/m  10/26/20 28.77 kg/m    Assessment/Interventions: Review of patient past medical history, allergies, medications, health status, including review of consultants reports, laboratory and other test data, was performed as part of comprehensive evaluation and provision of chronic care management services.   SDOH:  (Social Determinants of Health) assessments and interventions performed: Yes  Financial Resource Strain: Low Risk    Difficulty of Paying Living Expenses: Not hard at all    SDOH Screenings   Alcohol Screen: Not on file  Depression (PHQ2-9): Low  Risk    PHQ-2 Score: 0  Financial Resource Strain: Low Risk    Difficulty of Paying Living Expenses: Not hard at all  Food Insecurity: No Food Insecurity   Worried About Charity fundraiser in the Last Year: Never true   Ran Out of Food in the Last Year: Never true  Housing: Low Risk    Last Housing Risk Score: 0  Physical Activity: Sufficiently Active   Days of Exercise per Week: 4 days   Minutes of Exercise per Session: 90  min  Social Connections: Moderately Integrated   Frequency of Communication with Friends and Family: More than three times a week   Frequency of Social Gatherings with Friends and Family: Three times a week   Attends Religious Services: More than 4 times per year   Active Member of Clubs or Organizations: No   Attends Archivist Meetings: Never   Marital Status: Married  Stress: No Stress Concern Present   Feeling of Stress : Not at all  Tobacco Use: Low Risk    Smoking Tobacco Use: Never   Smokeless Tobacco Use: Never   Passive Exposure: Not on file  Transportation Needs: No Transportation Needs   Lack of Transportation (Medical): No   Lack of Transportation (Non-Medical): No    CCM Care Plan  Allergies  Allergen Reactions   Other     Medications Reviewed Today     Reviewed by Casandra Doffing, CMA (Certified Medical Assistant) on 02/15/21 at [place] List Status: <None>   Medication Order Taking? Sig Documenting Provider Last Dose Status Informant  amLODipine (NORVASC) 10 MG tablet [id number] Yes TAKE 1 TABLET BY MOUTH EVERY DAY Vivi Barrack, MD Taking Active   aspirin 81 MG tablet [id number] Yes Take 81 mg by mouth daily. [provider] Taking Active   atorvastatin (LIPITOR) 20 MG tablet [id number] Yes TAKE 1 TABLET BY MOUTH EVERY DAY Orma Flaming, MD Taking Active   Cholecalciferol (VITAMIN D3) 125 MCG (5000 UT) TABS [id number] Yes Take by mouth. [provider] Taking Active            Med Note Marcelline Deist,  MELITTA D   Wed Oct 26, 2019  9:48 AM) Every other day  Dulaglutide (TRULICITY) 1.5 XI/7.1SJ SOPN [id number] Yes Inject 1.5 mg into the skin once a week. Renato Shin, MD Taking Active   glucose blood [facility] VERIO) test strip [id number] Yes 1 each by Other route once a week. And lancets 1/day Renato Shin, MD Taking Active   Lancets [facility] ULTRASOFT) lancets [id number] Yes Used to check blood sugars daily. DX CODE E11.9. Renato Shin, MD Taking Active   levothyroxine (SYNTHROID) 50 MCG tablet [id number] Yes TAKE 1 TABLET BY MOUTH EVERY DAY Orma Flaming, MD Taking Active   lisinopril-hydrochlorothiazide (ZESTORETIC) 20-25 MG tablet [id number] Yes TAKE 1 TABLET BY MOUTH EVERY DAY Vivi Barrack, MD Taking Active             Patient Active Problem List   Diagnosis Date Noted   Multinodular goiter 03/06/2020   Hypothyroid    Osteoarthritis, knee 09/20/2015   Controlled type 2 diabetes mellitus without complication, without long-term current use of insulin ([place]) 09/20/2015   Cardiac murmur 09/20/2015   Benign essential HTN    Hyperlipidemia associated with type 2 diabetes mellitus ([place])    Glaucoma, left eye     Immunization History  Administered Date(s) Administered   Fluad Quad(high Dose 65+) 01/06/2019, 01/16/2021   Influenza, High Dose Seasonal PF 02/01/2018   Influenza,inj,Quad PF,6+ Mos 01/06/2017   Influenza-Unspecified 02/15/2015, 01/07/2016, 01/16/2020   PFIZER(Purple Top)SARS-COV-2 Vaccination 06/12/2019, 07/07/2019, 08/27/2020, 01/16/2021   Pneumococcal Conjugate-13 02/15/2015   Pneumococcal Polysaccharide-23 09/15/2017   Tdap 05/18/2007, 02/01/2018   Zoster Recombinat (Shingrix) 10/31/2017, 02/08/2018   Zoster, Live 10/07/2012    Conditions to be addressed/monitored:  HTN, HLD, Type II DM, Hypothyroidism  There are no care plans that you recently modified to display for this patient.     Medication Assistance: None required.  Patient affirms current  coverage meets needs.  Compliance/Adherence/Medication fill history: Care Gaps: Eye exam - diabetes  Star-Rating Drugs: Amlodipine 10 mg last filled 12/05/2020 90 DS Atorvastatin 20 mg last filled 98/33/8250 90 DS Trulicity 1.5 NL/9.7 ml last filled 12/03/2020 84 DS Lisinopril-HCTZ 20-25 mg last filled 12/05/2020 90 DS  Patient's preferred pharmacy is:  [id number] [place] [place]Phone: [phone]: [phone]  Uses pill box? Yes Pt endorses 100% compliance  We discussed: Benefits of medication synchronization, packaging and delivery as well as enhanced pharmacist oversight with [facility]. Patient decided to: Utilize [facility] pharmacy for medication synchronization, packaging and delivery  Care Plan and Follow Up Patient Decision:  Patient agrees to Care Plan and Follow-up.  Plan: The care management team will reach out to the patient again over the next 180 days.   Current Barriers:  None at this time  Pharmacist Clinical Goal(s):  Patient will maintain control of BP and glucose as evidenced by labs  through collaboration with PharmD and provider.   Interventions: 1:1 collaboration with PVivi Barrack MD regarding development and update of comprehensive plan of care as evidenced by provider attestation and co-signature Inter-disciplinary care team collaboration (see longitudinal plan of care) Comprehensive medication review performed; medication list updated in electronic medical record  Hypertension (BP goal <130/80) -Controlled -Current treatment: Amlodipine 1392mdaily Lisinopril-HCTZ 20-2531maily -Medications previously tried: none noted  -Current home readings: not checking at home -Current dietary habits: she has put on some weight lately but plans to get back on track with her diet -Current exercise habits: walks 5 miles 3-4 times per week -Denies hypotensive/hypertensive  symptoms -Educated on BP goals and benefits of medications for prevention of heart attack, stroke and kidney damage; Daily salt intake goal < 2300 mg; Exercise goal of 150 minutes per week; Symptoms of hypotension and importance of maintaining adequate hydration; -Counseled to monitor BP at home periodically, document, and provide log at future appointments -Recommend monitor BP, if any low BP at all contact providers.  Hyperlipidemia: (LDL goal < 70) -Controlled -Current treatment: Atorvastatin 70m60mily -Medications previously tried: none noted  -Most recent lipid panel is excellent -Educated on Cholesterol goals;  Benefits of statin for ASCVD risk reduction; Importance of limiting foods high in cholesterol; -Recommended to continue current medication  Diabetes (A1c goal <7%) -Controlled -Current medications: Trulicity 1.92m9.6QIe weekly -Medications previously tried: metformin, Januvia  -Current home glucose readings fasting glucose: not checking post prandial glucose: not checking -Denies hypoglycemic/hyperglycemic symptoms -Current meal patterns: patient eating larger portions lately, high carb content in foods -Current exercise: walking 5 miles 3-4 times per week -Educated on A1c and blood sugar goals; Prevention and management of hypoglycemic episodes; Carbohydrate counting and/or plate method One carb source per meal -Counseled to check feet daily and get yearly eye exams -Recommended to continue current medication She plans on eating better and losing weight, counseled to keep a good eye on glucose to avoid hypoglycemia.  Hypothyroidism (Goal: Maintain TSH) -Controlled -Current treatment  Levothyroxine 50mc70mily -Medications previously tried: none noted -TSH is WNL  -Recommended to continue current medication Counseled on proper medication administration 30-60 minutes before food on empty stomach if possible.  Patient Goals/Self-Care Activities Patient will:   - take medications as prescribed focus on medication adherence by pill box target a minimum of 150 minutes of moderate intensity exercise weekly  Follow Up Plan: The care management team will reach out to the patient again  over the next 80 days.

## 2021-07-01 ENCOUNTER — Telehealth: Payer: Medicare HMO

## 2021-07-01 ENCOUNTER — Ambulatory Visit (INDEPENDENT_AMBULATORY_CARE_PROVIDER_SITE_OTHER): Payer: Medicare HMO | Admitting: Pharmacist

## 2021-07-01 DIAGNOSIS — I10 Essential (primary) hypertension: Secondary | ICD-10-CM

## 2021-07-01 DIAGNOSIS — E119 Type 2 diabetes mellitus without complications: Secondary | ICD-10-CM

## 2021-07-01 NOTE — Patient Instructions (Addendum)
Visit Information   Goals Addressed             This Visit's Progress    Track and Manage My Blood Pressure-Hypertension   On track    Timeframe:  Long-Range Goal Priority:  High Start Date:    12/24/20                         Expected End Date:   06/26/21                    Follow Up Date 04/03/21    - check blood pressure weekly - choose a place to take my blood pressure (home, clinic or office, retail store) - write blood pressure results in a log or diary    Why is this important?   You won't feel high blood pressure, but it can still hurt your blood vessels.  High blood pressure can cause heart or kidney problems. It can also cause a stroke.  Making lifestyle changes like losing a little weight or eating less salt will help.  Checking your blood pressure at home and at different times of the day can help to control blood pressure.  If the doctor prescribes medicine remember to take it the way the doctor ordered.  Call the office if you cannot afford the medicine or if there are questions about it.     Notes:        Patient Care Plan: General Pharmacy (Adult)     Problem Identified: HTN, HLD, Type II DM, Hypothyroidism   Priority: High  Onset Date: 12/24/2020     Long-Range Goal: Patient-Specific Goal   Start Date: 12/24/2020  Expected End Date: 06/26/2021  Recent Progress: On track  Priority: High  Note:   Current Barriers:  GLP-1 Supply concern  Pharmacist Clinical Goal(s):  Patient will maintain control of BP and glucose as evidenced by labs  through collaboration with PharmD and provider.   Interventions: 1:1 collaboration with Vivi Barrack, MD regarding development and update of comprehensive plan of care as evidenced by provider attestation and co-signature Inter-disciplinary care team collaboration (see longitudinal plan of care) Comprehensive medication review performed; medication list updated in electronic medical record  Hypertension (BP goal  <130/80) -Controlled -Current treatment: Amlodipine 10mg  daily Appropriate, Effective, Safe, Accessible Lisinopril-HCTZ 20-25mg  daily Appropriate, Effective, Safe, Accessible -Medications previously tried: none noted  -Current home readings: not checking at home -Current dietary habits: she has put on some weight lately but plans to get back on track with her diet -Current exercise habits: walks 5 miles 3-4 times per week -Denies hypotensive/hypertensive symptoms -Educated on BP goals and benefits of medications for prevention of heart attack, stroke and kidney damage; Daily salt intake goal < 2300 mg; Exercise goal of 150 minutes per week; Symptoms of hypotension and importance of maintaining adequate hydration; -Counseled to monitor BP at home periodically, document, and provide log at future appointments -Recommend monitor BP, if any low BP at all contact providers.  Update 07/01/21 Patient reports BP well controlled. She has not been checking at home but denies any dizziness or HA. Continues to be adherent with medications. Encouraged to remain active and continue positive lifestyle changes with diet. Continue to watch salt intake. No changes to meds.  Hyperlipidemia: (LDL goal < 70) -Controlled -Current treatment: Atorvastatin 20mg  daily -Medications previously tried: none noted  -Most recent lipid panel is excellent -Educated on Cholesterol goals;  Benefits of statin for  ASCVD risk reduction; Importance of limiting foods high in cholesterol; -Recommended to continue current medication  Diabetes (A1c goal <7%) -Controlled -Current medications: Ozempic 2mg  once weekly Appropriate, Effective, Safe, Accessible -Medications previously tried: metformin, Januvia  -Current home glucose readings fasting glucose: not checking post prandial glucose: not checking -Denies hypoglycemic/hyperglycemic symptoms -Current meal patterns: patient eating larger portions lately, high carb  content in foods -Current exercise: walking 5 miles 3-4 times per week -Educated on A1c and blood sugar goals; Prevention and management of hypoglycemic episodes; Carbohydrate counting and/or plate method One carb source per meal -Counseled to check feet daily and get yearly eye exams -Recommended to continue current medication She plans on eating better and losing weight, counseled to keep a good eye on glucose to avoid hypoglycemia.  Update 07/01/21 She recently switched from Trulicity to [place] due to supply issues at her pharmacy. She is tolerating Ozempic fine with no changes to copay.  Stated that [place] supply was ok at the moment as I was under the impression there was a shortage on it as well.  No glucose logs to review today, most recent A1c well controlled.  Remains active and working on lifestyle changes. Most recent A1c in [place] 5.9. Due for recheck by April 2023. Patient is also due for diabetic eye exam.  Hypothyroidism (Goal: Maintain TSH) -Controlled -Current treatment  Levothyroxine 33mcg daily -Medications previously tried: none noted -TSH is WNL  -Recommended to continue current medication Counseled on proper medication administration 30-60 minutes before food on empty stomach if possible.  Patient Goals/Self-Care Activities Patient will:  - take medications as prescribed focus on medication adherence by pill box target a minimum of 150 minutes of moderate intensity exercise weekly  Follow Up Plan: The care management team will reach out to the patient again over the next 80 days.           Patient verbalizes understanding of instructions and care plan provided today and agrees to view in [place]. Active MyChart status confirmed with patient.   Telephone follow up appointment with pharmacy team member scheduled for: 6 months  Edythe Clarity, [place], PharmD Clinical Pharmacist  [facility] [phone]

## 2021-07-01 NOTE — Progress Notes (Signed)
Chronic Care Management Pharmacy Note  07/01/2021 Name:  Robin Rice MRN:  [id number] DOB:  Jun 27, 1950  Summary: Pharmd follow up patient doing well.  Switched to new GLP-1 due to supply concerns.   Subjective: Robin Rice is an 71 y.o. year old female who is a primary patient of Jerline Pain, Algis Greenhouse, MD.  The CCM team was consulted for assistance with disease management and care coordination needs.    Engaged with patient by telephone for initial visit in response to provider referral for pharmacy case management and/or care coordination services.   Consent to Services:  The patient was given the following information about Chronic Care Management services today, agreed to services, and gave verbal consent: 1. CCM service includes personalized support from designated clinical staff supervised by the primary care provider, including individualized plan of care and coordination with other care providers 2. 24/7 contact phone numbers for assistance for urgent and routine care needs. 3. Service will only be billed when office clinical staff spend 20 minutes or more in a month to coordinate care. 4. Only one practitioner may furnish and bill the service in a calendar month. 5.The patient may stop CCM services at any time (effective at the end of the month) by phone call to the office staff. 6. The patient will be responsible for cost sharing (co-pay) of up to 20% of the service fee (after annual deductible is met). Patient agreed to services and consent obtained.  Patient Care Team: Vivi Barrack, MD as PCP - General (Family Medicine) Salvadore Dom, MD as Consulting Physician (Obstetrics and Gynecology) Renato Shin, MD as Consulting Physician (Endocrinology) Edythe Clarity, [facility] as Pharmacist (Pharmacist)  Recent office visits:  10/30/2020 Earle Gell, MD; chronic follow up , no medication changes indicated.   Recent consult visits:  09/06/2020 OV (endocrinology)  Renato Shin, MD; increase Trulicity, follow up in 4 months   Hospital visits:  None in previous 6 months   Objective:  Lab Results  Component Value Date   CREATININE 0.92 10/30/2020   BUN 22 10/30/2020   GFR 63.95 10/30/2020   NA 139 10/30/2020   K 3.6 10/30/2020   CALCIUM 9.7 10/30/2020   CO2 30 10/30/2020   GLUCOSE 106 (H) 10/30/2020    Lab Results  Component Value Date/Time   HGBA1C 5.6 02/15/2021 08:35 AM   HGBA1C 5.5 09/06/2020 10:33 AM   HGBA1C 5.3 10/26/2019 10:36 AM   HGBA1C 5.5 05/16/2019 11:18 AM   HGBA1C 6.5 04/15/2016 12:00 AM   GFR 63.95 10/30/2020 02:54 PM   GFR 85.19 10/26/2019 10:36 AM    Last diabetic Eye exam:  Lab Results  Component Value Date/Time   HMDIABEYEEXA No Retinopathy 10/26/2017 12:00 AM    Last diabetic Foot exam: No results found for: HMDIABFOOTEX   Lab Results  Component Value Date   CHOL 165 10/30/2020   HDL 80.20 10/30/2020   LDLCALC 63 10/30/2020   TRIG 109.0 10/30/2020   CHOLHDL 2 10/30/2020    Hepatic Function Latest Ref Rng & Units 10/30/2020 10/26/2019 05/16/2019  Total Protein 6.0 - 8.3 g/dL 7.4 7.5 7.5  Albumin 3.5 - 5.2 g/dL 4.3 4.6 4.4  AST 0 - 37 U/L 21 22 21   ALT 0 - 35 U/L 17 17 19   Alk Phosphatase 39 - 117 U/L 88 80 87  Total Bilirubin 0.2 - 1.2 mg/dL 0.5 0.8 0.5    Lab Results  Component Value Date/Time   TSH 0.54 10/30/2020 02:54 PM  TSH 1.15 10/26/2019 10:36 AM   FREET4 1.06 10/26/2019 10:36 AM   FREET4 1.17 05/04/2018 10:36 AM    CBC Latest Ref Rng & Units 10/30/2020 10/26/2019 05/16/2019  WBC 4.0 - 10.5 K/uL 9.8 9.3 8.8  Hemoglobin 12.0 - 15.0 g/dL 12.9 13.5 12.8  Hematocrit 36.0 - 46.0 % 37.9 40.7 38.7  Platelets 150.0 - 400.0 K/uL 169.0 167.0 190.0    Lab Results  Component Value Date/Time   VD25OH 76.10 10/25/2018 10:06 AM   VD25OH 18.97 (L) 10/30/2017 09:21 AM    Clinical ASCVD: No  The 10-year ASCVD risk score (Arnett DK, et al., 2019) is: 29.3%   Values used to calculate the score:      Age: 7 years     Sex: Female     Is Non-Hispanic African American: Yes     Diabetic: Yes     Tobacco smoker: No     Systolic Blood Pressure: 161 mmHg     Is BP treated: Yes     HDL Cholesterol: 80.2 mg/dL     Total Cholesterol: 165 mg/dL    Depression screen [place] 2/9 10/30/2020 10/26/2020 10/26/2019  Decreased Interest 0 0 0  Down, Depressed, Hopeless 0 0 0  PHQ - 2 Score 0 0 0  Altered sleeping - - -  Tired, decreased energy - - -  Change in appetite - - -  Feeling bad or failure about yourself  - - -  Trouble concentrating - - -  Moving slowly or fidgety/restless - - -  Suicidal thoughts - - -  PHQ-9 Score - - -  Difficult doing work/chores - - -     Social History   Tobacco Use  Smoking Status Never  Smokeless Tobacco Never   BP Readings from Last 3 Encounters:  02/15/21 (!) 152/60  10/30/20 134/71  10/26/20 128/64   Pulse Readings from Last 3 Encounters:  02/15/21 (!) 107  10/30/20 86  10/26/20 (!) 103   Wt Readings from Last 3 Encounters:  02/15/21 167 lb 3.2 oz (75.8 kg)  10/30/20 168 lb (76.2 kg)  10/26/20 167 lb 9.6 oz (76 kg)   BMI Readings from Last 3 Encounters:  02/15/21 28.70 kg/m  10/30/20 28.84 kg/m  10/26/20 28.77 kg/m    Assessment/Interventions: Review of patient past medical history, allergies, medications, health status, including review of consultants reports, laboratory and other test data, was performed as part of comprehensive evaluation and provision of chronic care management services.   SDOH:  (Social Determinants of Health) assessments and interventions performed: Yes  Financial Resource Strain: Low Risk    Difficulty of Paying Living Expenses: Not hard at all    SDOH Screenings   Alcohol Screen: Not on file  Depression (PHQ2-9): Low Risk    PHQ-2 Score: 0  Financial Resource Strain: Low Risk    Difficulty of Paying Living Expenses: Not hard at all  Food Insecurity: No Food Insecurity   Worried About Charity fundraiser  in the Last Year: Never true   Ran Out of Food in the Last Year: Never true  Housing: Low Risk    Last Housing Risk Score: 0  Physical Activity: Sufficiently Active   Days of Exercise per Week: 4 days   Minutes of Exercise per Session: 90 min  Social Connections: Moderately Integrated   Frequency of Communication with Friends and Family: More than three times a week   Frequency of Social Gatherings with Friends and Family: Three times a week  Attends Religious Services: More than 4 times per year   Active Member of Clubs or Organizations: No   Attends Archivist Meetings: Never   Marital Status: Married  Stress: No Stress Concern Present   Feeling of Stress : Not at all  Tobacco Use: Low Risk    Smoking Tobacco Use: Never   Smokeless Tobacco Use: Never   Passive Exposure: Not on file  Transportation Needs: No Transportation Needs   Lack of Transportation (Medical): No   Lack of Transportation (Non-Medical): No    CCM Care Plan  Allergies  Allergen Reactions   Other     Medications Reviewed Today     Reviewed by Edythe Clarity, [facility] (Pharmacist) on 07/01/21 at 1311  Med List Status: <None>   Medication Order Taking? Sig Documenting Provider Last Dose Status Informant  amLODipine (NORVASC) 10 MG tablet [id number] Yes TAKE 1 TABLET BY MOUTH EVERY DAY Vivi Barrack, MD Taking Active   aspirin 81 MG tablet [id number] Yes Take 81 mg by mouth daily. [provider] Taking Active   atorvastatin (LIPITOR) 20 MG tablet [id number] Yes TAKE 1 TABLET BY MOUTH EVERY DAY Vivi Barrack, MD Taking Active   Cholecalciferol (VITAMIN D3) 125 MCG (5000 UT) TABS [id number] Yes Take by mouth. [provider] Taking Active            Med Note Marcelline Deist, MELITTA D   Wed Oct 26, 2019  9:48 AM) Every other day  glucose blood (ONETOUCH VERIO) test strip [id number] Yes 1 each by Other route once a week. And lancets 1/day Renato Shin, MD Taking Active   Lancets [facility]  ULTRASOFT) lancets [id number] Yes Used to check blood sugars daily. DX CODE E11.9. Renato Shin, MD Taking Active   levothyroxine (SYNTHROID) 50 MCG tablet [id number] Yes TAKE 1 TABLET BY MOUTH EVERY DAY Vivi Barrack, MD Taking Active   lisinopril-hydrochlorothiazide (ZESTORETIC) 20-25 MG tablet [id number] Yes TAKE 1 TABLET BY MOUTH EVERY DAY Vivi Barrack, MD Taking Active   Semaglutide, 2 MG/DOSE, (OZEMPIC, 2 MG/DOSE,) 8 MG/3ML SOPN [id number] Yes Inject 2 mg into the skin once a week. Renato Shin, MD Taking Active             Patient Active Problem List   Diagnosis Date Noted   Multinodular goiter 03/06/2020   Hypothyroid    Osteoarthritis, knee 09/20/2015   Controlled type 2 diabetes mellitus without complication, without long-term current use of insulin ([place]) 09/20/2015   Cardiac murmur 09/20/2015   Benign essential HTN    Hyperlipidemia associated with type 2 diabetes mellitus ([place])    Glaucoma, left eye     Immunization History  Administered Date(s) Administered   Fluad Quad(high Dose 65+) 01/06/2019, 01/16/2021   Influenza, High Dose Seasonal PF 02/01/2018   Influenza,inj,Quad PF,6+ Mos 01/06/2017   Influenza-Unspecified 02/15/2015, 01/07/2016, 01/16/2020   PFIZER(Purple Top)SARS-COV-2 Vaccination 06/12/2019, 07/07/2019, 08/27/2020, 01/16/2021   Pneumococcal Conjugate-13 02/15/2015   Pneumococcal Polysaccharide-23 09/15/2017   Tdap 05/18/2007, 02/01/2018   Zoster Recombinat (Shingrix) 10/31/2017, 02/08/2018   Zoster, Live 10/07/2012    Conditions to be addressed/monitored:  HTN, HLD, Type II DM, Hypothyroidism  Care Plan : General Pharmacy (Adult)  Updates made by Edythe Clarity, RPH since 07/01/2021 12:00 AM     Problem: HTN, HLD, Type II DM, Hypothyroidism   Priority: High  Onset Date: 12/24/2020     Long-Range Goal: Patient-Specific Goal   Start Date: 12/24/2020  Expected End Date: 06/26/2021  Recent Progress: On  track  Priority: High  Note:    Current Barriers:  GLP-1 Supply concern  Pharmacist Clinical Goal(s):  Patient will maintain control of BP and glucose as evidenced by labs  through collaboration with PharmD and provider.   Interventions: 1:1 collaboration with Vivi Barrack, MD regarding development and update of comprehensive plan of care as evidenced by provider attestation and co-signature Inter-disciplinary care team collaboration (see longitudinal plan of care) Comprehensive medication review performed; medication list updated in electronic medical record  Hypertension (BP goal <130/80) -Controlled -Current treatment: Amlodipine 10mg  daily Appropriate, Effective, Safe, Accessible Lisinopril-HCTZ 20-25mg  daily Appropriate, Effective, Safe, Accessible -Medications previously tried: none noted  -Current home readings: not checking at home -Current dietary habits: she has put on some weight lately but plans to get back on track with her diet -Current exercise habits: walks 5 miles 3-4 times per week -Denies hypotensive/hypertensive symptoms -Educated on BP goals and benefits of medications for prevention of heart attack, stroke and kidney damage; Daily salt intake goal < 2300 mg; Exercise goal of 150 minutes per week; Symptoms of hypotension and importance of maintaining adequate hydration; -Counseled to monitor BP at home periodically, document, and provide log at future appointments -Recommend monitor BP, if any low BP at all contact providers.  Update 07/01/21 Patient reports BP well controlled. She has not been checking at home but denies any dizziness or HA. Continues to be adherent with medications. Encouraged to remain active and continue positive lifestyle changes with diet. Continue to watch salt intake. No changes to meds.  Hyperlipidemia: (LDL goal < 70) -Controlled -Current treatment: Atorvastatin 20mg  daily -Medications previously tried: none noted  -Most recent lipid panel is  excellent -Educated on Cholesterol goals;  Benefits of statin for ASCVD risk reduction; Importance of limiting foods high in cholesterol; -Recommended to continue current medication  Diabetes (A1c goal <7%) -Controlled -Current medications: Ozempic 2mg  once weekly Appropriate, Effective, Safe, Accessible -Medications previously tried: metformin, Januvia  -Current home glucose readings fasting glucose: not checking post prandial glucose: not checking -Denies hypoglycemic/hyperglycemic symptoms -Current meal patterns: patient eating larger portions lately, high carb content in foods -Current exercise: walking 5 miles 3-4 times per week -Educated on A1c and blood sugar goals; Prevention and management of hypoglycemic episodes; Carbohydrate counting and/or plate method One carb source per meal -Counseled to check feet daily and get yearly eye exams -Recommended to continue current medication She plans on eating better and losing weight, counseled to keep a good eye on glucose to avoid hypoglycemia.  Update 07/01/21 She recently switched from Trulicity to [place] due to supply issues at her pharmacy. She is tolerating Ozempic fine with no changes to copay.  Stated that [place] supply was ok at the moment as I was under the impression there was a shortage on it as well.  No glucose logs to review today, most recent A1c well controlled.  Remains active and working on lifestyle changes. Most recent A1c in [place] 5.9. Due for recheck by April 2023. Patient is also due for diabetic eye exam.  Hypothyroidism (Goal: Maintain TSH) -Controlled -Current treatment  Levothyroxine 29mcg daily -Medications previously tried: none noted -TSH is WNL  -Recommended to continue current medication Counseled on proper medication administration 30-60 minutes before food on empty stomach if possible.  Patient Goals/Self-Care Activities Patient will:  - take medications as prescribed focus on  medication adherence by pill box target a minimum of 150 minutes of moderate intensity exercise weekly  Follow Up Plan: The care management  team will reach out to the patient again over the next 80 days.            Medication Assistance: None required.  Patient affirms current coverage meets needs.  Compliance/Adherence/Medication fill history: Care Gaps: Eye exam - diabetes  Star-Rating Drugs: Amlodipine 10 mg last filled 12/05/2020 90 DS Atorvastatin 20 mg last filled 94/94/4739 90 DS Trulicity 1.5 PK/4.4 ml last filled 12/03/2020 84 DS Lisinopril-HCTZ 20-25 mg last filled 12/05/2020 90 DS  Patient's preferred pharmacy is:  [id number] [place], [id number]: [phone]: [phone]  Uses pill box? Yes Pt endorses 100% compliance  We discussed: Benefits of medication synchronization, packaging and delivery as well as enhanced pharmacist oversight with [facility]. Patient decided to: Utilize [facility] pharmacy for medication synchronization, packaging and delivery  Care Plan and Follow Up Patient Decision:  Patient agrees to Care Plan and Follow-up.  Plan: The care management team will reach out to the patient again over the next 180 days.  .Marland Kitchen

## 2021-07-02 DIAGNOSIS — E119 Type 2 diabetes mellitus without complications: Secondary | ICD-10-CM

## 2021-07-02 DIAGNOSIS — I10 Essential (primary) hypertension: Secondary | ICD-10-CM

## 2021-08-13 IMAGING — US US THYROID
1 series · 13 of 25 positions shown · non-contrast
Comparison: 10/24/2016

CLINICAL DATA: Prior ultrasound follow-up. History of multinodular
goiter with prior fine-needle aspiration a right thyroid nodule in
2662.

EXAM:
THYROID ULTRASOUND
TECHNIQUE: Ultrasound examination of the thyroid gland and adjacent soft
tissues was performed.

[Series 1: us thyroid · 0.07mm/px · 13 of 43 slices shown]
[im 1/43]
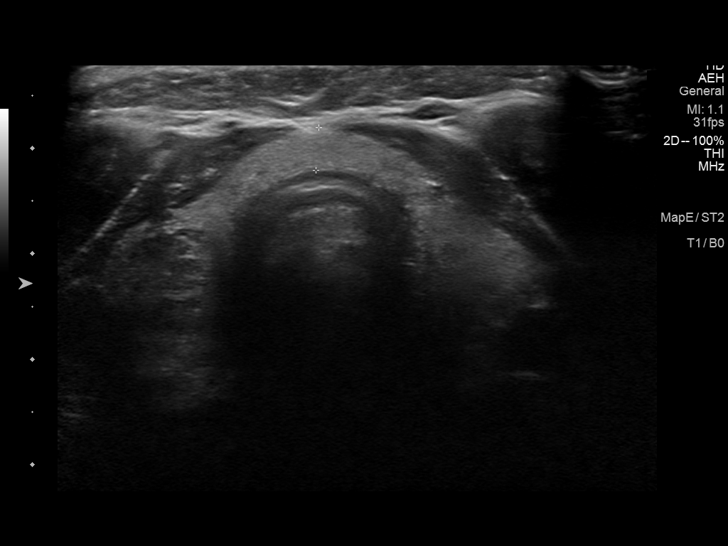
[im 4/43]
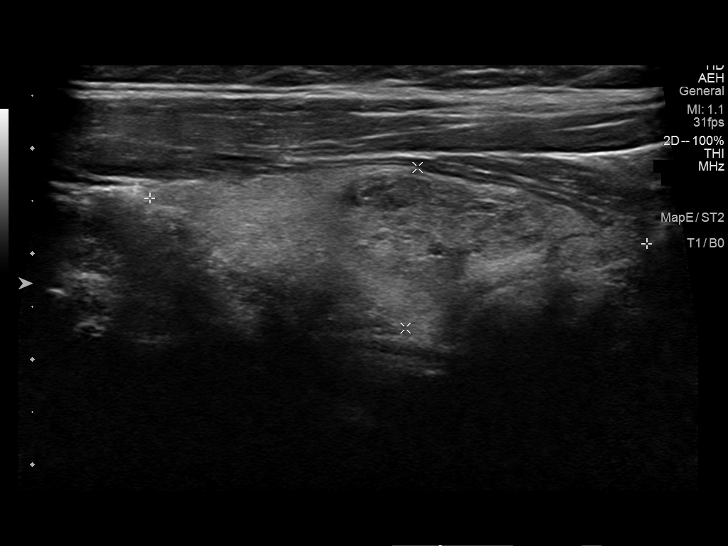
[im 8/43]
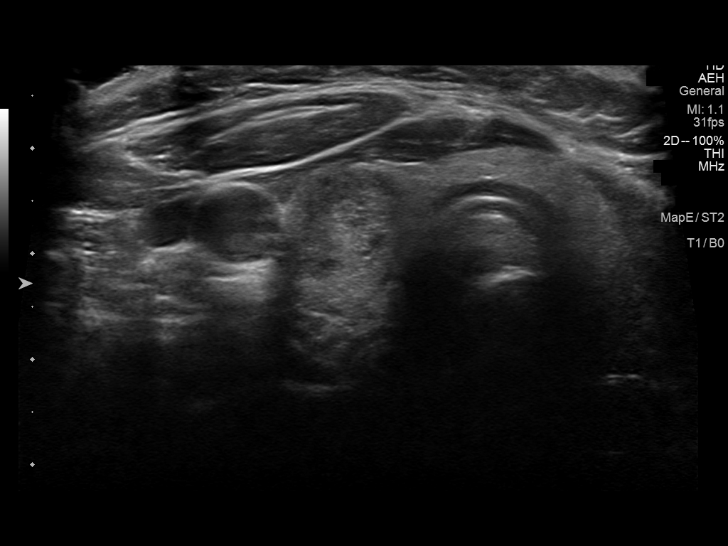
[im 11/43]
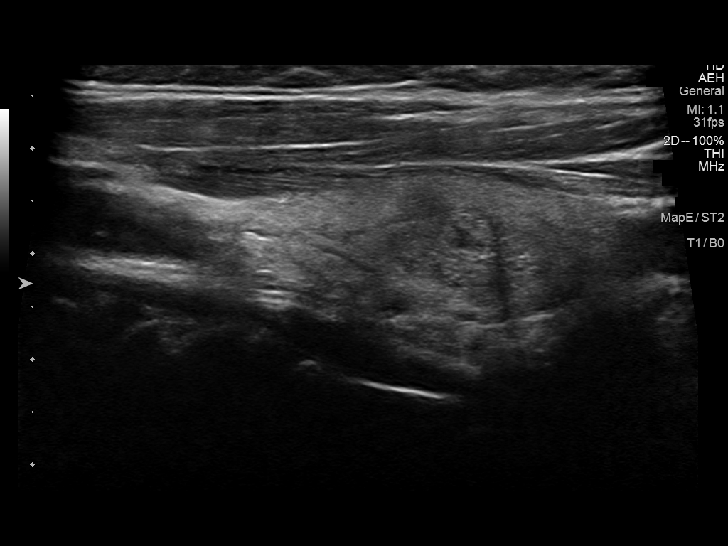
[im 15/43]
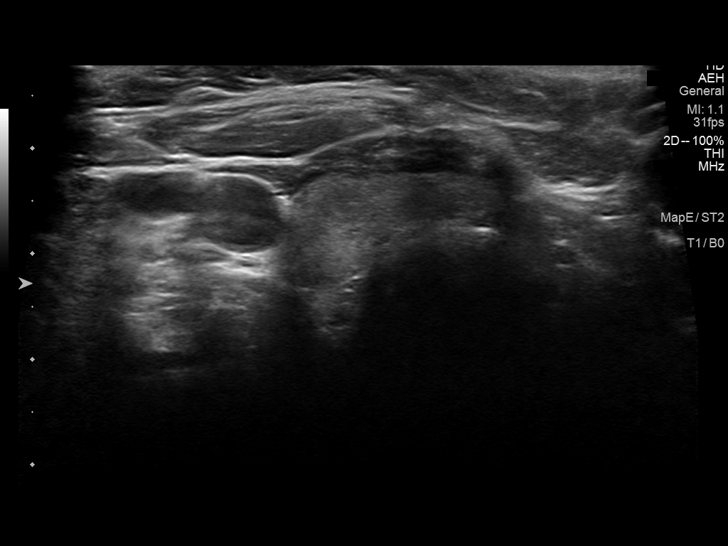
[im 18/43]
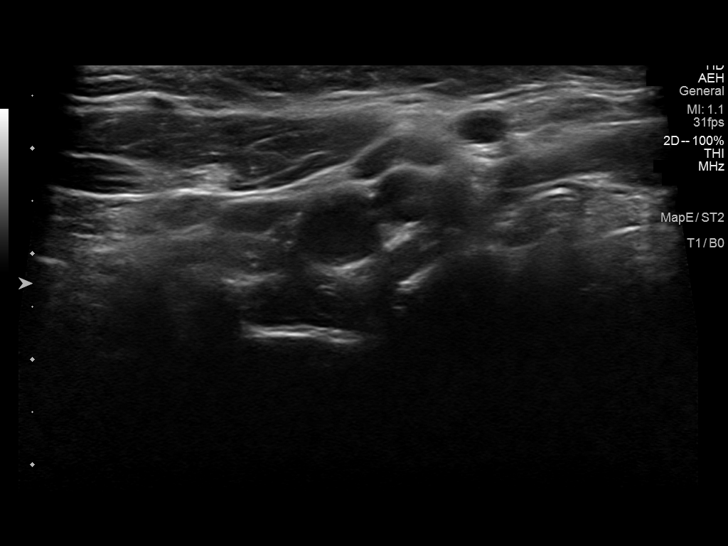
[im 22/43]
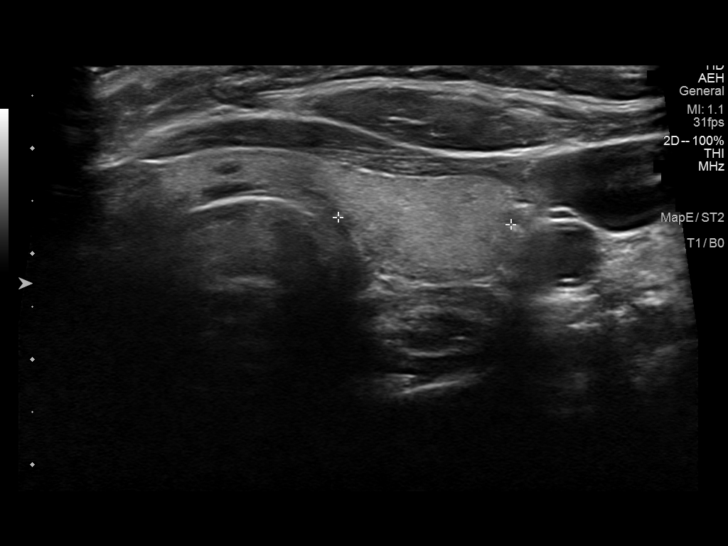
[im 25/43]
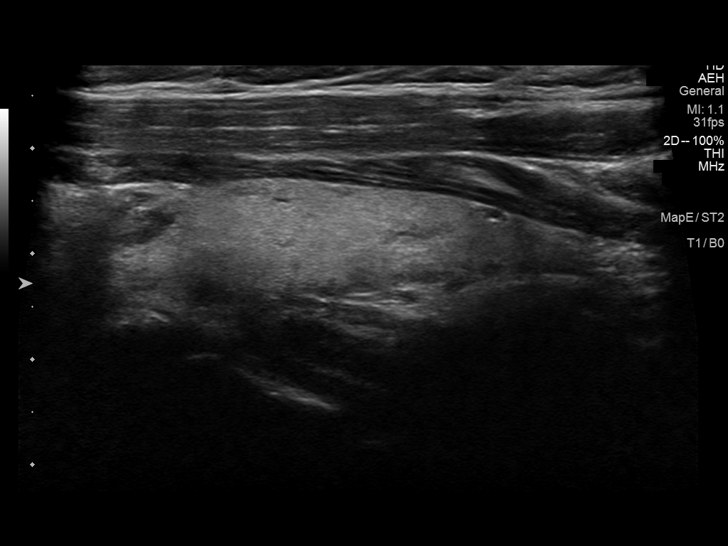
[im 29/43]
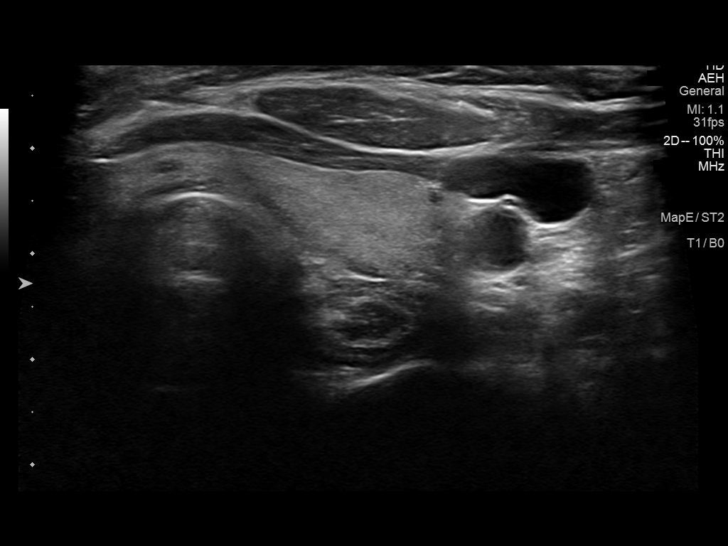
[im 32/43]
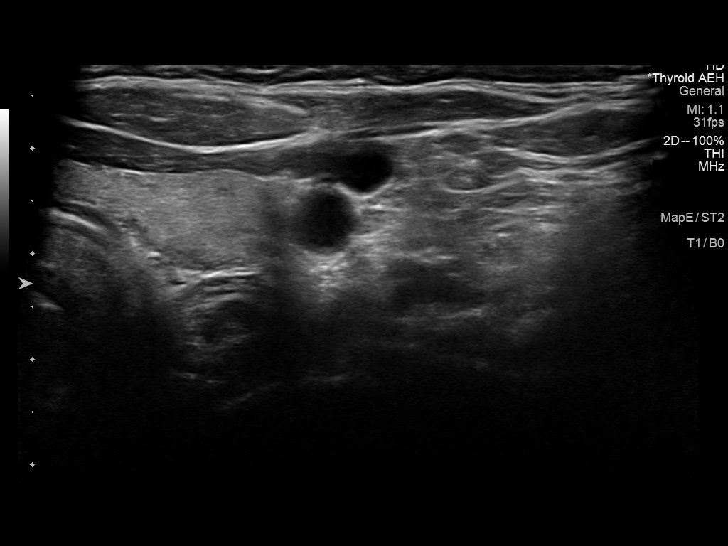
[im 36/43]
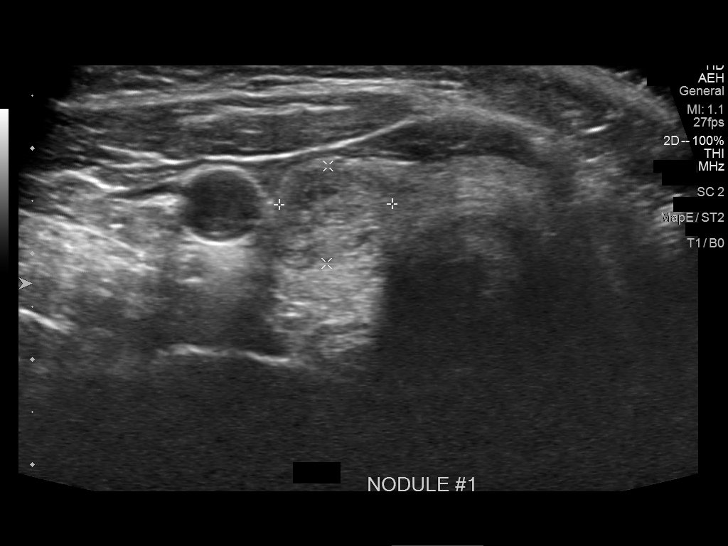
[im 39/43]
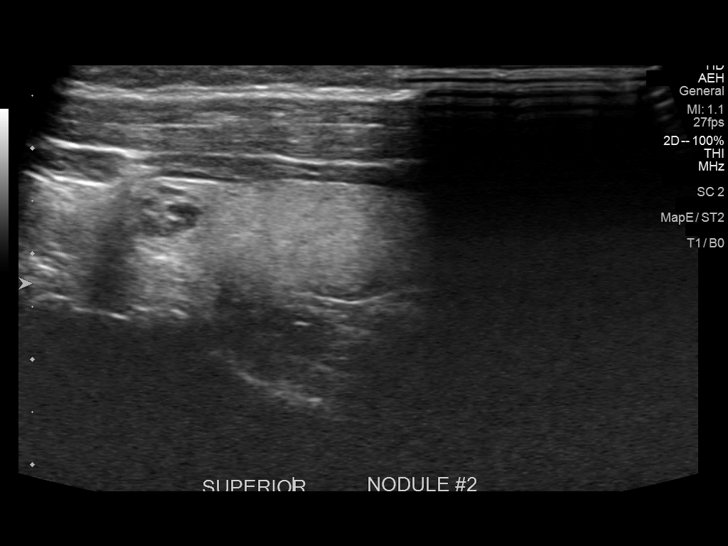
[im 43/43]
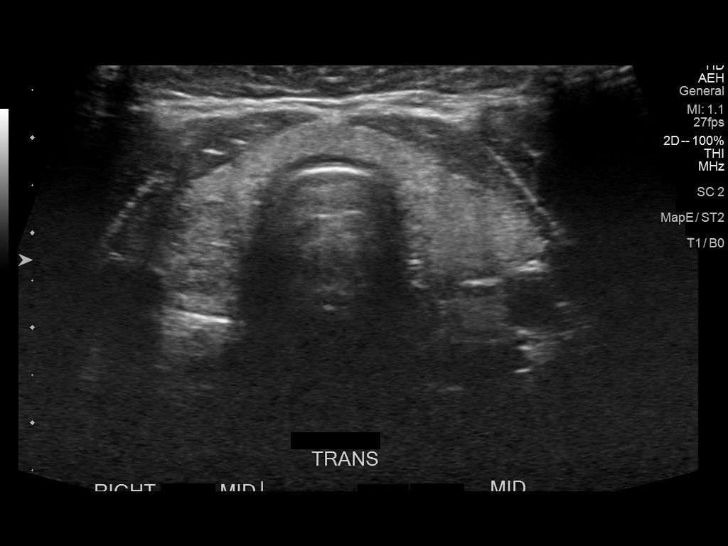

[13 of 25 positions shown; findings below may reference images not displayed]

FINDINGS: Parenchymal Echotexture: Mildly heterogenous

Isthmus: 0.4 cm

Right lobe: 4.7 x 1.5 x 1.3 cm

Left lobe: 4.3 x 1.0 x 1.6 cm

_________________________________________________________

Estimated total number of nodules >/= 1 cm: 1

Number of spongiform nodules >/=  2 cm not described below (TR1): 0

Number of mixed cystic and solid nodules >/= 1.5 cm not described
below (TR2): 0

_________________________________________________________

Nodule # 1:

Prior biopsy: Yes

Location: Right; Mid

Maximum size: 1.9 cm; Other 2 dimensions: 1.1 x 0.9 cm, previously,
1.7 cm

Composition: solid/almost completely solid (2)

Echogenicity: isoechoic (1)

Shape: not taller-than-wide (0)

Margins: ill-defined (0)

Echogenic foci: none (0)

ACR TI-RADS total points: 3.

ACR TI-RADS risk category:  TR3 (3 points).

Significant change in size (>/= 20% in two dimensions and minimal
increase of 2 mm): No

Change in features: No

Change in ACR TI-RADS risk category: Yes. The nodule is currently
felt to be more isoechoic thin hypoechoic and therefore a TR 3
lesion rather than a TR 4 lesion.

ACR TI-RADS recommendations:

*Given size (>/= 1.5 - 2.4 cm) and appearance, a follow-up
ultrasound in 1 year should be considered based on TI-RADS criteria.

_________________________________________________________

A 0.7 cm spongiform nodule in the superior left lobe does not meet
criteria for biopsy or follow-up. No abnormal lymph nodes
identified.
IMPRESSION: The right mid thyroid nodule demonstrates stable sonographic
appearance since 4384 and technically meets criteria for 1 year
follow-up ultrasound. This was reported to be as much as 2.9 cm in
diameter in 2662 and 2.4 cm in 0772. It appears at that time that
the nodule had a cystic component.

The above is in keeping with the ACR TI-RADS recommendations - [HOSPITAL] 6573;[DATE].

## 2021-08-23 ENCOUNTER — Encounter: Payer: Self-pay | Admitting: Endocrinology

## 2021-08-23 ENCOUNTER — Ambulatory Visit (INDEPENDENT_AMBULATORY_CARE_PROVIDER_SITE_OTHER): Payer: Medicare HMO | Admitting: Endocrinology

## 2021-08-23 VITALS — BP 156/70 | HR 97 | Ht 64.0 in | Wt 165.8 lb

## 2021-08-23 DIAGNOSIS — E042 Nontoxic multinodular goiter: Secondary | ICD-10-CM | POA: Diagnosis not present

## 2021-08-23 DIAGNOSIS — E119 Type 2 diabetes mellitus without complications: Secondary | ICD-10-CM

## 2021-08-23 LAB — POCT GLYCOSYLATED HEMOGLOBIN (HGB A1C): Hemoglobin A1C: 5.5 % (ref 4.0–5.6)

## 2021-08-23 LAB — TSH: TSH: 0.82 u[IU]/mL (ref 0.35–5.50)

## 2021-08-23 LAB — T4, FREE: Free T4: 1.05 ng/dL (ref 0.60–1.60)

## 2021-08-23 NOTE — Patient Instructions (Addendum)
Your blood pressure is high today.  Please see your primary care provider soon, to have it rechecked.   ?Blood tests are requested for you today.  We'll let you know about the results.   ?Let's recheck the ultrasound.  you will receive a phone call, about a day and time for an appointment.   ?You should have an endocrinology follow-up appointment in 6 months, or Dr Jerline Pain would be happy to help you.  ?

## 2021-08-23 NOTE — Progress Notes (Signed)
? ?Subjective:  ? ? Patient ID: Robin Rice, female    DOB: February 25, 1951, 71 y.o.   MRN: [id number] ? ?HPI ?Pt returns for f/u of diabetes mellitus:  ?DM type: 2 ?Dx'ed: 2017 ?Complications: none ?Therapy: Ozempic ?GDM: never ?DKA: never ?Severe hypoglycemia: never.   ?Pancreatitis: never ?Pancreatic imaging: never  ?Other: she has never been on insulin; edema limits rx options; PCP stopped metformin.   ?Interval history: pt states she feels well in general.  She says cbg's are well-controlled.   ?She also has multinodular goiter (dx'ed 2002; bx then showed follicular adenoma; [place] in 2021 was unchanged since 2018; she is euthyroid on synthroid).   ?Past Medical History:  ?Diagnosis Date  ? Blood transfusion without reported diagnosis 2000  ? Cataract 2012  ? DM (diabetes mellitus) ([place])   ? Fibroadenoma   ? Glaucoma, Left Eye   ? Goiter   ? Heart murmur   ? Hyperlipidemia   ? Hypertension   ? Hypothyroid   ? Osteoarthritis, knee   ? ? ?Past Surgical History:  ?Procedure Laterality Date  ? ABDOMINAL HYSTERECTOMY  2001  ? ABDOMINOPLASTY    ? CATARACT EXTRACTION W/ INTRAOCULAR LENS  IMPLANT, BILATERAL    ? EYE SURGERY  2012  ? FRACTURE SURGERY  2019  ? ? ?Social History  ? ?Socioeconomic History  ? Marital status: Married  ?  Spouse name: Not on file  ? Number of children: Not on file  ? Years of education: Not on file  ? Highest education level: Not on file  ?Occupational History  ? Occupation: Lawyer  ?  Employer: [employer]  ? Occupation: Retired  ?  Employer: [employer]  ?Tobacco Use  ? Smoking status: Never  ? Smokeless tobacco: Never  ?Vaping Use  ? Vaping Use: Never used  ?Substance and Sexual Activity  ? Alcohol use: Yes  ?  Alcohol/week: 1.0 standard drink  ?  Types: 1 Glasses of wine per week  ?  Comment: Socially  ? Drug use: No  ? Sexual activity: Yes  ?  Partners: Male  ?  Birth control/protection: Surgical  ?  Comment: TAH  ?Other Topics Concern  ? Not on file  ?Social History  Narrative  ? Not on file  ? ?Social Determinants of Health  ? ?Financial Resource Strain: Low Risk   ? Difficulty of Paying Living Expenses: Not hard at all  ?Food Insecurity: No Food Insecurity  ? Worried About Charity fundraiser in the Last Year: Never true  ? Ran Out of Food in the Last Year: Never true  ?Transportation Needs: No Transportation Needs  ? Lack of Transportation (Medical): No  ? Lack of Transportation (Non-Medical): No  ?Physical Activity: Sufficiently Active  ? Days of Exercise per Week: 4 days  ? Minutes of Exercise per Session: 90 min  ?Stress: No Stress Concern Present  ? Feeling of Stress : Not at all  ?Social Connections: Moderately Integrated  ? Frequency of Communication with Friends and Family: More than three times a week  ? Frequency of Social Gatherings with Friends and Family: Three times a week  ? Attends Religious Services: More than 4 times per year  ? Active Member of Clubs or Organizations: No  ? Attends Archivist Meetings: Never  ? Marital Status: Married  ?Intimate Partner Violence: Not At Risk  ? Fear of Current or Ex-Partner: No  ? Emotionally Abused: No  ? Physically Abused: No  ?  Sexually Abused: No  ? ? ?Current Outpatient Medications on File Prior to Visit  ?Medication Sig Dispense Refill  ? amLODipine (NORVASC) 10 MG tablet TAKE 1 TABLET BY MOUTH EVERY DAY 90 tablet 1  ? aspirin 81 MG tablet Take 81 mg by mouth daily.    ? atorvastatin (LIPITOR) 20 MG tablet TAKE 1 TABLET BY MOUTH EVERY DAY 90 tablet 1  ? Cholecalciferol (VITAMIN D3) 125 MCG (5000 UT) TABS Take by mouth.    ? glucose blood (ONETOUCH VERIO) test strip 1 each by Other route once a week. And lancets 1/day 30 each 1  ? Lancets (ONETOUCH ULTRASOFT) lancets Used to check blood sugars daily. DX CODE E11.9. 100 each 12  ? levothyroxine (SYNTHROID) 50 MCG tablet TAKE 1 TABLET BY MOUTH EVERY DAY 90 tablet 1  ? lisinopril-hydrochlorothiazide (ZESTORETIC) 20-25 MG tablet TAKE 1 TABLET BY MOUTH EVERY DAY 90  tablet 1  ? Semaglutide, 2 MG/DOSE, (OZEMPIC, 2 MG/DOSE,) 8 MG/3ML SOPN Inject 2 mg into the skin once a week. 9 mL 3  ? ?No current facility-administered medications on file prior to visit.  ? ? ?Allergies  ?Allergen Reactions  ? Other   ? ? ?Family History  ?Problem Relation Age of Onset  ? Heart disease Mother   ? Hypertension Father   ? Prostate cancer Father   ? Hypertension Brother   ? Hypertension Brother   ? Thyroid disease Neg Hx   ? Diabetes Neg Hx   ? ? ?BP (!) 156/70 (BP Location: Left Arm, Patient Position: Sitting, Cuff Size: Normal)   Pulse 97   Ht '5\' 4"'$  (1.626 m)   Wt 165 lb 12.8 oz (75.2 kg)   LMP 05/06/1999   SpO2 98%   BMI 28.46 kg/m?  ? ? ?Review of Systems ?Denies N/HB ?   ?Objective:  ? Physical Exam ?VITAL SIGNS:  See vs page ?GENERAL: no distress ?NECK: thyroid is slightly enlarged with irreg surface, but I can't tell details.   ? ? ?Lab Results  ?Component Value Date  ? HGBA1C 5.5 08/23/2021  ? ?Lab Results  ?Component Value Date  ? TSH 0.82 08/23/2021  ? ?   ?Assessment & Plan:  ?Type 2 DM: well-controlled.  Please continue the same Ozempic ?Hypothyroidism: well-controlled.  Please continue the same synthroid. ?MNG: I requested f/u US.  ? ?

## 2021-08-28 ENCOUNTER — Other Ambulatory Visit: Payer: Self-pay | Admitting: Family Medicine

## 2021-08-28 DIAGNOSIS — E782 Mixed hyperlipidemia: Secondary | ICD-10-CM

## 2021-10-31 ENCOUNTER — Ambulatory Visit: Payer: Medicare Other

## 2021-10-31 ENCOUNTER — Encounter: Payer: Medicare Other | Admitting: Family Medicine

## 2021-11-08 ENCOUNTER — Ambulatory Visit (INDEPENDENT_AMBULATORY_CARE_PROVIDER_SITE_OTHER): Payer: Medicare HMO | Admitting: Family Medicine

## 2021-11-08 ENCOUNTER — Encounter: Payer: Self-pay | Admitting: Family Medicine

## 2021-11-08 ENCOUNTER — Ambulatory Visit: Payer: Medicare HMO

## 2021-11-08 VITALS — BP 120/80 | HR 91 | Temp 98.0°F | Ht 64.0 in | Wt 165.0 lb

## 2021-11-08 DIAGNOSIS — Z0001 Encounter for general adult medical examination with abnormal findings: Secondary | ICD-10-CM | POA: Diagnosis not present

## 2021-11-08 DIAGNOSIS — E119 Type 2 diabetes mellitus without complications: Secondary | ICD-10-CM

## 2021-11-08 DIAGNOSIS — I152 Hypertension secondary to endocrine disorders: Secondary | ICD-10-CM

## 2021-11-08 DIAGNOSIS — E785 Hyperlipidemia, unspecified: Secondary | ICD-10-CM | POA: Diagnosis not present

## 2021-11-08 DIAGNOSIS — E1169 Type 2 diabetes mellitus with other specified complication: Secondary | ICD-10-CM

## 2021-11-08 DIAGNOSIS — E038 Other specified hypothyroidism: Secondary | ICD-10-CM | POA: Diagnosis not present

## 2021-11-08 DIAGNOSIS — E1159 Type 2 diabetes mellitus with other circulatory complications: Secondary | ICD-10-CM

## 2021-11-08 LAB — CBC
HCT: 38.4 % (ref 36.0–46.0)
Hemoglobin: 12.6 g/dL (ref 12.0–15.0)
MCHC: 32.9 g/dL (ref 30.0–36.0)
MCV: 89.5 fl (ref 78.0–100.0)
Platelets: 191 10*3/uL (ref 150.0–400.0)
RBC: 4.29 Mil/uL (ref 3.87–5.11)
RDW: 13 % (ref 11.5–15.5)
WBC: 7.9 10*3/uL (ref 4.0–10.5)

## 2021-11-08 LAB — LIPID PANEL
Cholesterol: 162 mg/dL (ref 0–200)
HDL: 78.3 mg/dL (ref >39.00)
LDL Cholesterol: 70 mg/dL (ref 0–99)
NonHDL: 83.38
Total CHOL/HDL Ratio: 2
Triglycerides: 66 mg/dL (ref 0.0–149.0)
VLDL: 13.2 mg/dL (ref 0.0–40.0)

## 2021-11-08 LAB — COMPREHENSIVE METABOLIC PANEL
ALT: 16 U/L (ref 0–35)
AST: 23 U/L (ref 0–37)
Albumin: 4.3 g/dL (ref 3.5–5.2)
Alkaline Phosphatase: 80 U/L (ref 39–117)
BUN: 17 mg/dL (ref 6–23)
CO2: 30 mEq/L (ref 19–32)
Calcium: 10.2 mg/dL (ref 8.4–10.5)
Chloride: 100 mEq/L (ref 96–112)
Creatinine, Ser: 0.83 mg/dL (ref 0.40–1.20)
GFR: 71.83 mL/min (ref >60.00)
Glucose, Bld: 89 mg/dL (ref 70–99)
Potassium: 3.4 mEq/L — ABNORMAL LOW (ref 3.5–5.1)
Sodium: 139 mEq/L (ref 135–145)
Total Bilirubin: 0.7 mg/dL (ref 0.2–1.2)
Total Protein: 7.6 g/dL (ref 6.0–8.3)

## 2021-11-08 LAB — TSH: TSH: 1.11 u[IU]/mL (ref 0.35–5.50)

## 2021-11-08 NOTE — Assessment & Plan Note (Signed)
At goal today on amlodipine 10 mg daily and lisinopril-HCTZ 20-25 once daily.  Check labs. 

## 2021-11-08 NOTE — Progress Notes (Signed)
Chief Complaint:  Robin Rice is a 71 y.o. female who presents today for her annual comprehensive physical exam.    Assessment/Plan:  Chronic Problems Addressed Today: Hyperlipidemia associated with type 2 diabetes mellitus ([place]) Check labs.  She is on Lipitor 20 mg daily tolerating well.  Hypertension associated with diabetes ([place]) At goal today on amlodipine 10 mg daily and lisinopril-HCTZ 20-25 once daily.  Check labs.  Hypothyroid Check TSH.  She is currently on Synthroid 50 mcg daily.  Her endocrinologist retired and she will be deferring further management of this.  Controlled type 2 diabetes mellitus without complication, without long-term current use of insulin (HCC) Her A1c have been well controlled.  Endocrinologist is retiring and she will be transferring management of her diabetes overdose.  We will continue Ozempic 2 mg weekly.  It is too early to recheck A1c today.  She will follow-up in 3 to 6 months and we can recheck A1c at that time.  We discussed lifestyle modifications.  Preventative Healthcare: Check labs. Mammogram later this year. Colonoscopy due next year.   Patient Counseling(The following topics were reviewed and/or handout was given):  -Nutrition: Stressed importance of moderation in sodium/caffeine intake, saturated fat and cholesterol, caloric balance, sufficient intake of fresh fruits, vegetables, and fiber.  -Stressed the importance of regular exercise.   -Substance Abuse: Discussed cessation/primary prevention of tobacco, alcohol, or other drug use; driving or other dangerous activities under the influence; availability of treatment for abuse.   -Injury prevention: Discussed safety belts, safety helmets, smoke detector, smoking near bedding or upholstery.   -Sexuality: Discussed sexually transmitted diseases, partner selection, use of condoms, avoidance of unintended pregnancy and contraceptive alternatives.   -Dental health: Discussed importance of  regular tooth brushing, flossing, and dental visits.  -Health maintenance and immunizations reviewed. Please refer to Health maintenance section.  Return to care in 1 year for next preventative visit.     Subjective:  HPI:  She has no acute complaints today. See A/p for status of chronic conditions.   Lifestyle Diet: Balanced. Plenty of fruits and vegetables.  Exercise: Walks regularly.      11/08/2021    8:45 AM  Depression screen PHQ 2/9  Decreased Interest 0  Down, Depressed, Hopeless 0  PHQ - 2 Score 0    Health Maintenance Due  Topic Date Due   OPHTHALMOLOGY EXAM  10/27/2018   COVID-19 Vaccine (5 - Pfizer series) 03/13/2021     ROS: Per HPI, otherwise a complete review of systems was negative.   PMH:  The following were reviewed and entered/updated in epic: Past Medical History:  Diagnosis Date   Blood transfusion without reported diagnosis 2000   Cataract 2012   DM (diabetes mellitus) ([place])    Fibroadenoma    Glaucoma, Left Eye    Goiter    Heart murmur    Hyperlipidemia    Hypertension    Hypothyroid    Osteoarthritis, knee    Patient Active Problem List   Diagnosis Date Noted   Multinodular goiter 03/06/2020   Hypothyroid    Osteoarthritis, knee 09/20/2015   Controlled type 2 diabetes mellitus without complication, without long-term current use of insulin ([place]) 09/20/2015   Cardiac murmur 09/20/2015   Hypertension associated with diabetes ([place])    Hyperlipidemia associated with type 2 diabetes mellitus ([place])    Glaucoma, left eye    Past Surgical History:  Procedure Laterality Date   ABDOMINAL HYSTERECTOMY  2001   ABDOMINOPLASTY  CATARACT EXTRACTION W/ INTRAOCULAR LENS  IMPLANT, BILATERAL     EYE SURGERY  2012   FRACTURE SURGERY  2019    Family History  Problem Relation Age of Onset   Heart disease Mother    Hypertension Father    Prostate cancer Father    Hypertension Brother    Hypertension Brother    Thyroid disease Neg Hx     Diabetes Neg Hx     Medications- reviewed and updated Current Outpatient Medications  Medication Sig Dispense Refill   amLODipine (NORVASC) 10 MG tablet TAKE 1 TABLET BY MOUTH EVERY DAY 90 tablet 1   aspirin 81 MG tablet Take 81 mg by mouth daily.     atorvastatin (LIPITOR) 20 MG tablet TAKE 1 TABLET BY MOUTH EVERY DAY 90 tablet 1   Cholecalciferol (VITAMIN D3) 125 MCG (5000 UT) TABS Take by mouth.     glucose blood (ONETOUCH VERIO) test strip 1 each by Other route once a week. And lancets 1/day 30 each 1   Lancets (ONETOUCH ULTRASOFT) lancets Used to check blood sugars daily. DX CODE E11.9. 100 each 12   levothyroxine (SYNTHROID) 50 MCG tablet TAKE 1 TABLET BY MOUTH EVERY DAY 90 tablet 1   lisinopril-hydrochlorothiazide (ZESTORETIC) 20-25 MG tablet TAKE 1 TABLET BY MOUTH EVERY DAY 90 tablet 1   Semaglutide, 2 MG/DOSE, (OZEMPIC, 2 MG/DOSE,) 8 MG/3ML SOPN Inject 2 mg into the skin once a week. 9 mL 3   No current facility-administered medications for this visit.   Allergies-reviewed and updated Allergies  Allergen Reactions   Other     Social History   Socioeconomic History   Marital status: Married    Spouse name: Not on file   Number of children: Not on file   Years of education: Not on file   Highest education level: Not on file  Occupational History   Occupation: Programmer, systems: [employer]   Occupation: Retired    Fish farm manager: [place] [employer]  Tobacco Use   Smoking status: Never   Smokeless tobacco: Never  Vaping Use   Vaping Use: Never used  Substance and Sexual Activity   Alcohol use: Yes    Alcohol/week: 1.0 standard drink of alcohol    Types: 1 Glasses of wine per week    Comment: Socially   Drug use: No   Sexual activity: Yes    Partners: Male    Birth control/protection: Surgical    Comment: TAH  Other Topics Concern   Not on file  Social History Narrative   Not on file   Social Determinants of Health   Financial Resource  Strain: Low Risk  (10/26/2020)   Overall Financial Resource Strain (CARDIA)    Difficulty of Paying Living Expenses: Not hard at all  Food Insecurity: No Food Insecurity (10/26/2020)   Hunger Vital Sign    Worried About Running Out of Food in the Last Year: Never true    [place] in the Last Year: Never true  Transportation Needs: No Transportation Needs (10/26/2020)   PRAPARE - Hydrologist (Medical): No    Lack of Transportation (Non-Medical): No  Physical Activity: Sufficiently Active (10/26/2020)   Exercise Vital Sign    Days of Exercise per Week: 4 days    Minutes of Exercise per Session: 90 min  Stress: No Stress Concern Present (10/26/2020)   [place]    Feeling of  Stress : Not at all  Social Connections: Moderately Integrated (10/26/2020)   Social Connection and Isolation Panel [NHANES]    Frequency of Communication with Friends and Family: More than three times a week    Frequency of Social Gatherings with Friends and Family: Three times a week    Attends Religious Services: More than 4 times per year    Active Member of Clubs or Organizations: No    Attends Archivist Meetings: Never    Marital Status: Married        Objective:  Physical Exam: BP 120/80   Pulse 91   Temp 98 F (36.7 C) (Temporal)   Ht '5\' 4"'$  (1.626 m)   Wt 165 lb (74.8 kg)   LMP 05/06/1999   SpO2 99%   BMI 28.32 kg/m   Body mass index is 28.32 kg/m. Wt Readings from Last 3 Encounters:  11/08/21 165 lb (74.8 kg)  08/23/21 165 lb 12.8 oz (75.2 kg)  02/15/21 167 lb 3.2 oz (75.8 kg)   Gen: NAD, resting comfortably HEENT: TMs normal bilaterally. OP clear. No thyromegaly noted.  CV: RRR with no murmurs appreciated Pulm: NWOB, CTAB with no crackles, wheezes, or rhonchi GI: Normal bowel sounds present. Soft, Nontender, Nondistended. MSK: no edema, cyanosis, or clubbing noted Skin: warm,  dry Neuro: CN2-12 grossly intact. Strength 5/5 in upper and lower extremities. Reflexes symmetric and intact bilaterally.  Psych: Normal affect and thought content     Zerick Prevette M. Jerline Pain, MD 11/08/2021 9:11 AM

## 2021-11-08 NOTE — Assessment & Plan Note (Signed)
Check TSH.  She is currently on Synthroid 50 mcg daily.  Her endocrinologist retired and she will be deferring further management of this.

## 2021-11-08 NOTE — Assessment & Plan Note (Signed)
Check labs.  She is on Lipitor 20 mg daily tolerating well.

## 2021-11-08 NOTE — Patient Instructions (Signed)
It was very nice to see you today!  We will check blood work today.  Please continue the good work with your diet and exercise.  Please come back in 6 months for follow-up for your A1c.  Please come back to see [place] sooner if needed.  Take care, Dr Jerline Pain  PLEASE NOTE:  If you had any lab tests please let us know if you have not heard back within a few days. You may see your results on mychart before we have a chance to review them but we will give you a call once they are reviewed by [place]. If we ordered any referrals today, please let us know if you have not heard from their office within the next week.   Please try these tips to maintain a healthy lifestyle:  Eat at least 3 REAL meals and 1-2 snacks per day.  Aim for no more than 5 hours between eating.  If you eat breakfast, please do so within one hour of getting up.   Each meal should contain half fruits/vegetables, one quarter protein, and one quarter carbs (no bigger than a computer mouse)  Cut down on sweet beverages. This includes juice, soda, and sweet tea.   Drink at least 1 glass of water with each meal and aim for at least 8 glasses per day  Exercise at least 150 minutes every week.    Preventive Care 71 Years and Older, Female Preventive care refers to lifestyle choices and visits with your health care provider that can promote health and wellness. Preventive care visits are also called wellness exams. What can I expect for my preventive care visit? Counseling Your health care provider may ask you questions about your: Medical history, including: Past medical problems. Family medical history. Pregnancy and menstrual history. History of falls. Current health, including: Memory and ability to understand (cognition). Emotional well-being. Home life and relationship well-being. Sexual activity and sexual health. Lifestyle, including: Alcohol, nicotine or tobacco, and drug use. Access to firearms. Diet, exercise, and  sleep habits. Work and work Statistician. Sunscreen use. Safety issues such as seatbelt and bike helmet use. Physical exam Your health care provider will check your: Height and weight. These may be used to calculate your BMI (body mass index). BMI is a measurement that tells if you are at a healthy weight. Waist circumference. This measures the distance around your waistline. This measurement also tells if you are at a healthy weight and may help predict your risk of certain diseases, such as type 2 diabetes and high blood pressure. Heart rate and blood pressure. Body temperature. Skin for abnormal spots. What immunizations do I need?  Vaccines are usually given at various ages, according to a schedule. Your health care provider will recommend vaccines for you based on your age, medical history, and lifestyle or other factors, such as travel or where you work. What tests do I need? Screening Your health care provider may recommend screening tests for certain conditions. This may include: Lipid and cholesterol levels. Hepatitis C test. Hepatitis B test. HIV (human immunodeficiency virus) test. STI (sexually transmitted infection) testing, if you are at risk. Lung cancer screening. Colorectal cancer screening. Diabetes screening. This is done by checking your blood sugar (glucose) after you have not eaten for a while (fasting). Mammogram. Talk with your health care provider about how often you should have regular mammograms. BRCA-related cancer screening. This may be done if you have a family history of breast, ovarian, tubal, or peritoneal cancers. Bone  density scan. This is done to screen for osteoporosis. Talk with your health care provider about your test results, treatment options, and if necessary, the need for more tests. Follow these instructions at home: Eating and drinking  Eat a diet that includes fresh fruits and vegetables, whole grains, lean protein, and low-fat dairy  products. Limit your intake of foods with high amounts of sugar, saturated fats, and salt. Take vitamin and mineral supplements as recommended by your health care provider. Do not drink alcohol if your health care provider tells you not to drink. If you drink alcohol: Limit how much you have to 0-1 drink a day. Know how much alcohol is in your drink. In the [place] one drink equals one 12 oz bottle of beer (355 mL), one 5 oz glass of wine (148 mL), or one 1 oz glass of hard liquor (44 mL). Lifestyle Brush your teeth every morning and night with fluoride toothpaste. Floss one time each day. Exercise for at least 30 minutes 5 or more days each week. Do not use any products that contain nicotine or tobacco. These products include cigarettes, chewing tobacco, and vaping devices, such as e-cigarettes. If you need help quitting, ask your health care provider. Do not use drugs. If you are sexually active, practice safe sex. Use a condom or other form of protection in order to prevent STIs. Take aspirin only as told by your health care provider. Make sure that you understand how much to take and what form to take. Work with your health care provider to find out whether it is safe and beneficial for you to take aspirin daily. Ask your health care provider if you need to take a cholesterol-lowering medicine (statin). Find healthy ways to manage stress, such as: Meditation, yoga, or listening to music. Journaling. Talking to a trusted person. Spending time with friends and family. Minimize exposure to UV radiation to reduce your risk of skin cancer. Safety Always wear your seat belt while driving or riding in a vehicle. Do not drive: If you have been drinking alcohol. Do not ride with someone who has been drinking. When you are tired or distracted. While texting. If you have been using any mind-altering substances or drugs. Wear a helmet and other protective equipment during sports activities. If you  have firearms in your house, make sure you follow all gun safety procedures. What's next? Visit your health care provider once a year for an annual wellness visit. Ask your health care provider how often you should have your eyes and teeth checked. Stay up to date on all vaccines. This information is not intended to replace advice given to you by your health care provider. Make sure you discuss any questions you have with your health care provider. Document Revised: 10/17/2020 Document Reviewed: 10/17/2020 Elsevier Patient Education  [employer].

## 2021-11-08 NOTE — Assessment & Plan Note (Signed)
Her A1c have been well controlled.  Endocrinologist is retiring and she will be transferring management of her diabetes overdose.  We will continue Ozempic 2 mg weekly.  It is too early to recheck A1c today.  She will follow-up in 3 to 6 months and we can recheck A1c at that time.  We discussed lifestyle modifications.

## 2021-11-12 NOTE — Progress Notes (Signed)
Please inform patient of the following:  Great news!  Labs are all stable.  Would like for her to keep up the good work and we can recheck in year.

## 2021-11-22 ENCOUNTER — Other Ambulatory Visit: Payer: Self-pay | Admitting: Family Medicine

## 2021-11-26 ENCOUNTER — Other Ambulatory Visit: Payer: Self-pay | Admitting: Family Medicine

## 2021-11-26 DIAGNOSIS — I152 Hypertension secondary to endocrine disorders: Secondary | ICD-10-CM

## 2021-12-31 ENCOUNTER — Telehealth: Payer: 59

## 2022-01-09 ENCOUNTER — Telehealth (INDEPENDENT_AMBULATORY_CARE_PROVIDER_SITE_OTHER): Payer: Medicare HMO | Admitting: Family Medicine

## 2022-01-09 ENCOUNTER — Encounter: Payer: Self-pay | Admitting: Family Medicine

## 2022-01-09 VITALS — Temp 98.1°F | Ht 64.0 in | Wt 165.0 lb

## 2022-01-09 DIAGNOSIS — U071 COVID-19: Secondary | ICD-10-CM

## 2022-01-09 MED ORDER — BENZONATATE 100 MG PO CAPS: ORAL_CAPSULE | ORAL | 0 refills | Status: DC

## 2022-01-09 MED ORDER — NIRMATRELVIR/RITONAVIR (PAXLOVID)TABLET: 3.0000 | ORAL_TABLET | Freq: Two times a day (BID) | ORAL | 0 refills | Status: AC

## 2022-01-09 NOTE — Patient Instructions (Signed)
HOME CARE TIPS:   -I sent the medication(s) we discussed to your pharmacy: Meds ordered this encounter  Medications   nirmatrelvir/ritonavir EUA (PAXLOVID) 20 x 150 MG & 10 x '100MG'$  TABS    Sig: Take 3 tablets by mouth 2 (two) times daily for 5 days. (Take nirmatrelvir 150 mg two tablets twice daily for 5 days and ritonavir 100 mg one tablet twice daily for 5 days) Patient GFR is > 60    Dispense:  30 tablet    Refill:  0   benzonatate (TESSALON PERLES) 100 MG capsule    Sig: 1-2 capsules up to twice daily as needed for cough    Dispense:  30 capsule    Refill:  0     -I sent in the [place] treatment or referral you requested per our discussion. Please see the information provided below and discuss further with the pharmacist/treatment team.  -If taking Paxlovid, please review all medications, supplement and over the counter drugs with your pharmacist and ask them to check for any interactions. Please make the following changes to your regular medications while taking Paxlovid: STOP the Lipitor (atorvastatin) and restart 3 days after finishing Paxlovid Take 1/2 tablet ('5mg'$ ) of your Norvasc (amlodipine) while taking the Paxlovid  -there is a chance of rebound illness with covid after improving. This can happen whether or not you take an antiviral treatment. If you become sick again with covid after getting better, please schedule a follow up virtual visit and isolate again.  -can use tylenol if needed for fevers, aches and pains per instructions  -nasal saline sinus rinses twice daily  -stay hydrated, drink plenty of fluids and eat small healthy meals - avoid dairy  -follow up with your doctor in 2-3 days unless improving and feeling better  -stay home while sick, except to seek medical care. If you have COVID19, you will likely be contagious for 7-10 days. Flu or Influenza is likely contagious for about 7 days. Other respiratory viral infections remain contagious for 5-10+ days  depending on the virus and many other factors. Wear a good mask that fits snugly (such as N95 or KN95) if around others to reduce the risk of transmission.  It was nice to meet you today, and I really hope you are feeling better soon. I help [facility] out with telemedicine visits on Tuesdays and Thursdays and am happy to help if you need a follow up virtual visit on those days. Otherwise, if you have any concerns or questions following this visit please schedule a follow up visit with your Primary Care doctor or seek care at a local urgent care clinic to avoid delays in care.    Seek in person care or schedule a follow up video visit promptly if your symptoms worsen, new concerns arise or you are not improving with treatment. Call 911 and/or seek emergency care if your symptoms are severe or life threatening.    See the following link for the most recent information regarding Paxlovid:  [email or web address]   Nirmatrelvir; Ritonavir Tablets What is this medication? NIRMATRELVIR; RITONAVIR (NIR ma TREL vir; ri TOE na veer) treats mild to moderate COVID-19. It may help people who are at high risk of developing severe illness. This medication works by limiting the spread of the virus in your body. The FDA has allowed the emergency use of this medication. This medicine may be used for other purposes; ask your health care provider or pharmacist if you have questions. COMMON BRAND NAME(S): PAXLOVID  What should I tell my care team before I take this medication? They need to know if you have any of these conditions: Any allergies Any serious illness Kidney disease Liver disease An unusual or allergic reaction to nirmatrelvir, ritonavir, other medications, foods, dyes, or preservatives Pregnant or trying to get pregnant Breast-feeding How should I use this medication? This product contains 2 different medications that are packaged together. For the standard dose, take 2 pink tablets of nirmatrelvir  with 1 white tablet of ritonavir (3 tablets total) by mouth with water twice daily. Talk to your care team if you have kidney disease. You may need a different dose. Swallow the tablets whole. You can take it with or without food. If it upsets your stomach, take it with food. Take all of this medication unless your care team tells you to stop it early. Keep taking it even if you think you are better. Talk to your care team about the use of this medication in children. While it may be prescribed for children as young as 12 years for selected conditions, precautions do apply. Overdosage: If you think you have taken too much of this medicine contact a poison control center or emergency room at once. NOTE: This medicine is only for you. Do not share this medicine with others. What if I miss a dose? If you miss a dose, take it as soon as you can unless it is more than 8 hours late. If it is more than 8 hours late, skip the missed dose. Take the next dose at the normal time. Do not take extra or 2 doses at the same time to make up for the missed dose. What may interact with this medication? Do not take this medication with any of the following medications: Alfuzosin Certain medications for anxiety or sleep like midazolam, triazolam Certain medications for cancer like apalutamide, enzalutamide Certain medications for cholesterol like lovastatin, simvastatin Certain medications for irregular heart beat like amiodarone, dronedarone, flecainide, propafenone, quinidine Certain medications for pain like meperidine, piroxicam Certain medications for psychotic disorders like clozapine, lurasidone, pimozide Certain medications for seizures like carbamazepine, phenobarbital, phenytoin Colchicine Eletriptan Eplerenone Ergot alkaloids like dihydroergotamine, ergonovine, ergotamine, methylergonovine Finerenone Flibanserin Ivabradine Lomitapide Naloxegol Ranolazine Rifampin Sildenafil Silodosin [facility]  Wort Tolvaptan Ubrogepant Voclosporin This medication may also interact with the following medications: Bedaquiline Birth control pills Bosentan Certain antibiotics like erythromycin or clarithromycin Certain medications for blood pressure like amlodipine, diltiazem, felodipine, nicardipine, nifedipine Certain medications for cancer like abemaciclib, ceritinib, dasatinib, encorafenib, ibrutinib, ivosidenib, neratinib, nilotinib, venetoclax, vinblastine, vincristine Certain medications for cholesterol like atorvastatin, rosuvastatin Certain medications for depression like bupropion, trazodone Certain medications for fungal infections like isavuconazonium, itraconazole, ketoconazole, voriconazole Certain medications for hepatitis C like elbasvir; grazoprevir, dasabuvir; ombitasvir; paritaprevir; ritonavir, glecaprevir; pibrentasvir, sofosbuvir; velpatasvir; voxilaprevir Certain medications for HIV or AIDS Certain medications for irregular heartbeat like lidocaine Certain medications that treat or prevent blood clots like rivaroxaban, warfarin Digoxin Fentanyl Medications that lower your chance of fighting infection like cyclosporine, sirolimus, tacrolimus Methadone Quetiapine Rifabutin Salmeterol Steroid medications like betamethasone, budesonide, ciclesonide, dexamethasone, fluticasone, methylprednisone, mometasone, triamcinolone This list may not describe all possible interactions. Give your health care provider a list of all the medicines, herbs, non-prescription drugs, or dietary supplements you use. Also tell them if you smoke, drink alcohol, or use illegal drugs. Some items may interact with your medicine. What should I watch for while using this medication? Your condition will be monitored carefully while you are receiving this medication. Visit  your care team for regular checkups. Tell your care team if your symptoms do not start to get better or if they get worse. If you have  untreated HIV infection, this medication may lead to some HIV medications not working as well in the future. Birth control may not work properly while you are taking this medication. Talk to your care team about using an extra method of birth control. What side effects may I notice from receiving this medication? Side effects that you should report to your care team as soon as possible: Allergic reactions--skin rash, itching, hives, swelling of the face, lips, tongue, or throat Liver injury--right upper belly pain, loss of appetite, nausea, light-colored stool, dark yellow or brown urine, yellowing skin or eyes, unusual weakness or fatigue Redness, blistering, peeling, or loosening of the skin, including inside the mouth Side effects that usually do not require medical attention (report these to your care team if they continue or are bothersome): Change in taste Diarrhea General discomfort and fatigue Increase in blood pressure Muscle pain Nausea Stomach pain This list may not describe all possible side effects. Call your doctor for medical advice about side effects. You may report side effects to FDA at 1-800-FDA-1088. Where should I keep my medication? Keep out of the reach of children and pets. Store at room temperature between 20 and 25 degrees C (68 and 77 degrees F). Get rid of any unused medication after the expiration date. To get rid of medications that are no longer needed or have expired: Take the medication to a medication take-back program. Check with your pharmacy or law enforcement to find a location. If you cannot return the medication, check the label or package insert to see if the medication should be thrown out in the garbage or flushed down the toilet. If you are not sure, ask your care team. If it is safe to put it in the trash, take the medication out of the container. Mix the medication with cat litter, dirt, coffee grounds, or other unwanted substance. Seal the mixture  in a bag or container. Put it in the trash. NOTE: This sheet is a summary. It may not cover all possible information. If you have questions about this medicine, talk to your doctor, pharmacist, or health care provider.  2022 [employer]vier/Gold Standard (2021-01-21 00:00:00)

## 2022-01-09 NOTE — Progress Notes (Signed)
Virtual Visit via Video Note  I connected with Robin Rice  on 01/09/22 at 11:20 AM EDT by a video enabled telemedicine application and verified that I am speaking with the correct person using two identifiers.  Location patient: [place] Location provider:work or home office Persons participating in the virtual visit: patient, provider  I discussed the limitations and requested verbal permission for telemedicine visit. The patient expressed understanding and agreed to proceed.   HPI:  Acute telemedicine visit for Robin Rice: -Onset: 3 days ago, tested positive last night -husband has covid as well -Symptoms include: nasal congestion, mild cough yesterday, scratchy throat, mild body aches -Denies: fever, CP, SOB, NVD -she is drinking fluids  -Has tried: nyquil, advill -Pertinent past medical history: see below, has not had covid, GFR 71 in 7/23 -Pertinent medication allergies: Allergies  Allergen Reactions   Other   -COVID-19 vaccine status: had all doses of vaccine and booster Immunization History  Administered Date(s) Administered   Fluad Quad(high Dose 65+) 01/06/2019, 01/16/2021   Influenza, High Dose Seasonal PF 02/01/2018   Influenza,inj,Quad PF,6+ Mos 01/06/2017   Influenza-Unspecified 02/15/2015, 01/07/2016, 01/16/2020   PFIZER(Purple Top)SARS-COV-2 Vaccination 06/12/2019, 07/07/2019, 08/27/2020, 01/16/2021   Pfizer Covid-19 Vaccine Bivalent Booster 13yr & up 01/16/2021, 09/23/2021   Pneumococcal Conjugate-13 02/15/2015   Pneumococcal Polysaccharide-23 09/15/2017   Tdap 05/18/2007, 02/01/2018   Zoster Recombinat (Shingrix) 10/31/2017, 02/08/2018   Zoster, Live 10/07/2012     ROS: See pertinent positives and negatives per HPI.  Past Medical History:  Diagnosis Date   Blood transfusion without reported diagnosis 2000   Cataract 2012   DM (diabetes mellitus) [place]    Fibroadenoma    Glaucoma, Left Eye    Goiter    Heart murmur    Hyperlipidemia    Hypertension     Hypothyroid    Osteoarthritis, knee     Past Surgical History:  Procedure Laterality Date   ABDOMINAL HYSTERECTOMY  2001   ABDOMINOPLASTY     CATARACT EXTRACTION W/ INTRAOCULAR LENS  IMPLANT, BILATERAL     EYE SURGERY  2012   FRACTURE SURGERY  2019     Current Outpatient Medications:    amLODipine (NORVASC) 10 MG tablet, TAKE 1 TABLET BY MOUTH EVERY DAY, Disp: 90 tablet, Rfl: 1   aspirin 81 MG tablet, Take 81 mg by mouth daily., Disp: , Rfl:    atorvastatin (LIPITOR) 20 MG tablet, TAKE 1 TABLET BY MOUTH EVERY DAY, Disp: 90 tablet, Rfl: 1   benzonatate (TESSALON PERLES) 100 MG capsule, 1-2 capsules up to twice daily as needed for cough, Disp: 30 capsule, Rfl: 0   Cholecalciferol (VITAMIN D3) 125 MCG (5000 UT) TABS, Take by mouth., Disp: , Rfl:    glucose blood (ONETOUCH VERIO) test strip, 1 each by Other route once a week. And lancets 1/day, Disp: 30 each, Rfl: 1   Lancets (ONETOUCH ULTRASOFT) lancets, Used to check blood sugars daily. DX CODE E11.9., Disp: 100 each, Rfl: 12   levothyroxine (SYNTHROID) 50 MCG tablet, TAKE 1 TABLET BY MOUTH EVERY DAY, Disp: 90 tablet, Rfl: 1   lisinopril-hydrochlorothiazide (ZESTORETIC) 20-25 MG tablet, TAKE 1 TABLET BY MOUTH EVERY DAY, Disp: 90 tablet, Rfl: 1   nirmatrelvir/ritonavir EUA (PAXLOVID) 20 x 150 MG & 10 x '100MG'$  TABS, Take 3 tablets by mouth 2 (two) times daily for 5 days. (Take nirmatrelvir 150 mg two tablets twice daily for 5 days and ritonavir 100 mg one tablet twice daily for 5 days) Patient GFR is > 60, Disp: 30 tablet, Rfl:  0   Semaglutide, 2 MG/DOSE, (OZEMPIC, 2 MG/DOSE,) 8 MG/3ML SOPN, Inject 2 mg into the skin once a week., Disp: 9 mL, Rfl: 3  EXAM:  VITALS per patient if applicable: denies fever  GENERAL: alert, oriented, appears well and in no acute distress  HEENT: atraumatic, conjunttiva clear, no obvious abnormalities on inspection of external nose and ears  NECK: normal movements of the head and neck  LUNGS: on inspection  no signs of respiratory distress, breathing rate appears normal, no obvious gross SOB, gasping or wheezing  CV: no obvious cyanosis  MS: moves all visible extremities without noticeable abnormality  PSYCH/NEURO: pleasant and cooperative, no obvious depression or anxiety, speech and thought processing grossly intact  ASSESSMENT AND PLAN:  Discussed the following assessment and plan:  COVID-19   Discussed treatment options, side effect and risk of drug interactions, ideal treatment window, potential complications, isolation and precautions for COVID-19.  Discussed possibility of rebound with or without antivirals. Checked for/reviewed last GFR - listed in HPI if available.  After lengthy discussion, the patient opted for treatment with Paxlovid due to being higher risk for complications of covid or severe disease and other factors. Discussed EUA status of this drug and the fact that there is preliminary limited knowledge of risks/interactions/side effects per EUA document vs possible benefits and precautions. This information was shared with patient during the visit and also was provided in patient instructions. She agrees to hold statin and take half dose norvasc/monitor BP. Also, advised that patient discuss risks/interactions and use with pharmacist/treatment team as well.  The patient did want a prescription for cough, Tessalon Rx sent.  Other symptomatic care measures summarized in patient instructions. Work/School slipped offered: declined Advised to seek prompt virtual visit or in person care if worsening, new symptoms arise, or if is not improving with treatment as expected per our conversation of expected course. Discussed options for follow up care. Did let this patient know that I do telemedicine on Tuesdays and Thursdays for [facility] and those are the days I am logged into the system. Advised to schedule follow up visit with PCP, [facility] virtual visits or [facility] if any further questions or  concerns to avoid delays in care.   I discussed the assessment and treatment plan with the patient. The patient was provided an opportunity to ask questions and all were answered. The patient agreed with the plan and demonstrated an understanding of the instructions.     Lucretia Kern, DO

## 2022-01-21 ENCOUNTER — Ambulatory Visit: Payer: 59

## 2022-01-23 ENCOUNTER — Ambulatory Visit (INDEPENDENT_AMBULATORY_CARE_PROVIDER_SITE_OTHER): Payer: Medicare HMO

## 2022-01-23 DIAGNOSIS — Z0000 Encounter for general adult medical examination without abnormal findings: Secondary | ICD-10-CM | POA: Diagnosis not present

## 2022-01-23 NOTE — Patient Instructions (Signed)
Robin Rice , Thank you for taking time to come for your Medicare Wellness Visit. I appreciate your ongoing commitment to your health goals. Please review the following plan we discussed and let me know if I can assist you in the future.   These are the goals we discussed:  Goals      Patient Stated     Lose weight      Track and Manage My Blood Pressure-Hypertension     Timeframe:  Long-Range Goal Priority:  High Start Date:    12/24/20                         Expected End Date:   06/26/21                    Follow Up Date 04/03/21    - check blood pressure weekly - choose a place to take my blood pressure (home, clinic or office, retail store) - write blood pressure results in a log or diary    Why is this important?   You won't feel high blood pressure, but it can still hurt your blood vessels.  High blood pressure can cause heart or kidney problems. It can also cause a stroke.  Making lifestyle changes like losing a little weight or eating less salt will help.  Checking your blood pressure at home and at different times of the day can help to control blood pressure.  If the doctor prescribes medicine remember to take it the way the doctor ordered.  Call the office if you cannot afford the medicine or if there are questions about it.     Notes:         This is a list of the screening recommended for you and due dates:  Health Maintenance  Topic Date Due   Yearly kidney health urinalysis for diabetes  Never done   Eye exam for diabetics  10/27/2018   Flu Shot  12/03/2021   COVID-19 Vaccine (6 - Pfizer series) 01/24/2022   Hemoglobin A1C  02/22/2022   Yearly kidney function blood test for diabetes  11/09/2022   Colon Cancer Screening  12/16/2022   Mammogram  01/25/2023   DEXA scan (bone density measurement)  01/24/2026   Tetanus Vaccine  02/02/2028   Pneumonia Vaccine  Completed   Hepatitis C Screening: USPSTF Recommendation to screen - Ages 50-79 yo.  Completed    Zoster (Shingles) Vaccine  Completed   HPV Vaccine  Aged Out   Complete foot exam   Discontinued    Advanced directives: Advance directive discussed with you today. I have provided a copy for you to complete at home and have notarized. Once this is complete please bring a copy in to our office so we can scan it into your chart.  Conditions/risks identified: none  Next appointment: Follow up in one year for your annual wellness visit    Preventive Care 65 Years and Older, Female Preventive care refers to lifestyle choices and visits with your health care provider that can promote health and wellness. What does preventive care include? A yearly physical exam. This is also called an annual well check. Dental exams once or twice a year. Routine eye exams. Ask your health care provider how often you should have your eyes checked. Personal lifestyle choices, including: Daily care of your teeth and gums. Regular physical activity. Eating a healthy diet. Avoiding tobacco and drug use. Limiting alcohol use. Practicing safe  sex. Taking low-dose aspirin every day. Taking vitamin and mineral supplements as recommended by your health care provider. What happens during an annual well check? The services and screenings done by your health care provider during your annual well check will depend on your age, overall health, lifestyle risk factors, and family history of disease. Counseling  Your health care provider may ask you questions about your: Alcohol use. Tobacco use. Drug use. Emotional well-being. Home and relationship well-being. Sexual activity. Eating habits. History of falls. Memory and ability to understand (cognition). Work and work Statistician. Reproductive health. Screening  You may have the following tests or measurements: Height, weight, and BMI. Blood pressure. Lipid and cholesterol levels. These may be checked every 5 years, or more frequently if you are over 40 years  old. Skin check. Lung cancer screening. You may have this screening every year starting at age 19 if you have a 30-pack-year history of smoking and currently smoke or have quit within the past 15 years. Fecal occult blood test (FOBT) of the stool. You may have this test every year starting at age 49. Flexible sigmoidoscopy or colonoscopy. You may have a sigmoidoscopy every 5 years or a colonoscopy every 10 years starting at age 86. Hepatitis C blood test. Hepatitis B blood test. Sexually transmitted disease (STD) testing. Diabetes screening. This is done by checking your blood sugar (glucose) after you have not eaten for a while (fasting). You may have this done every 1-3 years. Bone density scan. This is done to screen for osteoporosis. You may have this done starting at age 26. Mammogram. This may be done every 1-2 years. Talk to your health care provider about how often you should have regular mammograms. Talk with your health care provider about your test results, treatment options, and if necessary, the need for more tests. Vaccines  Your health care provider may recommend certain vaccines, such as: Influenza vaccine. This is recommended every year. Tetanus, diphtheria, and acellular pertussis (Tdap, Td) vaccine. You may need a Td booster every 10 years. Zoster vaccine. You may need this after age 14. Pneumococcal 13-valent conjugate (PCV13) vaccine. One dose is recommended after age 79. Pneumococcal polysaccharide (PPSV23) vaccine. One dose is recommended after age 75. Talk to your health care provider about which screenings and vaccines you need and how often you need them. This information is not intended to replace advice given to you by your health care provider. Make sure you discuss any questions you have with your health care provider. Document Released: 05/18/2015 Document Revised: 01/09/2016 Document Reviewed: 02/20/2015 Elsevier Interactive Patient Education  2017 [employer] Prevention in the Home Falls can cause injuries. They can happen to people of all ages. There are many things you can do to make your home safe and to help prevent falls. What can I do on the outside of my home? Regularly fix the edges of walkways and driveways and fix any cracks. Remove anything that might make you trip as you walk through a door, such as a raised step or threshold. Trim any bushes or trees on the path to your home. Use bright outdoor lighting. Clear any walking paths of anything that might make someone trip, such as rocks or tools. Regularly check to see if handrails are loose or broken. Make sure that both sides of any steps have handrails. Any raised decks and porches should have guardrails on the edges. Have any leaves, snow, or ice cleared regularly. Use sand or salt on  walking paths during winter. Clean up any spills in your garage right away. This includes oil or grease spills. What can I do in the bathroom? Use night lights. Install grab bars by the toilet and in the tub and shower. Do not use towel bars as grab bars. Use non-skid mats or decals in the tub or shower. If you need to sit down in the shower, use a plastic, non-slip stool. Keep the floor dry. Clean up any water that spills on the floor as soon as it happens. Remove soap buildup in the tub or shower regularly. Attach bath mats securely with double-sided non-slip rug tape. Do not have throw rugs and other things on the floor that can make you trip. What can I do in the bedroom? Use night lights. Make sure that you have a light by your bed that is easy to reach. Do not use any sheets or blankets that are too big for your bed. They should not hang down onto the floor. Have a firm chair that has side arms. You can use this for support while you get dressed. Do not have throw rugs and other things on the floor that can make you trip. What can I do in the kitchen? Clean up any spills right  away. Avoid walking on wet floors. Keep items that you use a lot in easy-to-reach places. If you need to reach something above you, use a strong step stool that has a grab bar. Keep electrical cords out of the way. Do not use floor polish or wax that makes floors slippery. If you must use wax, use non-skid floor wax. Do not have throw rugs and other things on the floor that can make you trip. What can I do with my stairs? Do not leave any items on the stairs. Make sure that there are handrails on both sides of the stairs and use them. Fix handrails that are broken or loose. Make sure that handrails are as long as the stairways. Check any carpeting to make sure that it is firmly attached to the stairs. Fix any carpet that is loose or worn. Avoid having throw rugs at the top or bottom of the stairs. If you do have throw rugs, attach them to the floor with carpet tape. Make sure that you have a light switch at the top of the stairs and the bottom of the stairs. If you do not have them, ask someone to add them for you. What else can I do to help prevent falls? Wear shoes that: Do not have high heels. Have rubber bottoms. Are comfortable and fit you well. Are closed at the toe. Do not wear sandals. If you use a stepladder: Make sure that it is fully opened. Do not climb a closed stepladder. Make sure that both sides of the stepladder are locked into place. Ask someone to hold it for you, if possible. Clearly mark and make sure that you can see: Any grab bars or handrails. First and last steps. Where the edge of each step is. Use tools that help you move around (mobility aids) if they are needed. These include: Canes. Walkers. Scooters. Crutches. Turn on the lights when you go into a dark area. Replace any light bulbs as soon as they burn out. Set up your furniture so you have a clear path. Avoid moving your furniture around. If any of your floors are uneven, fix them. If there are any  pets around you, be aware of where they  are. Review your medicines with your doctor. Some medicines can make you feel dizzy. This can increase your chance of falling. Ask your doctor what other things that you can do to help prevent falls. This information is not intended to replace advice given to you by your health care provider. Make sure you discuss any questions you have with your health care provider. Document Released: 02/15/2009 Document Revised: 09/27/2015 Document Reviewed: 05/26/2014 Elsevier Interactive Patient Education  2017 [employer].

## 2022-01-23 NOTE — Progress Notes (Signed)
Virtual Visit via Telephone Note  I connected with  Robin Rice on 01/23/22 at 11:45 AM EDT by telephone and verified that I am speaking with the correct person using two identifiers.  Medicare Annual Wellness visit completed telephonically due to Covid-19 pandemic.   Persons participating in this call: This Health Coach and this patient.   Location: Patient: home Provider: office    I discussed the limitations, risks, security and privacy concerns of performing an evaluation and management service by telephone and the availability of in person appointments. The patient expressed understanding and agreed to proceed.  Unable to perform video visit due to video visit attempted and failed and/or patient does not have video capability.   Some vital signs may be absent or patient reported.   Willette Brace, LPN   Subjective:   Robin Rice is a 71 y.o. female who presents for Medicare Annual (Subsequent) preventive examination.  Review of Systems     Cardiac Risk Factors include: advanced age (>61mn, >>11women);dyslipidemia;diabetes mellitus;hypertension     Objective:    There were no vitals filed for this visit. There is no height or weight on file to calculate BMI.     01/23/2022   11:33 AM 10/26/2020    8:58 AM 10/25/2018   11:34 AM  Advanced Directives  Does Patient Have a Medical Advance Directive? No Yes Yes  Type of Advance Directive  [place]  Does patient want to make changes to medical advance directive?   Yes (MAU/Ambulatory/Procedural Areas - Information given)  [place]?  No - copy requested   Would patient like information on creating a medical advance directive? Yes (MAU/Ambulatory/Procedural Areas - Information given)      Current Medications (verified) Outpatient Encounter Medications as of 01/23/2022  Medication Sig   amLODipine (NORVASC) 10 MG tablet TAKE 1 TABLET BY MOUTH EVERY DAY    aspirin 81 MG tablet Take 81 mg by mouth daily.   atorvastatin (LIPITOR) 20 MG tablet TAKE 1 TABLET BY MOUTH EVERY DAY   Cholecalciferol (VITAMIN D3) 125 MCG (5000 UT) TABS Take by mouth.   glucose blood (ONETOUCH VERIO) test strip 1 each by Other route once a week. And lancets 1/day   Lancets (ONETOUCH ULTRASOFT) lancets Used to check blood sugars daily. DX CODE E11.9.   levothyroxine (SYNTHROID) 50 MCG tablet TAKE 1 TABLET BY MOUTH EVERY DAY   lisinopril-hydrochlorothiazide (ZESTORETIC) 20-25 MG tablet TAKE 1 TABLET BY MOUTH EVERY DAY   Semaglutide, 2 MG/DOSE, (OZEMPIC, 2 MG/DOSE,) 8 MG/3ML SOPN Inject 2 mg into the skin once a week.   [DISCONTINUED] benzonatate (TESSALON PERLES) 100 MG capsule 1-2 capsules up to twice daily as needed for cough   No facility-administered encounter medications on file as of 01/23/2022.    Allergies (verified) Patient has no known allergies.   History: Past Medical History:  Diagnosis Date   Blood transfusion without reported diagnosis 2000   Cataract 2012   [place]    Fibroadenoma    Glaucoma, Left Eye    Goiter    Heart murmur    Hyperlipidemia    Hypertension    Hypothyroid    Osteoarthritis, knee    Past Surgical History:  Procedure Laterality Date   ABDOMINAL HYSTERECTOMY  2001   ABDOMINOPLASTY     CATARACT EXTRACTION W/ INTRAOCULAR LENS  IMPLANT, BILATERAL     EYE SURGERY  2012   FRACTURE SURGERY  2019   Family History  Problem Relation Age of Onset   Heart disease Mother    Hypertension Father    Prostate cancer Father    Hypertension Brother    Hypertension Brother    Thyroid disease Neg Hx    Diabetes Neg Hx    Social History   Socioeconomic History   Marital status: Married    Spouse name: Not on file   Number of children: Not on file   Years of education: Not on file   Highest education level: Not on file  Occupational History   Occupation: Programmer, systems: [employer]    Occupation: Retired    Fish farm manager: [place] [employer]  Tobacco Use   Smoking status: Never   Smokeless tobacco: Never  Vaping Use   Vaping Use: Never used  Substance and Sexual Activity   Alcohol use: Yes    Alcohol/week: 1.0 standard drink of alcohol    Types: 1 Glasses of wine per week    Comment: Socially   Drug use: No   Sexual activity: Yes    Partners: Male    Birth control/protection: Surgical    Comment: TAH  Other Topics Concern   Not on file  Social History Narrative   Not on file   Social Determinants of Health   Financial Resource Strain: Low Risk  (01/23/2022)   Overall Financial Resource Strain (CARDIA)    Difficulty of Paying Living Expenses: Not hard at all  Food Insecurity: No Food Insecurity (01/23/2022)   Hunger Vital Sign    Worried About Running Out of Food in the Last Year: Never true    Ran Out of Food in the Last Year: Never true  Transportation Needs: No Transportation Needs (01/23/2022)   PRAPARE - Hydrologist (Medical): No    Lack of Transportation (Non-Medical): No  Physical Activity: Sufficiently Active (01/23/2022)   Exercise Vital Sign    Days of Exercise per Week: 4 days    Minutes of Exercise per Session: 90 min  Stress: No Stress Concern Present (01/23/2022)   [place]    Feeling of Stress : Not at all  Social Connections: [place] (01/23/2022)   Social Connection and Isolation Panel [NHANES]    Frequency of Communication with Friends and Family: More than three times a week    Frequency of Social Gatherings with Friends and Family: More than three times a week    Attends Religious Services: More than 4 times per year    Active Member of [employer] or Organizations: Yes    Attends Archivist Meetings: Never    Marital Status: Married    Tobacco Counseling Counseling given: Not Answered   Clinical Intake:  Pre-visit  preparation completed: Yes  Pain : No/denies pain     Nutritional Risks: None Diabetes: Yes CBG done?: No Did pt. bring in CBG monitor from home?: No  How often do you need to have someone help you when you read instructions, pamphlets, or other written materials from your doctor or pharmacy?: 1 - Never  Diabetic?Nutrition Risk Assessment:  Has the patient had any N/V/D within the last 2 months?  No  Does the patient have any non-healing wounds?  No  Has the patient had any unintentional weight loss or weight gain?  No   Diabetes:  Is the patient diabetic?  Yes  If diabetic, was a CBG obtained  today?  No  Did the patient bring in their glucometer from home?  No  How often do you monitor your CBG's? As needed.   Financial Strains and Diabetes Management:  Are you having any financial strains with the device, your supplies or your medication? No .  Does the patient want to be seen by Chronic Care Management for management of their diabetes?  No  Would the patient like to be referred to a Nutritionist or for Diabetic Management?  No   Diabetic Exams:  Diabetic Eye Exam: Overdue for diabetic eye exam. Pt has been advised about the importance in completing this exam. Patient advised to call and schedule an eye exam. Diabetic Foot Exam: Overdue, Pt has been advised about the importance in completing this exam. Pt is scheduled for diabetic foot exam on showing discontinued .   Interpreter Needed?: No  Information entered by :: Charlott Rakes, LPN   Activities of Daily Living    01/23/2022   11:34 AM  In your present state of health, do you have any difficulty performing the following activities:  Hearing? 0  Vision? 0  Difficulty concentrating or making decisions? 0  Walking or climbing stairs? 0  Dressing or bathing? 0  Doing errands, shopping? 0  Preparing Food and eating ? N  Using the Toilet? N  In the past six months, have you accidently leaked urine? N  Do you  have problems with loss of bowel control? N  Managing your Medications? N  Managing your Finances? N  Housekeeping or managing your Housekeeping? N    Patient Care Team: Vivi Barrack, MD as PCP - General (Family Medicine) Salvadore Dom, MD as Consulting Physician (Obstetrics and Gynecology) Renato Shin, MD (Inactive) as Consulting Physician (Endocrinology) Edythe Clarity, [facility] as Pharmacist (Pharmacist)  Indicate any recent Medical Services you may have received from other than [facility] providers in the past year (date may be approximate).     Assessment:   This is a routine wellness examination for Oma.  Hearing/Vision screen Hearing Screening - Comments:: Pt denies any hearing issues  Vision Screening - Comments:: [place] [facility]   Dietary issues and exercise activities discussed: Current Exercise Habits: Home exercise routine, Type of exercise: walking, Time (Minutes): > 60, Frequency (Times/Week): 4, Weekly Exercise (Minutes/Week): 0   Goals Addressed             This Visit's Progress    Patient Stated       Weight loss       Depression Screen    01/23/2022   11:32 AM 11/08/2021    8:45 AM 10/30/2020    2:22 PM 10/26/2020    8:56 AM 10/26/2019    9:49 AM 05/16/2019   10:45 AM 10/25/2018    9:21 AM  PHQ 2/9 Scores  PHQ - 2 Score 0 0 0 0 0 0 0    Fall Risk    01/23/2022   11:33 AM 11/08/2021    8:45 AM 10/30/2020    2:22 PM 10/26/2020    8:59 AM 10/26/2019    9:49 AM  [place] in the past year? 0 0 0 0 0  Number falls in past yr: 0 0 0 0   Injury with Fall? 0 0 0 0   Risk for fall due to : No Fall Risks;Impaired vision No Fall Risks No Fall Risks Impaired vision No Fall Risks  Follow up Falls prevention discussed   Falls prevention discussed  FALL RISK PREVENTION PERTAINING TO THE HOME:  Any stairs in or around the home? No  If so, are there any without handrails? No  Home free of loose throw rugs in walkways, pet beds,  electrical cords, etc? Yes  Adequate lighting in your home to reduce risk of falls? Yes   ASSISTIVE DEVICES UTILIZED TO PREVENT FALLS:  Life alert? No  Use of a cane, walker or w/c? No  Grab bars in the bathroom? Yes  Shower chair or bench in shower? Yes  Elevated toilet seat or a handicapped toilet? No   TIMED UP AND GO:  Was the test performed? No .  Cognitive Function:        01/23/2022   11:35 AM 10/26/2020    9:02 AM  6CIT Screen  What Year? 0 points 0 points  What month? 0 points 0 points  What time? 0 points 0 points  Count back from 20 0 points 0 points  Months in reverse 0 points 4 points  Repeat phrase 2 points 2 points  Total Score 2 points 6 points    Immunizations Immunization History  Administered Date(s) Administered   Fluad Quad(high Dose 65+) 01/06/2019, 01/16/2021   Influenza, High Dose Seasonal PF 02/01/2018   Influenza,inj,Quad PF,6+ Mos 01/06/2017   Influenza-Unspecified 02/15/2015, 01/07/2016, 01/16/2020   PFIZER(Purple Top)SARS-COV-2 Vaccination 06/12/2019, 07/07/2019, 08/27/2020, 01/16/2021   Pfizer Covid-19 Vaccine Bivalent Booster 61yr & up 01/16/2021, 09/23/2021   Pneumococcal Conjugate-13 02/15/2015   Pneumococcal Polysaccharide-23 09/15/2017   Tdap 05/18/2007, 02/01/2018   Zoster Recombinat (Shingrix) 10/31/2017, 02/08/2018   Zoster, Live 10/07/2012    TDAP status: Up to date  Flu Vaccine status: Due, Education has been provided regarding the importance of this vaccine. Advised may receive this vaccine at local pharmacy or Health Dept. Aware to provide a copy of the vaccination record if obtained from local pharmacy or Health Dept. Verbalized acceptance and understanding.  Pneumococcal vaccine status: Up to date  Covid-19 vaccine status: Completed vaccines  Qualifies for Shingles Vaccine? Yes   Zostavax completed Yes   Shingrix Completed?: Yes  Screening Tests Health Maintenance  Topic Date Due   Diabetic kidney evaluation -  Urine ACR  Never done   OPHTHALMOLOGY EXAM  10/27/2018   INFLUENZA VACCINE  12/03/2021   COVID-19 Vaccine (6 - Pfizer series) 01/24/2022   HEMOGLOBIN A1C  02/22/2022   Diabetic kidney evaluation - GFR measurement  11/09/2022   COLONOSCOPY (Pts 45-495yrInsurance coverage will need to be confirmed)  12/16/2022   MAMMOGRAM  01/25/2023   DEXA SCAN  01/24/2026   TETANUS/TDAP  02/02/2028   Pneumonia Vaccine 6513Years old  Completed   Hepatitis C Screening  Completed   Zoster Vaccines- Shingrix  Completed   HPV VACCINES  Aged Out   FOOT EXAM  Discontinued    Health Maintenance  Health Maintenance Due  Topic Date Due   Diabetic kidney evaluation - Urine ACR  Never done   OPHTHALMOLOGY EXAM  10/27/2018   INFLUENZA VACCINE  12/03/2021   COVID-19 Vaccine (6 - Pfizer series) 01/24/2022    Colorectal cancer screening: Type of screening: Colonoscopy. Completed 12/15/12. Repeat every 10 years  Mammogram status: Completed 01/24/21. Repeat every year  Bone Density status: Completed 01/24/21. Results reflect: Bone density results: NORMAL. Repeat every 5 years.   Additional Screening:  Hepatitis C Screening:  Completed 10/20/16  Vision Screening: Recommended annual ophthalmology exams for early detection of glaucoma and other disorders of the eye. Is the patient up to  date with their annual eye exam?  Yes  Who is the provider or what is the name of the office in which the patient attends annual eye exams? [facility] If pt is not established with a provider, would they like to be referred to a provider to establish care? No .   Dental Screening: Recommended annual dental exams for proper oral hygiene  Community Resource Referral / Chronic Care Management: CRR required this visit?  No   CCM required this visit?  No      Plan:     I have personally reviewed and noted the following in the patient's chart:   Medical and social history Use of alcohol, tobacco or illicit drugs   Current medications and supplements including opioid prescriptions. Patient is not currently taking opioid prescriptions. Functional ability and status Nutritional status Physical activity Advanced directives List of other physicians Hospitalizations, surgeries, and ER visits in previous 12 months Vitals Screenings to include cognitive, depression, and falls Referrals and appointments  In addition, I have reviewed and discussed with patient certain preventive protocols, quality metrics, and best practice recommendations. A written personalized care plan for preventive services as well as general preventive health recommendations were provided to patient.     Willette Brace, LPN   [id number]/6811   Nurse Notes: none

## 2022-01-27 ENCOUNTER — Encounter: Payer: Self-pay | Admitting: *Deleted

## 2022-01-27 LAB — HM MAMMOGRAPHY

## 2022-01-30 ENCOUNTER — Encounter: Payer: Self-pay | Admitting: Family Medicine

## 2022-03-01 ENCOUNTER — Other Ambulatory Visit: Payer: Self-pay | Admitting: Family Medicine

## 2022-03-01 DIAGNOSIS — E782 Mixed hyperlipidemia: Secondary | ICD-10-CM

## 2022-03-12 ENCOUNTER — Other Ambulatory Visit: Payer: Self-pay | Admitting: *Deleted

## 2022-03-12 ENCOUNTER — Telehealth: Payer: Self-pay | Admitting: Family Medicine

## 2022-03-12 NOTE — Telephone Encounter (Signed)
OK to fill? Originally not prescribed by you. I do not see it mentioned in last OV notes that you would take over script. Please advise

## 2022-03-12 NOTE — Telephone Encounter (Signed)
  LAST APPOINTMENT DATE:  11/08/21  NEXT APPOINTMENT DATE: 05/12/22  MEDICATION: Semaglutide, 2 MG/DOSE, (OZEMPIC, 2 MG/DOSE,) 8 MG/3ML SOPN   Is the patient out of medication? Will run out in December  PHARM[facility]Y: [id number] [place] [address] [id number]: [phone] Fax: [phone]   Patient states: - She would like a 90 day supply sent in since Insurance only covers a 90 day supply and not a 30 day supply  - PCP informed her that he will take over management of medication since previous prescriber, Dr. ELoanne Drilling has retired

## 2022-03-13 ENCOUNTER — Other Ambulatory Visit: Payer: Self-pay | Admitting: *Deleted

## 2022-03-13 MED ORDER — OZEMPIC (2 MG/DOSE) 8 MG/3ML ~~LOC~~ SOPN: 2.0000 mg | PEN_INJECTOR | SUBCUTANEOUS | 3 refills | Status: DC

## 2022-03-13 NOTE — Telephone Encounter (Signed)
[facility] refill [facility]

## 2022-03-13 NOTE — Telephone Encounter (Signed)
Ok to refill.  Algis Greenhouse. Jerline Pain, MD 03/13/2022 8:15 AM

## 2022-04-11 ENCOUNTER — Other Ambulatory Visit: Payer: Self-pay | Admitting: Family Medicine

## 2022-04-11 DIAGNOSIS — E1159 Type 2 diabetes mellitus with other circulatory complications: Secondary | ICD-10-CM

## 2022-05-12 ENCOUNTER — Ambulatory Visit (INDEPENDENT_AMBULATORY_CARE_PROVIDER_SITE_OTHER): Payer: Medicare HMO | Admitting: Family Medicine

## 2022-05-12 ENCOUNTER — Encounter: Payer: Self-pay | Admitting: Family Medicine

## 2022-05-12 VITALS — BP 125/73 | HR 98 | Temp 97.8°F | Ht 64.0 in | Wt 165.8 lb

## 2022-05-12 DIAGNOSIS — I152 Hypertension secondary to endocrine disorders: Secondary | ICD-10-CM | POA: Diagnosis not present

## 2022-05-12 DIAGNOSIS — E1169 Type 2 diabetes mellitus with other specified complication: Secondary | ICD-10-CM | POA: Diagnosis not present

## 2022-05-12 DIAGNOSIS — E119 Type 2 diabetes mellitus without complications: Secondary | ICD-10-CM

## 2022-05-12 DIAGNOSIS — E785 Hyperlipidemia, unspecified: Secondary | ICD-10-CM

## 2022-05-12 DIAGNOSIS — E1159 Type 2 diabetes mellitus with other circulatory complications: Secondary | ICD-10-CM

## 2022-05-12 LAB — POCT GLYCOSYLATED HEMOGLOBIN (HGB A1C): Hemoglobin A1C: 5.4 % (ref 4.0–5.6)

## 2022-05-12 NOTE — Assessment & Plan Note (Signed)
A1c well-controlled 5.4 on Ozempic 2 mg weekly.  She is doing well.  Minimal side effects.  Will recheck 6 months.

## 2022-05-12 NOTE — Patient Instructions (Signed)
It was very nice to see you today!  Your A1c looks great today.  No medication changes.  Will see back in 6 months for yearly physical.  Come back sooner if needed.  Take care, Dr Jerline Pain  PLEASE NOTE:  If you had any lab tests, please let us know if you have not heard back within a few days. You may see your results on mychart before we have a chance to review them but we will give you a call once they are reviewed by [place].   If we ordered any referrals today, please let us know if you have not heard from their office within the next week.   If you had any urgent prescriptions sent in today, please check with the pharmacy within an hour of our visit to make sure the prescription was transmitted appropriately.   Please try these tips to maintain a healthy lifestyle:  Eat at least 3 REAL meals and 1-2 snacks per day.  Aim for no more than 5 hours between eating.  If you eat breakfast, please do so within one hour of getting up.   Each meal should contain half fruits/vegetables, one quarter protein, and one quarter carbs (no bigger than a computer mouse)  Cut down on sweet beverages. This includes juice, soda, and sweet tea.   Drink at least 1 glass of water with each meal and aim for at least 8 glasses per day  Exercise at least 150 minutes every week.

## 2022-05-12 NOTE — Progress Notes (Signed)
   Robin Rice is a 72 y.o. female who presents today for an office visit.  Assessment/Plan:  Chronic Problems Addressed Today: Controlled type 2 diabetes mellitus without complication, without long-term current use of insulin (HCC) A1c well-controlled 5.4 on Ozempic 2 mg weekly.  She is doing well.  Minimal side effects.  Will recheck 6 months.  Hypertension associated with diabetes ([place]) Blood pressure at goal on amlodipine 10 mg daily and lisinopril-HCTZ 20-25 once daily.  Hyperlipidemia associated with type 2 diabetes mellitus ([place]) Doing well on Lipitor 20 mg daily.  Check labs when she comes back in for CPE in 6 months.     Subjective:  HPI:  See A/P for status of chronic conditions.  Patient is here today for follow-up.  We saw her about 6 months ago for her annual physical.  It was too early to recheck her A1c at that time.  We took over management of her diabetes.  She has done well the last 6 months.       Objective:  Physical Exam: BP 125/73   Pulse 98   Temp 97.8 F (36.6 C) (Temporal)   Ht '5\' 4"'$  (1.626 m)   Wt 165 lb 12.8 oz (75.2 kg)   LMP 05/06/1999   SpO2 100%   BMI 28.46 kg/m   Wt Readings from Last 3 Encounters:  05/12/22 165 lb 12.8 oz (75.2 kg)  01/09/22 165 lb (74.8 kg)  11/08/21 165 lb (74.8 kg)  Gen: No acute distress, resting comfortably CV: Regular rate and rhythm with murmurs appreciated Pulm: Normal work of breathing, clear to auscultation bilaterally with no crackles, wheezes, or rhonchi Neuro: Grossly normal, moves all extremities Psych: Normal affect and thought content      Robin Rice M. Jerline Pain, MD 05/12/2022 9:58 AM

## 2022-05-12 NOTE — Assessment & Plan Note (Signed)
Doing well on Lipitor 20 mg daily.  Check labs when she comes back in for CPE in 6 months.

## 2022-05-12 NOTE — Assessment & Plan Note (Signed)
Blood pressure at goal on amlodipine 10 mg daily and lisinopril-HCTZ 20-25 once daily.

## 2022-06-03 ENCOUNTER — Encounter: Payer: Self-pay | Admitting: Physician Assistant

## 2022-06-03 ENCOUNTER — Ambulatory Visit (INDEPENDENT_AMBULATORY_CARE_PROVIDER_SITE_OTHER): Payer: Medicare HMO | Admitting: Physician Assistant

## 2022-06-03 VITALS — BP 136/70 | HR 95 | Temp 98.0°F | Ht 64.0 in | Wt 168.0 lb

## 2022-06-03 DIAGNOSIS — H9201 Otalgia, right ear: Secondary | ICD-10-CM

## 2022-06-03 MED ORDER — VALACYCLOVIR HCL 1 G PO TABS: 1000.0000 mg | ORAL_TABLET | Freq: Three times a day (TID) | ORAL | 0 refills | Status: AC

## 2022-06-03 NOTE — Progress Notes (Signed)
Robin Rice is a 72 y.o. female here for a new problem.  History of Present Illness:   Chief Complaint  Patient presents with   Otalgia    Pt c/o right ear pain started  on Saturday, now has resolved. Pt also had tingling sensation on right side of face and that has resolved also.    HPI  R ear pain Patient reports that on Saturday had sudden onset sharp intermittent pain in R ear. On Sunday, she developed a tingling sensation to her R cheek and chin area. She did not take anything for her symptoms. As of this morning, all sx resolved.  Denies: fevers, chills, HA, blurred/double vision, n/v/d, slurred speech, confusion, rash/blister, falls, head trauma, balance issues  She has well controlled DM, HTN and hypothyroidism.  She does admit to having a mild cold over the holidays, this resolved.  She has received both doses of shingrix.   Past Medical History:  Diagnosis Date   Blood transfusion without reported diagnosis 2000   Cataract 2012   DM (diabetes mellitus) ([place])    Fibroadenoma    Glaucoma, Left Eye    Goiter    Heart murmur    Hyperlipidemia    Hypertension    Hypothyroid    Osteoarthritis, knee      Social History   Tobacco Use   Smoking status: Never   Smokeless tobacco: Never  Vaping Use   Vaping Use: Never used  Substance Use Topics   Alcohol use: Yes    Alcohol/week: 1.0 standard drink of alcohol    Types: 1 Glasses of wine per week    Comment: Socially   Drug use: No    Past Surgical History:  Procedure Laterality Date   ABDOMINAL HYSTERECTOMY  2001   ABDOMINOPLASTY     CATARACT EXTRACTION W/ INTRAOCULAR LENS  IMPLANT, BILATERAL     EYE SURGERY  2012   FRACTURE SURGERY  2019    Family History  Problem Relation Age of Onset   Heart disease Mother    Hypertension Father    Prostate cancer Father    Hypertension Brother    Hypertension Brother    Thyroid disease Neg Hx    Diabetes Neg Hx     No Known Allergies  Current  Medications:   Current Outpatient Medications:    amLODipine (NORVASC) 10 MG tablet, TAKE 1 TABLET BY MOUTH EVERY DAY, Disp: 90 tablet, Rfl: 1   aspirin 81 MG tablet, Take 81 mg by mouth daily., Disp: , Rfl:    atorvastatin (LIPITOR) 20 MG tablet, TAKE 1 TABLET BY MOUTH EVERY DAY, Disp: 90 tablet, Rfl: 1   Cholecalciferol (VITAMIN D3) 125 MCG (5000 UT) TABS, Take by mouth., Disp: , Rfl:    glucose blood (ONETOUCH VERIO) test strip, 1 each by Other route once a week. And lancets 1/day, Disp: 30 each, Rfl: 1   Lancets (ONETOUCH ULTRASOFT) lancets, Used to check blood sugars daily. DX CODE E11.9., Disp: 100 each, Rfl: 12   levothyroxine (SYNTHROID) 50 MCG tablet, TAKE 1 TABLET BY MOUTH EVERY DAY, Disp: 90 tablet, Rfl: 1   lisinopril-hydrochlorothiazide (ZESTORETIC) 20-25 MG tablet, TAKE 1 TABLET BY MOUTH EVERY DAY, Disp: 90 tablet, Rfl: 1   Semaglutide, 2 MG/DOSE, (OZEMPIC, 2 MG/DOSE,) 8 MG/3ML SOPN, Inject 2 mg into the skin once a week., Disp: 9 mL, Rfl: 3   Review of Systems:   ROS Negative unless otherwise specified per HPI.   Vitals:   Vitals:  06/03/22 1544  BP: 136/70  Pulse: 95  Temp: 98 F (36.7 C)  TempSrc: Temporal  SpO2: 99%  Weight: 168 lb (76.2 kg)  Height: '5\' 4"'$  (1.626 m)     Body mass index is 28.84 kg/m.  Vision Screening   Right eye Left eye Both eyes  Without correction '20/20 20/20 20/20 '$  With correction        Physical Exam:   Physical Exam Vitals and nursing note reviewed.  Constitutional:      General: She is not in acute distress.    Appearance: She is well-developed. She is not ill-appearing or toxic-appearing.  HENT:     Head: Normocephalic and atraumatic.     Right Ear: Tympanic membrane, ear canal and external ear normal. Tympanic membrane is not erythematous, retracted or bulging.     Left Ear: Tympanic membrane, ear canal and external ear normal. Tympanic membrane is not erythematous, retracted or bulging.     Nose: Nose normal.      Right Sinus: No maxillary sinus tenderness or frontal sinus tenderness.     Left Sinus: No maxillary sinus tenderness or frontal sinus tenderness.     Mouth/Throat:     Pharynx: Uvula midline. No posterior oropharyngeal erythema.  Eyes:     General: Lids are normal.     Conjunctiva/sclera: Conjunctivae normal.  Neck:     Trachea: Trachea normal.  Cardiovascular:     Rate and Rhythm: Normal rate and regular rhythm.     Heart sounds: Normal heart sounds, S1 normal and S2 normal.  Pulmonary:     Effort: Pulmonary effort is normal.     Breath sounds: Normal breath sounds. No decreased breath sounds, wheezing, rhonchi or rales.  Lymphadenopathy:     Cervical: No cervical adenopathy.  Skin:    General: Skin is warm and dry.  Neurological:     General: No focal deficit present.     Mental Status: She is alert.     Cranial Nerves: Cranial nerves 2-12 are intact.     Sensory: Sensation is intact.     Motor: Motor function is intact.     Coordination: Coordination is intact.     Gait: Gait is intact.  Psychiatric:        Speech: Speech normal.        Behavior: Behavior normal. Behavior is cooperative.     Assessment and Plan:   Right ear pain No red flags Sx have resolved Suspect possible shingles vs trigeminal neuralgia vs other Neuro exam is benign Discussed treating with valtrex 1 g TID x 7 days to empirically treat for shingles -- she is agreeable If tingling or pain returns, I have asked her to call/message [place] and we can consider medication for trigeminal neuralgia If new/worsening sx, recommend ER eval  Time spent with patient today was 31 minutes which consisted of chart review, discussing diagnosis, work up, treatment answering questions and documentation.   Inda Coke, PA-C

## 2022-06-03 NOTE — Patient Instructions (Signed)
It was great to see you!  I'm treating you for possible shingles. Alternatively, you may have been dealing with Trigeminal Neuralgia -- which is inflammation of the nerve on your face  Start valtrex for  shingles -- I have sent this in  If your pain/tingling returns -- call our office  If any weakness, slurred speech, worst headache of life, vision changes -- go to the ER  Take care,  Inda Coke PA-C

## 2022-09-09 LAB — COMPREHENSIVE METABOLIC PANEL: eGFR: 60

## 2022-09-09 LAB — PROTEIN / CREATININE RATIO, URINE: Albumin, U: 30

## 2022-09-09 LAB — HM DIABETES EYE EXAM

## 2022-09-18 ENCOUNTER — Encounter: Payer: Self-pay | Admitting: Podiatry

## 2022-09-18 ENCOUNTER — Ambulatory Visit (INDEPENDENT_AMBULATORY_CARE_PROVIDER_SITE_OTHER): Payer: Medicare HMO

## 2022-09-18 ENCOUNTER — Ambulatory Visit (INDEPENDENT_AMBULATORY_CARE_PROVIDER_SITE_OTHER): Payer: Medicare HMO | Admitting: Podiatry

## 2022-09-18 DIAGNOSIS — M722 Plantar fascial fibromatosis: Secondary | ICD-10-CM

## 2022-09-18 MED ORDER — TRIAMCINOLONE ACETONIDE 10 MG/ML IJ SUSP
10.0000 mg | Freq: Once | INTRAMUSCULAR | Status: AC
  Administered 2022-09-18: 10 mg

## 2022-09-18 NOTE — Progress Notes (Signed)
Subjective:   Patient ID: Robin Rice, female   DOB: 72 y.o.   MRN: [id number]   HPI Patient presents stating she has had a lot of pain in her left heel and it has been going on for several weeks and she tries to be active and walk.  Does not remember injury and states it is worse when she gets up in the morning after periods of sitting and does not smoke   Review of Systems  All other systems reviewed and are negative.       Objective:  Physical Exam Vitals and nursing note reviewed.  Constitutional:      Appearance: She is well-developed.  Pulmonary:     Effort: Pulmonary effort is normal.  Musculoskeletal:        General: Normal range of motion.  Skin:    General: Skin is warm.  Neurological:     Mental Status: She is alert.     Neurovascular status intact muscle strength adequate range of motion within normal limits with inflammation pain of the plantar heel left with fluid buildup around the medial band moderate flattening of the arch noted bilateral     Assessment:  Acute Planter fasciitis left with inflammation fluid buildup around the medial band     Plan:  H&P reviewed and I went ahead today did x-rays reviewed and then did sterile prep injected the fascia left 3 mg Kenalog 5 Xylocaine applied fascial brace with instructions on usage and advised on support shoes and stretch.  Reappoint to recheck again in the next 2 weeks  X-rays indicate spur no indication of stress fracture arthritis

## 2022-09-18 NOTE — Patient Instructions (Signed)

## 2022-10-02 ENCOUNTER — Encounter: Payer: Self-pay | Admitting: Podiatry

## 2022-10-02 ENCOUNTER — Ambulatory Visit (INDEPENDENT_AMBULATORY_CARE_PROVIDER_SITE_OTHER): Payer: Medicare HMO | Admitting: Podiatry

## 2022-10-02 DIAGNOSIS — M722 Plantar fascial fibromatosis: Secondary | ICD-10-CM | POA: Diagnosis not present

## 2022-10-02 MED ORDER — TRIAMCINOLONE ACETONIDE 10 MG/ML IJ SUSP
10.0000 mg | Freq: Once | INTRAMUSCULAR | Status: AC
  Administered 2022-10-02: 10 mg

## 2022-10-02 NOTE — Progress Notes (Signed)
Subjective:   Patient ID: Robin Rice, female   DOB: 72 y.o.   MRN: [id number]   HPI Patient states she still has a lot of pain in her plantar heel left and states is doing a little bit better but still very sore especially when she gets up in the morning after periods of sitting   ROS      Objective:  Physical Exam  Neurovascular status intact with patient found to have continued exquisite discomfort medial fascial band into the central band left plantar fascia at insertion with a relative tight fascia and equinus     Assessment:  Acute Planter fasciitis left still present with issues with stretching of the arch     Plan:  H&P reviewed and at this point I recommended night splint which was dispensed over-the-counter static device with prefab that was fitted properly to the lower leg with instructions on ice.  Since there is an area of acute inflammation I did do sterile prep and reinjected the fascia 3 mg Kenalog 5 mg Xylocaine and want patient to sleep in the splint at this time

## 2022-10-06 ENCOUNTER — Other Ambulatory Visit: Payer: Self-pay | Admitting: Family Medicine

## 2022-10-22 ENCOUNTER — Telehealth: Payer: Self-pay | Admitting: Family Medicine

## 2022-10-22 NOTE — Telephone Encounter (Signed)
Pt wants to know if she needs to do her labs before her CPE in July. I did not see any orders. Pt would like a call back with details. Please advise.

## 2022-10-22 NOTE — Telephone Encounter (Signed)
We will do lab on appt day

## 2022-10-23 ENCOUNTER — Other Ambulatory Visit: Payer: Self-pay | Admitting: *Deleted

## 2022-10-23 DIAGNOSIS — E038 Other specified hypothyroidism: Secondary | ICD-10-CM

## 2022-10-23 DIAGNOSIS — Z0000 Encounter for general adult medical examination without abnormal findings: Secondary | ICD-10-CM

## 2022-10-23 DIAGNOSIS — E119 Type 2 diabetes mellitus without complications: Secondary | ICD-10-CM

## 2022-10-23 DIAGNOSIS — I152 Hypertension secondary to endocrine disorders: Secondary | ICD-10-CM

## 2022-10-23 DIAGNOSIS — E1169 Type 2 diabetes mellitus with other specified complication: Secondary | ICD-10-CM

## 2022-11-07 ENCOUNTER — Other Ambulatory Visit (INDEPENDENT_AMBULATORY_CARE_PROVIDER_SITE_OTHER): Payer: Medicare HMO

## 2022-11-07 DIAGNOSIS — E119 Type 2 diabetes mellitus without complications: Secondary | ICD-10-CM

## 2022-11-07 DIAGNOSIS — I152 Hypertension secondary to endocrine disorders: Secondary | ICD-10-CM | POA: Diagnosis not present

## 2022-11-07 DIAGNOSIS — E785 Hyperlipidemia, unspecified: Secondary | ICD-10-CM

## 2022-11-07 DIAGNOSIS — E1159 Type 2 diabetes mellitus with other circulatory complications: Secondary | ICD-10-CM

## 2022-11-07 DIAGNOSIS — E038 Other specified hypothyroidism: Secondary | ICD-10-CM

## 2022-11-07 DIAGNOSIS — Z0000 Encounter for general adult medical examination without abnormal findings: Secondary | ICD-10-CM

## 2022-11-07 DIAGNOSIS — E1169 Type 2 diabetes mellitus with other specified complication: Secondary | ICD-10-CM

## 2022-11-07 LAB — COMPREHENSIVE METABOLIC PANEL
ALT: 25 U/L (ref 0–35)
AST: 25 U/L (ref 0–37)
Albumin: 4.1 g/dL (ref 3.5–5.2)
Alkaline Phosphatase: 84 U/L (ref 39–117)
BUN: 19 mg/dL (ref 6–23)
CO2: 30 mEq/L (ref 19–32)
Calcium: 10 mg/dL (ref 8.4–10.5)
Chloride: 102 mEq/L (ref 96–112)
Creatinine, Ser: 0.97 mg/dL (ref 0.40–1.20)
GFR: 59.17 mL/min — ABNORMAL LOW (ref >60.00)
Glucose, Bld: 95 mg/dL (ref 70–99)
Potassium: 4.4 mEq/L (ref 3.5–5.1)
Sodium: 143 mEq/L (ref 135–145)
Total Bilirubin: 0.6 mg/dL (ref 0.2–1.2)
Total Protein: 7.1 g/dL (ref 6.0–8.3)

## 2022-11-07 LAB — CBC
HCT: 39.2 % (ref 36.0–46.0)
Hemoglobin: 12.9 g/dL (ref 12.0–15.0)
MCHC: 32.9 g/dL (ref 30.0–36.0)
MCV: 91.3 fl (ref 78.0–100.0)
Platelets: 196 10*3/uL (ref 150.0–400.0)
RBC: 4.29 Mil/uL (ref 3.87–5.11)
RDW: 13 % (ref 11.5–15.5)
WBC: 8.9 10*3/uL (ref 4.0–10.5)

## 2022-11-07 LAB — LIPID PANEL
Cholesterol: 163 mg/dL (ref 0–200)
HDL: 80.4 mg/dL (ref >39.00)
LDL Cholesterol: 74 mg/dL (ref 0–99)
NonHDL: 83.08
Total CHOL/HDL Ratio: 2
Triglycerides: 46 mg/dL (ref 0.0–149.0)
VLDL: 9.2 mg/dL (ref 0.0–40.0)

## 2022-11-07 LAB — TSH: TSH: 1.13 u[IU]/mL (ref 0.35–5.50)

## 2022-11-07 LAB — HEMOGLOBIN A1C: Hgb A1c MFr Bld: 5.4 % (ref 4.6–6.5)

## 2022-11-08 ENCOUNTER — Other Ambulatory Visit: Payer: Self-pay | Admitting: Family Medicine

## 2022-11-11 ENCOUNTER — Ambulatory Visit (INDEPENDENT_AMBULATORY_CARE_PROVIDER_SITE_OTHER): Payer: Medicare HMO | Admitting: Family Medicine

## 2022-11-11 ENCOUNTER — Encounter: Payer: Self-pay | Admitting: Family Medicine

## 2022-11-11 ENCOUNTER — Other Ambulatory Visit: Payer: Self-pay | Admitting: Family Medicine

## 2022-11-11 VITALS — BP 130/72 | HR 87 | Temp 97.7°F | Ht 64.0 in | Wt 165.6 lb

## 2022-11-11 DIAGNOSIS — E1169 Type 2 diabetes mellitus with other specified complication: Secondary | ICD-10-CM | POA: Diagnosis not present

## 2022-11-11 DIAGNOSIS — E1159 Type 2 diabetes mellitus with other circulatory complications: Secondary | ICD-10-CM

## 2022-11-11 DIAGNOSIS — I152 Hypertension secondary to endocrine disorders: Secondary | ICD-10-CM

## 2022-11-11 DIAGNOSIS — E119 Type 2 diabetes mellitus without complications: Secondary | ICD-10-CM

## 2022-11-11 DIAGNOSIS — E785 Hyperlipidemia, unspecified: Secondary | ICD-10-CM

## 2022-11-11 DIAGNOSIS — E038 Other specified hypothyroidism: Secondary | ICD-10-CM

## 2022-11-11 DIAGNOSIS — Z0001 Encounter for general adult medical examination with abnormal findings: Secondary | ICD-10-CM

## 2022-11-11 DIAGNOSIS — Z1211 Encounter for screening for malignant neoplasm of colon: Secondary | ICD-10-CM

## 2022-11-11 DIAGNOSIS — Z0000 Encounter for general adult medical examination without abnormal findings: Secondary | ICD-10-CM

## 2022-11-11 NOTE — Progress Notes (Signed)
Chief Complaint:  Robin Rice is a 72 y.o. female who presents today for her annual comprehensive physical exam.    Assessment/Plan:  Chronic Problems Addressed Today: Hypothyroid Last TSH at goal on Synthroid 50 mcg daily.  Hypertension associated with diabetes (HCC) Blood pressure at goal today on amlodipine 10 mg daily and lisinopril-HCTZ 20-25 once daily.  Hyperlipidemia associated with type 2 diabetes mellitus (HCC) LDL 74 on Lipitor 20 mg daily.  She is tolerating well.  Controlled type 2 diabetes mellitus without complication, without long-term current use of insulin (HCC) A1c well-controlled 5.4 on Ozempic 2 mg weekly.  Tolerating well without side effects.  She would like to continue with current dose for now.  She will continue working on lifestyle modifications.  Recheck in 6 months.  Preventative Healthcare: Up-to-date on labs and vaccines.  Will check cologuard for cancer screening.  She will be getting mammogram later this year.  Patient Counseling(The following topics were reviewed and/or handout was given):  -Nutrition: Stressed importance of moderation in sodium/caffeine intake, saturated fat and cholesterol, caloric balance, sufficient intake of fresh fruits, vegetables, and fiber.  -Stressed the importance of regular exercise.   -Substance Abuse: Discussed cessation/primary prevention of tobacco, alcohol, or other drug use; driving or other dangerous activities under the influence; availability of treatment for abuse.   -Injury prevention: Discussed safety belts, safety helmets, smoke detector, smoking near bedding or upholstery.   -Sexuality: Discussed sexually transmitted diseases, partner selection, use of condoms, avoidance of unintended pregnancy and contraceptive alternatives.   -Dental health: Discussed importance of regular tooth brushing, flossing, and dental visits.  -Health maintenance and immunizations reviewed. Please refer to Health maintenance  section.  Return to care in 1 year for next preventative visit.     Subjective:  HPI:  She has no acute complaints today. See Assessment / plan for status of chronic conditions.   Lifestyle Diet: Balanced.  Exercise: Trying walk. Going to the [facility] routinely.      11/11/2022   10:39 AM  Depression screen PHQ 2/9  Decreased Interest 0  Down, Depressed, Hopeless 0  PHQ - 2 Score 0    Health Maintenance Due  Topic Date Due   Diabetic kidney evaluation - Urine ACR  Never done   Colonoscopy  12/16/2022     ROS: Per HPI, otherwise a complete review of systems was negative.   PMH:  The following were reviewed and entered/updated in epic: Past Medical History:  Diagnosis Date   Blood transfusion without reported diagnosis 2000   Cataract 2012   DM (diabetes mellitus) (HCC)    Fibroadenoma    Glaucoma, Left Eye    Goiter    Heart murmur    Hyperlipidemia    Hypertension    Hypothyroid    Osteoarthritis, knee    Plantar fasciitis 09/2002   Patient Active Problem List   Diagnosis Date Noted   Multinodular goiter 03/06/2020   Hypothyroid    Osteoarthritis, knee 09/20/2015   Controlled type 2 diabetes mellitus without complication, without long-term current use of insulin (HCC) 09/20/2015   Cardiac murmur 09/20/2015   Hypertension associated with diabetes (HCC)    Hyperlipidemia associated with type 2 diabetes mellitus (HCC)    Glaucoma, left eye    Past Surgical History:  Procedure Laterality Date   ABDOMINAL HYSTERECTOMY  2001   ABDOMINOPLASTY     CATARACT EXTRACTION W/ INTRAOCULAR LENS  IMPLANT, BILATERAL     EYE SURGERY  2012   FRACTURE SURGERY  2019    Family History  Problem Relation Age of Onset   Heart disease Mother    Hypertension Father    Prostate cancer Father    Hypertension Brother    Hypertension Brother    Thyroid disease Neg Hx    Diabetes Neg Hx     Medications- reviewed and updated Current Outpatient Medications  Medication Sig  Dispense Refill   amLODipine (NORVASC) 10 MG tablet TAKE 1 TABLET BY MOUTH EVERY DAY 90 tablet 1   aspirin 81 MG tablet Take 81 mg by mouth daily.     atorvastatin (LIPITOR) 20 MG tablet TAKE 1 TABLET BY MOUTH EVERY DAY 90 tablet 1   Cholecalciferol (VITAMIN D3) 125 MCG (5000 UT) TABS Take by mouth.     glucose blood (ONETOUCH VERIO) test strip 1 each by Other route once a week. And lancets 1/day 30 each 1   Lancets (ONETOUCH ULTRASOFT) lancets Used to check blood sugars daily. DX CODE E11.9. 100 each 12   levothyroxine (SYNTHROID) 50 MCG tablet TAKE 1 TABLET BY MOUTH EVERY DAY 90 tablet 1   lisinopril-hydrochlorothiazide (ZESTORETIC) 20-25 MG tablet TAKE 1 TABLET BY MOUTH EVERY DAY 90 tablet 1   Semaglutide, 2 MG/DOSE, (OZEMPIC, 2 MG/DOSE,) 8 MG/3ML SOPN Inject 2 mg into the skin once a week. 9 mL 3   No current facility-administered medications for this visit.    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[place] [employer]  Tobacco Use   Smoking status: Never   Smokeless tobacco: Never  Vaping Use   Vaping Use: Never used  Substance and Sexual Activity   Alcohol use: Yes    Alcohol/week: 1.0 standard drink of alcohol    Types: 1 Glasses of wine per week    Comment: Socially   Drug use: No   Sexual activity: Yes    Partners: Male    Birth control/protection: Surgical    Comment: TAH  Other Topics Concern   Not on file  Social History Narrative   Not on file   Social Determinants of Health   Financial Resource Strain: Low Risk  (01/23/2022)   Overall Financial Resource Strain (CARDIA)    Difficulty of Paying Living Expenses: Not hard at all   Food Insecurity: No Food Insecurity (01/23/2022)   Hunger Vital Sign    Worried About Running Out of Food in the Last Year: Never true    Ran Out of Food in the Last Year: Never true  Transportation Needs: No Transportation Needs (01/23/2022)   PRAPARE - Administrator, Civil Service (Medical): No    Lack of Transportation (Non-Medical): No  Physical Activity: Sufficiently Active (01/23/2022)   Exercise Vital Sign    Days of Exercise per Week: 4 days    Minutes of Exercise per Session: 90 min  Stress: No Stress Concern Present (01/23/2022)   [employer] of Occupational Health - Occupational Stress Questionnaire    Feeling of Stress : Not at all  Social Connections: Socially Integrated (01/23/2022)   Social Connection and Isolation Panel [NHANES]    Frequency of Communication with Friends  and Family: More than three times a week    Frequency of Social Gatherings with Friends and Family: More than three times a week    Attends Religious Services: More than 4 times per year    Active Member of [employer] or Organizations: Yes    Attends Banker Meetings: Never    Marital Status: Married        Objective:  Physical Exam: BP 130/72   Pulse 87   Temp 97.7 F (36.5 C) (Temporal)   Ht 5\' 4"  (1.626 m)   Wt 165 lb 9.6 oz (75.1 kg)   LMP 05/06/1999   SpO2 100%   BMI 28.43 kg/m   Body mass index is 28.43 kg/m. Wt Readings from Last 3 Encounters:  11/11/22 165 lb 9.6 oz (75.1 kg)  06/03/22 168 lb (76.2 kg)  05/12/22 165 lb 12.8 oz (75.2 kg)   Gen: NAD, resting comfortably HEENT: TMs normal bilaterally. OP clear. No thyromegaly noted.  CV: RRR with no murmurs appreciated Pulm: NWOB, CTAB with no crackles, wheezes, or rhonchi GI: Normal bowel sounds present. Soft, Nontender, Nondistended. MSK: no edema, cyanosis, or clubbing noted Skin: warm, dry Neuro: CN2-12 grossly intact. Strength 5/5 in upper and lower extremities. Reflexes symmetric and intact  bilaterally.  Psych: Normal affect and thought content     Doni Widmer M. Jimmey Ralph, MD 11/11/2022 11:12 AM

## 2022-11-11 NOTE — Assessment & Plan Note (Signed)
Blood pressure at goal today on amlodipine 10 mg daily and lisinopril-HCTZ 20-25 once daily.

## 2022-11-11 NOTE — Assessment & Plan Note (Signed)
LDL 74 on Lipitor 20 mg daily.  She is tolerating well.

## 2022-11-11 NOTE — Patient Instructions (Addendum)
It was very nice to see you today!  Your numbers all look great.  Please continue the great work.  No changes today.  We will order Cologuard for colon cancer screening.  Return in about 6 months (around 05/14/2023) for Follow Up.   Take care, Dr Jimmey Ralph  PLEASE NOTE:  If you had any lab tests, please let us know if you have not heard back within a few days. You may see your results on mychart before we have a chance to review them but we will give you a call once they are reviewed by [place].   If we ordered any referrals today, please let us know if you have not heard from their office within the next week.   If you had any urgent prescriptions sent in today, please check with the pharmacy within an hour of our visit to make sure the prescription was transmitted appropriately.   Please try these tips to maintain a healthy lifestyle:  Eat at least 3 REAL meals and 1-2 snacks per day.  Aim for no more than 5 hours between eating.  If you eat breakfast, please do so within one hour of getting up.   Each meal should contain half fruits/vegetables, one quarter protein, and one quarter carbs (no bigger than a computer mouse)  Cut down on sweet beverages. This includes juice, soda, and sweet tea.   Drink at least 1 glass of water with each meal and aim for at least 8 glasses per day  Exercise at least 150 minutes every week.     Preventive Care 45 Years and Older, Female Preventive care refers to lifestyle choices and visits with your health care provider that can promote health and wellness. Preventive care visits are also called wellness exams. What can I expect for my preventive care visit? Counseling Your health care provider may ask you questions about your: Medical history, including: Past medical problems. Family medical history. Pregnancy and menstrual history. History of falls. Current health, including: Memory and ability to understand (cognition). Emotional well-being. Home  life and relationship well-being. Sexual activity and sexual health. Lifestyle, including: Alcohol, nicotine or tobacco, and drug use. Access to firearms. Diet, exercise, and sleep habits. Work and work Astronomer. Sunscreen use. Safety issues such as seatbelt and bike helmet use. Physical exam Your health care provider will check your: Height and weight. These may be used to calculate your BMI (body mass index). BMI is a measurement that tells if you are at a healthy weight. Waist circumference. This measures the distance around your waistline. This measurement also tells if you are at a healthy weight and may help predict your risk of certain diseases, such as type 2 diabetes and high blood pressure. Heart rate and blood pressure. Body temperature. Skin for abnormal spots. What immunizations do I need?  Vaccines are usually given at various ages, according to a schedule. Your health care provider will recommend vaccines for you based on your age, medical history, and lifestyle or other factors, such as travel or where you work. What tests do I need? Screening Your health care provider may recommend screening tests for certain conditions. This may include: Lipid and cholesterol levels. Hepatitis C test. Hepatitis B test. HIV (human immunodeficiency virus) test. STI (sexually transmitted infection) testing, if you are at risk. Lung cancer screening. Colorectal cancer screening. Diabetes screening. This is done by checking your blood sugar (glucose) after you have not eaten for a while (fasting). Mammogram. Talk with your health care  provider about how often you should have regular mammograms. BRCA-related cancer screening. This may be done if you have a family history of breast, ovarian, tubal, or peritoneal cancers. Bone density scan. This is done to screen for osteoporosis. Talk with your health care provider about your test results, treatment options, and if necessary, the need  for more tests. Follow these instructions at home: Eating and drinking  Eat a diet that includes fresh fruits and vegetables, whole grains, lean protein, and low-fat dairy products. Limit your intake of foods with high amounts of sugar, saturated fats, and salt. Take vitamin and mineral supplements as recommended by your health care provider. Do not drink alcohol if your health care provider tells you not to drink. If you drink alcohol: Limit how much you have to 0-1 drink a day. Know how much alcohol is in your drink. In the [place] one drink equals one 12 oz bottle of beer (355 mL), one 5 oz glass of wine (148 mL), or one 1 oz glass of hard liquor (44 mL). Lifestyle Brush your teeth every morning and night with fluoride toothpaste. Floss one time each day. Exercise for at least 30 minutes 5 or more days each week. Do not use any products that contain nicotine or tobacco. These products include cigarettes, chewing tobacco, and vaping devices, such as e-cigarettes. If you need help quitting, ask your health care provider. Do not use drugs. If you are sexually active, practice safe sex. Use a condom or other form of protection in order to prevent STIs. Take aspirin only as told by your health care provider. Make sure that you understand how much to take and what form to take. Work with your health care provider to find out whether it is safe and beneficial for you to take aspirin daily. Ask your health care provider if you need to take a cholesterol-lowering medicine (statin). Find healthy ways to manage stress, such as: Meditation, yoga, or listening to music. Journaling. Talking to a trusted person. Spending time with friends and family. Minimize exposure to UV radiation to reduce your risk of skin cancer. Safety Always wear your seat belt while driving or riding in a vehicle. Do not drive: If you have been drinking alcohol. Do not ride with someone who has been drinking. When you are  tired or distracted. While texting. If you have been using any mind-altering substances or drugs. Wear a helmet and other protective equipment during sports activities. If you have firearms in your house, make sure you follow all gun safety procedures. What's next? Visit your health care provider once a year for an annual wellness visit. Ask your health care provider how often you should have your eyes and teeth checked. Stay up to date on all vaccines. This information is not intended to replace advice given to you by your health care provider. Make sure you discuss any questions you have with your health care provider. Document Revised: 10/17/2020 Document Reviewed: 10/17/2020 Elsevier Patient Education  2024 [employer].

## 2022-11-11 NOTE — Assessment & Plan Note (Signed)
A1c well-controlled 5.4 on Ozempic 2 mg weekly.  Tolerating well without side effects.  She would like to continue with current dose for now.  She will continue working on lifestyle modifications.  Recheck in 6 months.

## 2022-11-11 NOTE — Assessment & Plan Note (Signed)
Last TSH at goal on Synthroid 50 mcg daily. ?

## 2022-11-26 LAB — COLOGUARD: COLOGUARD: NEGATIVE

## 2022-11-29 ENCOUNTER — Other Ambulatory Visit: Payer: Self-pay | Admitting: Family Medicine

## 2022-11-29 DIAGNOSIS — E782 Mixed hyperlipidemia: Secondary | ICD-10-CM

## 2022-12-01 NOTE — Progress Notes (Signed)
Great news! Cologuard is negative. We can repeat in 3 years.  Robin Rice. Jimmey Ralph, MD 12/01/2022 7:12 AM

## 2022-12-18 ENCOUNTER — Encounter (INDEPENDENT_AMBULATORY_CARE_PROVIDER_SITE_OTHER): Payer: Self-pay

## 2023-01-29 ENCOUNTER — Ambulatory Visit: Payer: Medicare HMO

## 2023-01-29 VITALS — Wt 165.0 lb

## 2023-01-29 DIAGNOSIS — Z0000 Encounter for general adult medical examination without abnormal findings: Secondary | ICD-10-CM

## 2023-01-29 NOTE — Progress Notes (Signed)
Subjective:   Robin Rice is a 72 y.o. female who presents for Medicare Annual (Subsequent) preventive examination.  Visit Complete: Virtual  I connected with  Robin Rice on 01/29/23 by a audio enabled telemedicine application and verified that I am speaking with the correct person using two identifiers.  Patient Location: Home  Provider Location: Office/Clinic  I discussed the limitations of evaluation and management by telemedicine. The patient expressed understanding and agreed to proceed.  Patient Medicare AWV questionnaire was completed by the patient on 01/25/23; I have confirmed that all information answered by patient is correct and no changes since this date.  Because this visit was a virtual/telehealth visit, some criteria may be missing or patient reported. Any vitals not documented were not able to be obtained and vitals that have been documented are patient reported.    Cardiac Risk Factors include: advanced age (>52men, >29 women);diabetes mellitus;dyslipidemia;hypertension     Objective:    Today's Vitals   01/29/23 1130  Weight: 165 lb (74.8 kg)   Body mass index is 28.32 kg/m.     01/29/2023   11:35 AM 01/23/2022   11:33 AM 10/26/2020    8:58 AM 10/25/2018   11:34 AM  Advanced Directives  Does Patient Have a Medical Advance Directive? No No Yes Yes  Type of Advance Directive   Healthcare Power of Attorney   Does patient want to make changes to medical advance directive?    Yes (MAU/Ambulatory/Procedural Areas - Information given)  Copy of Healthcare Power of Attorney in Chart?   No - copy requested   Would patient like information on creating a medical advance directive? No - Patient declined Yes (MAU/Ambulatory/Procedural Areas - Information given)      Current Medications (verified) Outpatient Encounter Medications as of 01/29/2023  Medication Sig   amLODipine (NORVASC) 10 MG tablet TAKE 1 TABLET BY MOUTH EVERY DAY   aspirin 81 MG tablet  Take 81 mg by mouth daily.   atorvastatin (LIPITOR) 20 MG tablet TAKE 1 TABLET BY MOUTH EVERY DAY   Cholecalciferol (VITAMIN D3) 125 MCG (5000 UT) TABS Take by mouth.   glucose blood (ONETOUCH VERIO) test strip 1 each by Other route once a week. And lancets 1/day   Lancets (ONETOUCH ULTRASOFT) lancets Used to check blood sugars daily. DX CODE E11.9.   levothyroxine (SYNTHROID) 50 MCG tablet TAKE 1 TABLET BY MOUTH EVERY DAY   lisinopril-hydrochlorothiazide (ZESTORETIC) 20-25 MG tablet TAKE 1 TABLET BY MOUTH EVERY DAY   Semaglutide, 2 MG/DOSE, (OZEMPIC, 2 MG/DOSE,) 8 MG/3ML SOPN Inject 2 mg into the skin once a week.   No facility-administered encounter medications on file as of 01/29/2023.    Allergies (verified) Patient has no known allergies.   History: Past Medical History:  Diagnosis Date   Blood transfusion without reported diagnosis 2000   Cataract 2012   DM (diabetes mellitus) (HCC)    Fibroadenoma    Glaucoma, Left Eye    Goiter    Heart murmur    Hyperlipidemia    Hypertension    Hypothyroid    Osteoarthritis, knee    Plantar fasciitis 09/2002   Past Surgical History:  Procedure Laterality Date   ABDOMINAL HYSTERECTOMY  2001   ABDOMINOPLASTY     CATARACT EXTRACTION W/ INTRAOCULAR LENS  IMPLANT, BILATERAL     EYE SURGERY  2012   FRACTURE SURGERY  2019   Family History  Problem Relation Age of Onset   Heart disease Mother    Hypertension  Father    Prostate cancer Father    Hypertension Brother    Hypertension Brother    Thyroid disease Neg Hx    Diabetes Neg Hx    Social History   Socioeconomic History   Marital status: Married    Spouse name: Not on file   Number of children: Not on file   Years of education: Not on file   Highest education level: Not on file  Occupational History   Occupation: Patent attorney: [employer]   Occupation: Retired    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[place] [employer]  Tobacco Use   Smoking status: Never   Smokeless  tobacco: Never  Vaping Use   Vaping status: Never Used  Substance and Sexual Activity   Alcohol use: Yes    Alcohol/week: 1.0 standard drink of alcohol    Types: 1 Glasses of wine per week    Comment: Socially   Drug use: No   Sexual activity: Yes    Partners: Male    Birth control/protection: Surgical    Comment: TAH  Other Topics Concern   Not on file  Social History Narrative   Not on file   Social Determinants of Health   Financial Resource Strain: Low Risk  (01/25/2023)   Overall Financial Resource Strain (CARDIA)    Difficulty of Paying Living Expenses: Not hard at all  Food Insecurity: No Food Insecurity (01/25/2023)   Hunger Vital Sign    Worried About Running Out of Food in the Last Year: Never true    Ran Out of Food in the Last Year: Never true  Transportation Needs: No Transportation Needs (01/25/2023)   PRAPARE - Administrator, Civil Service (Medical): No    Lack of Transportation (Non-Medical): No  Physical Activity: Sufficiently Active (01/25/2023)   Exercise Vital Sign    Days of Exercise per Week: 3 days    Minutes of Exercise per Session: 110 min  Stress: No Stress Concern Present (01/25/2023)   [employer] of Occupational Health - Occupational Stress Questionnaire    Feeling of Stress : Not at all  Social Connections: Moderately Integrated (01/25/2023)   Social Connection and Isolation Panel [NHANES]    Frequency of Communication with Friends and Family: More than three times a week    Frequency of Social Gatherings with Friends and Family: Once a week    Attends Religious Services: More than 4 times per year    Active Member of [employer] or Organizations: No    Attends Engineer, structural: Never    Marital Status: Married    Tobacco Counseling Counseling given: Not Answered   Clinical Intake:  Pre-visit preparation completed: Yes  Pain : No/denies pain     BMI - recorded: 28.32 Nutritional Status: BMI 25 -29  Overweight Nutritional Risks: None Diabetes: Yes CBG done?: No CBG resulted in Enter/ Edit results?: No Did pt. bring in CBG monitor from home?: No  How often do you need to have someone help you when you read instructions, pamphlets, or other written materials from your doctor or pharmacy?: 2 - Rarely  Interpreter Needed?: No  Information entered by :: Lanier Ensign, LPN   Activities of Daily Living    01/25/2023   10:17 AM  In your present state of health, do you have any difficulty performing the following activities:  Hearing? 0  Vision? 0  Difficulty concentrating or making decisions? 0  Walking or climbing stairs? 0  Dressing or bathing?  0  Doing errands, shopping? 0  Preparing Food and eating ? N  Using the Toilet? N  In the past six months, have you accidently leaked urine? N  Do you have problems with loss of bowel control? N  Managing your Medications? N  Managing your Finances? N  Housekeeping or managing your Housekeeping? N    Patient Care Team: Ardith Dark, MD as PCP - General (Family Medicine) Romualdo Bolk, MD (Inactive) as Consulting Physician (Obstetrics and Gynecology) Romero Belling, MD (Inactive) as Consulting Physician (Endocrinology) Erroll Luna, [facility] (Inactive) as Pharmacist (Pharmacist)  Indicate any recent Medical Services you may have received from other than [facility] providers in the past year (date may be approximate).     Assessment:   This is a routine wellness examination for March.  Hearing/Vision screen Hearing Screening - Comments:: Pt denies any hearing issues  Vision Screening - Comments:: Pt follows up with [facility] for annual eye exams    Goals Addressed             This Visit's Progress    Patient Stated       Keep weight down        Depression Screen    01/29/2023   11:33 AM 11/11/2022   10:39 AM 06/03/2022    3:46 PM 05/12/2022    9:26 AM 01/23/2022   11:32 AM 11/08/2021    8:45 AM 10/30/2020     2:22 PM  PHQ 2/9 Scores  PHQ - 2 Score 0 0 0 0 0 0 0    Fall Risk    01/25/2023   10:17 AM 11/11/2022   10:39 AM 06/03/2022    3:46 PM 05/12/2022    9:26 AM 01/23/2022   11:33 AM  Fall Risk   Falls in the past year? 0 0 0 0 0  Number falls in past yr: 0 0 0 0 0  Injury with Fall? 0 0 0 0 0  Risk for fall due to : Impaired vision No Fall Risks  No Fall Risks No Fall Risks;Impaired vision  Follow up Falls prevention discussed  Falls evaluation completed  Falls prevention discussed    MEDICARE RISK AT HOME: Medicare Risk at Home Any stairs in or around the home?: No If so, are there any without handrails?: No Home free of loose throw rugs in walkways, pet beds, electrical cords, etc?: No Adequate lighting in your home to reduce risk of falls?: Yes Life alert?: No Use of a cane, walker or w/c?: No Grab bars in the bathroom?: Yes Shower chair or bench in shower?: Yes Elevated toilet seat or a handicapped toilet?: No  TIMED UP AND GO:  Was the test performed?  No    Cognitive Function:        01/29/2023   11:36 AM 01/23/2022   11:35 AM 10/26/2020    9:02 AM  6CIT Screen  What Year? 0 points 0 points 0 points  What month? 0 points 0 points 0 points  What time? 0 points 0 points 0 points  Count back from 20 0 points 0 points 0 points  Months in reverse 0 points 0 points 4 points  Repeat phrase 0 points 2 points 2 points  Total Score 0 points 2 points 6 points    Immunizations Immunization History  Administered Date(s) Administered   Fluad Quad(high Dose 65+) 01/06/2019, 01/16/2021, 03/06/2022   Influenza, High Dose Seasonal PF 02/01/2018, 01/26/2023   Influenza,inj,Quad PF,6+ Mos 01/06/2017  Influenza-Unspecified 02/15/2015, 01/07/2016, 02/01/2018, 01/16/2020   PFIZER Comirnaty(Gray Top)Covid-19 Tri-Sucrose Vaccine 08/27/2020   PFIZER(Purple Top)SARS-COV-2 Vaccination 06/12/2019, 07/07/2019, 08/27/2020, 01/16/2021   Pfizer Covid-19 Vaccine Bivalent Booster 2yrs & up  01/16/2021, 09/23/2021   Pfizer(Comirnaty)Fall Seasonal Vaccine 12 years and older 03/06/2022, 10/27/2022   Pneumococcal Conjugate-13 02/15/2015   Pneumococcal Polysaccharide-23 09/15/2017   Rsv, Bivalent, Protein Subunit Rsvpref,pf Verdis Frederickson) 01/26/2023   Tdap 05/18/2007, 02/01/2018   Zoster Recombinant(Shingrix) 10/31/2017, 02/08/2018   Zoster, Live 10/07/2012    TDAP status: Up to date  Flu Vaccine status: Up to date  Pneumococcal vaccine status: Up to date  Covid-19 vaccine status: Information provided on how to obtain vaccines.   Qualifies for Shingles Vaccine? Yes   Zostavax completed Yes   Shingrix Completed?: Yes  Screening Tests Health Maintenance  Topic Date Due   Colonoscopy  12/16/2022   HEMOGLOBIN A1C  05/10/2023   Diabetic kidney evaluation - Urine ACR  09/09/2023   OPHTHALMOLOGY EXAM  09/09/2023   Diabetic kidney evaluation - eGFR measurement  11/07/2023   MAMMOGRAM  01/28/2024   Medicare Annual Wellness (AWV)  01/29/2024   DEXA SCAN  01/24/2026   DTaP/Tdap/Td (3 - Td or Tdap) 02/02/2028   Pneumonia Vaccine 55+ Years old  Completed   INFLUENZA VACCINE  Completed   COVID-19 Vaccine  Completed   Hepatitis C Screening  Completed   Zoster Vaccines- Shingrix  Completed   HPV VACCINES  Aged Out   FOOT EXAM  Discontinued    Health Maintenance  Health Maintenance Due  Topic Date Due   Colonoscopy  12/16/2022    Colorectal cancer screening: Type of screening: Cologuard. Completed 11/20/22. Repeat every 3 years  Mammogram status: Completed 01/30/22. Repeat every year  Bone Density status: Completed 01/24/21. Results reflect: Bone density results: NORMAL. Repeat every 5 years.  Additional Screening:  Hepatitis C Screening: Completed 10/20/16  Vision Screening: Recommended annual ophthalmology exams for early detection of glaucoma and other disorders of the eye. Is the patient up to date with their annual eye exam?  Yes  Who is the provider or what is the  name of the office in which the patient attends annual eye exams? [facility]  If pt is not established with a provider, would they like to be referred to a provider to establish care? No .   Dental Screening: Recommended annual dental exams for proper oral hygiene  Diabetic Foot Exam: Diabetic Foot Exam: Completed 02/15/21  Community Resource Referral / Chronic Care Management: CRR required this visit?  No   CCM required this visit?  No     Plan:     I have personally reviewed and noted the following in the patient's chart:   Medical and social history Use of alcohol, tobacco or illicit drugs  Current medications and supplements including opioid prescriptions. Patient is not currently taking opioid prescriptions. Functional ability and status Nutritional status Physical activity Advanced directives List of other physicians Hospitalizations, surgeries, and ER visits in previous 12 months Vitals Screenings to include cognitive, depression, and falls Referrals and appointments  In addition, I have reviewed and discussed with patient certain preventive protocols, quality metrics, and best practice recommendations. A written personalized care plan for preventive services as well as general preventive health recommendations were provided to patient.     Marzella Schlein, LPN   6/[id number]/[id number]   After Visit Summary: (MyChart) Due to this being a telephonic visit, the after visit summary with patients personalized plan was offered to patient via MyChart  Nurse Notes: none

## 2023-01-29 NOTE — Patient Instructions (Signed)
Robin Rice , Thank you for taking time to come for your Medicare Wellness Visit. I appreciate your ongoing commitment to your health goals. Please review the following plan we discussed and let me know if I can assist you in the future.   Referrals/Orders/Follow-Ups/Clinician Recommendations: continue to work on losing weight   Aim for 30 minutes of exercise or brisk walking, 6-8 glasses of water, and 5 servings of fruits and vegetables each day. You have an order for:  []   2D Mammogram  []   3D Mammogram  []   Bone Density     Please call for appointment:  [facility] [address] [place], [place] [address] [phone]  [name] [address] [place], [place] [address] [phone]  [facility] [address] [place], [place] [address] [phone]  [facility] [place] [facility] [address] [place], [place] [address] [phone]  [facility] [address] [place], [place] [address] [phone]    Make sure to wear two-piece clothing.  No lotions, powders, or deodorants the day of the appointment. Make sure to bring picture ID and insurance card.  Bring list of medications you are currently taking including any supplements.   Schedule your [place] screening mammogram through MyChart!   Log into your MyChart account.  Go to 'Visit' (or 'Appointments' if on mobile App) --> Schedule an Appointment  Under 'Select a Reason for Visit' choose the Mammogram Screening option.  Complete the pre-visit questions and select the time and place that best fits your schedule.    This is a list of the screening recommended for you and due dates:  Health Maintenance  Topic Date Due   Colon Cancer Screening  12/16/2022   Hemoglobin A1C  05/10/2023   Yearly kidney health urinalysis for diabetes  09/09/2023   Eye exam for diabetics  09/09/2023    Yearly kidney function blood test for diabetes  11/07/2023   Mammogram  01/28/2024   Medicare Annual Wellness Visit  01/29/2024   DEXA scan (bone density measurement)  01/24/2026   DTaP/Tdap/Td vaccine (3 - Td or Tdap) 02/02/2028   Pneumonia Vaccine  Completed   Flu Shot  Completed   COVID-19 Vaccine  Completed   Hepatitis C Screening  Completed   Zoster (Shingles) Vaccine  Completed   HPV Vaccine  Aged Out   Complete foot exam   Discontinued    Advanced directives: (Copy Requested) Please bring a copy of your health care power of attorney and living will to the office to be added to your chart at your convenience.  Next Medicare Annual Wellness Visit scheduled for next year: Yes

## 2023-02-02 LAB — HM MAMMOGRAPHY

## 2023-02-03 ENCOUNTER — Encounter: Payer: Self-pay | Admitting: Family Medicine

## 2023-03-03 ENCOUNTER — Telehealth: Payer: Self-pay | Admitting: Family Medicine

## 2023-03-03 ENCOUNTER — Other Ambulatory Visit: Payer: Self-pay | Admitting: *Deleted

## 2023-03-03 MED ORDER — OZEMPIC (2 MG/DOSE) 8 MG/3ML ~~LOC~~ SOPN: 2.0000 mg | PEN_INJECTOR | SUBCUTANEOUS | 3 refills | Status: DC

## 2023-03-03 NOTE — Telephone Encounter (Signed)
Prescription Request  03/03/2023  LOV: 11/11/2022  What is the name of the medication or equipment? Semaglutide, 2 MG/DOSE, (OZEMPIC, 2 MG/DOSE,) 8 MG/3ML SOPN   Have you contacted your pharmacy to request a refill? Yes   Which pharmacy would you like this sent to?  [id number] [place], [place] - [address] [place] [address] Phone: [phone] Fax: [phone]    Patient notified that their request is being sent to the clinical staff for review and that they should receive a response within 2 business days.   Please advise at Mobile [phone] (mobile)

## 2023-03-03 NOTE — Telephone Encounter (Signed)
Rx send to pharmacy  

## 2023-04-02 ENCOUNTER — Other Ambulatory Visit: Payer: Self-pay | Admitting: Family Medicine

## 2023-05-02 ENCOUNTER — Other Ambulatory Visit: Payer: Self-pay | Admitting: Family Medicine

## 2023-05-06 ENCOUNTER — Other Ambulatory Visit: Payer: Self-pay | Admitting: Family Medicine

## 2023-05-06 DIAGNOSIS — I152 Hypertension secondary to endocrine disorders: Secondary | ICD-10-CM

## 2023-05-14 ENCOUNTER — Ambulatory Visit (INDEPENDENT_AMBULATORY_CARE_PROVIDER_SITE_OTHER): Payer: Medicare HMO | Admitting: Family Medicine

## 2023-05-14 ENCOUNTER — Encounter: Payer: Self-pay | Admitting: Family Medicine

## 2023-05-14 VITALS — BP 132/75 | HR 88 | Temp 97.5°F | Ht 64.0 in | Wt 166.6 lb

## 2023-05-14 DIAGNOSIS — E119 Type 2 diabetes mellitus without complications: Secondary | ICD-10-CM

## 2023-05-14 DIAGNOSIS — I152 Hypertension secondary to endocrine disorders: Secondary | ICD-10-CM

## 2023-05-14 DIAGNOSIS — E1159 Type 2 diabetes mellitus with other circulatory complications: Secondary | ICD-10-CM | POA: Diagnosis not present

## 2023-05-14 LAB — POCT GLYCOSYLATED HEMOGLOBIN (HGB A1C): Hemoglobin A1C: 5.4 % (ref 4.0–5.6)

## 2023-05-14 LAB — MICROALBUMIN / CREATININE URINE RATIO
Creatinine,U: 36.2 mg/dL
Microalb Creat Ratio: 1.9 mg/g (ref 0.0–30.0)
Microalb, Ur: <0.7 mg/dL (ref 0.0–1.9)

## 2023-05-14 NOTE — Patient Instructions (Signed)
 It was very nice to see you today!  Your A1c looks great. Keep up the great work!  No changes today.  Return in about 6 months (around 11/11/2023) for Annual Physical.   Take care, Dr Kennyth  PLEASE NOTE:  If you had any lab tests, please let us  know if you have not heard back within a few days. You may see your results on mychart before we have a chance to review them but we will give you a call once they are reviewed by us .   If we ordered any referrals today, please let us  know if you have not heard from their office within the next week.   If you had any urgent prescriptions sent in today, please check with the pharmacy within an hour of our visit to make sure the prescription was transmitted appropriately.   Please try these tips to maintain a healthy lifestyle:  Eat at least 3 REAL meals and 1-2 snacks per day.  Aim for no more than 5 hours between eating.  If you eat breakfast, please do so within one hour of getting up.   Each meal should contain half fruits/vegetables, one quarter protein, and one quarter carbs (no bigger than a computer mouse)  Cut down on sweet beverages. This includes juice, soda, and sweet tea.   Drink at least 1 glass of water with each meal and aim for at least 8 glasses per day  Exercise at least 150 minutes every week.

## 2023-05-14 NOTE — Assessment & Plan Note (Signed)
 A1c well-controlled at 5.4.  Doing well on Ozempic 2 mg weekly.  She is working on lifestyle interventions.  We did discuss adjusting the dose however she would like to continue with current dose for now.  Will recheck again in 6 months at CPE.

## 2023-05-14 NOTE — Progress Notes (Signed)
   Robin Rice is a 73 y.o. female who presents today for an office visit.  Assessment/Plan:  Chronic Problems Addressed Today: Controlled type 2 diabetes mellitus without complication, without long-term current use of insulin  (HCC) A1c well-controlled at 5.4.  Doing well on Ozempic  2 mg weekly.  She is working on lifestyle interventions.  We did discuss adjusting the dose however she would like to continue with current dose for now.  Will recheck again in 6 months at CPE.  Hypertension associated with diabetes (HCC) Blood pressure is at goal on amlodipine  10 mg daily and lisinopril  HCTZ 20-25 once daily.    Subjective:  HPI:  See Assessment / plan for status of chronic conditions.  Patient here today for 76-month follow-up.  Doing well today.  No acute concerns.  Seen 6 months ago for annual physical.  A1c at that time 5.4 on Ozempic  2 mg weekly.  She has done well with his regimen.  No significant side effects.       Objective:  Physical Exam: BP 132/75   Pulse 88   Temp (!) 97.5 F (36.4 C) (Temporal)   Ht 5' 4 (1.626 m)   Wt 166 lb 9.6 oz (75.6 kg)   LMP 05/06/1999   SpO2 100%   BMI 28.60 kg/m   Wt Readings from Last 3 Encounters:  05/14/23 166 lb 9.6 oz (75.6 kg)  01/29/23 165 lb (74.8 kg)  11/11/22 165 lb 9.6 oz (75.1 kg)  Gen: No acute distress, resting comfortably CV: Regular rate and rhythm with no murmurs appreciated Pulm: Normal work of breathing, clear to auscultation bilaterally with no crackles, wheezes, or rhonchi Neuro: Grossly normal, moves all extremities Psych: Normal affect and thought content      Robin Abbasi M. Kennyth, MD 05/14/2023 10:33 AM

## 2023-05-14 NOTE — Assessment & Plan Note (Signed)
 Blood pressure is at goal on amlodipine 10 mg daily and lisinopril HCTZ 20-25 once daily.

## 2023-05-15 NOTE — Progress Notes (Signed)
 Urine test is normal.  We can recheck in a year.

## 2023-05-27 ENCOUNTER — Other Ambulatory Visit: Payer: Self-pay | Admitting: Family Medicine

## 2023-05-27 DIAGNOSIS — E782 Mixed hyperlipidemia: Secondary | ICD-10-CM

## 2023-06-01 ENCOUNTER — Other Ambulatory Visit: Payer: Self-pay | Admitting: *Deleted

## 2023-06-01 ENCOUNTER — Ambulatory Visit: Payer: Self-pay | Admitting: Family Medicine

## 2023-06-01 MED ORDER — OZEMPIC (2 MG/DOSE) 8 MG/3ML ~~LOC~~ SOPN: 2.0000 mg | PEN_INJECTOR | SUBCUTANEOUS | 1 refills | Status: AC

## 2023-06-01 NOTE — Telephone Encounter (Signed)
Copied from CRM [id number]. Topic: Clinical - Prescription Issue >> Jun 01, 2023 10:15 AM Maxwell Marion wrote: Reason for CRM: Patient is calling because she said the [facility]em[facility] and for insurance purposes, it needs to be a 90 day supply.

## 2023-06-01 NOTE — Telephone Encounter (Signed)
[facility]

## 2023-06-25 ENCOUNTER — Ambulatory Visit: Payer: Medicare HMO | Admitting: Family Medicine

## 2023-09-29 ENCOUNTER — Other Ambulatory Visit: Payer: Self-pay | Admitting: Family Medicine

## 2023-11-08 ENCOUNTER — Other Ambulatory Visit: Payer: Self-pay | Admitting: Family Medicine

## 2023-11-17 ENCOUNTER — Ambulatory Visit: Payer: Medicare HMO | Admitting: Family Medicine

## 2023-11-17 ENCOUNTER — Other Ambulatory Visit: Payer: Self-pay | Admitting: *Deleted

## 2023-11-17 ENCOUNTER — Encounter: Payer: Self-pay | Admitting: Family Medicine

## 2023-11-17 VITALS — BP 114/68 | HR 84 | Temp 97.5°F | Ht 64.0 in | Wt 167.0 lb

## 2023-11-17 DIAGNOSIS — E559 Vitamin D deficiency, unspecified: Secondary | ICD-10-CM

## 2023-11-17 DIAGNOSIS — E785 Hyperlipidemia, unspecified: Secondary | ICD-10-CM | POA: Diagnosis not present

## 2023-11-17 DIAGNOSIS — E1159 Type 2 diabetes mellitus with other circulatory complications: Secondary | ICD-10-CM | POA: Diagnosis not present

## 2023-11-17 DIAGNOSIS — Z0000 Encounter for general adult medical examination without abnormal findings: Secondary | ICD-10-CM | POA: Diagnosis not present

## 2023-11-17 DIAGNOSIS — E1169 Type 2 diabetes mellitus with other specified complication: Secondary | ICD-10-CM

## 2023-11-17 DIAGNOSIS — E119 Type 2 diabetes mellitus without complications: Secondary | ICD-10-CM

## 2023-11-17 DIAGNOSIS — Z0001 Encounter for general adult medical examination with abnormal findings: Secondary | ICD-10-CM

## 2023-11-17 DIAGNOSIS — E038 Other specified hypothyroidism: Secondary | ICD-10-CM

## 2023-11-17 DIAGNOSIS — I152 Hypertension secondary to endocrine disorders: Secondary | ICD-10-CM

## 2023-11-17 LAB — MICROALBUMIN / CREATININE URINE RATIO
Creatinine,U: 36.8 mg/dL
Microalb Creat Ratio: 19.2 mg/g (ref 0.0–30.0)
Microalb, Ur: 0.7 mg/dL (ref 0.0–1.9)

## 2023-11-17 LAB — COMPREHENSIVE METABOLIC PANEL WITH GFR
ALT: 17 U/L (ref 0–35)
AST: 24 U/L (ref 0–37)
Albumin: 4.5 g/dL (ref 3.5–5.2)
Alkaline Phosphatase: 88 U/L (ref 39–117)
BUN: 14 mg/dL (ref 6–23)
CO2: 31 meq/L (ref 19–32)
Calcium: 10.1 mg/dL (ref 8.4–10.5)
Chloride: 100 meq/L (ref 96–112)
Creatinine, Ser: 0.86 mg/dL (ref 0.40–1.20)
GFR: 67.87 mL/min (ref >60.00)
Glucose, Bld: 85 mg/dL (ref 70–99)
Potassium: 3.5 meq/L (ref 3.5–5.1)
Sodium: 140 meq/L (ref 135–145)
Total Bilirubin: 0.8 mg/dL (ref 0.2–1.2)
Total Protein: 8 g/dL (ref 6.0–8.3)

## 2023-11-17 LAB — LIPID PANEL
Cholesterol: 166 mg/dL (ref 0–200)
HDL: 84.4 mg/dL (ref >39.00)
LDL Cholesterol: 62 mg/dL (ref 0–99)
NonHDL: 82.05
Total CHOL/HDL Ratio: 2
Triglycerides: 102 mg/dL (ref 0.0–149.0)
VLDL: 20.4 mg/dL (ref 0.0–40.0)

## 2023-11-17 LAB — CBC
HCT: 39.8 % (ref 36.0–46.0)
Hemoglobin: 13.3 g/dL (ref 12.0–15.0)
MCHC: 33.4 g/dL (ref 30.0–36.0)
MCV: 89 fl (ref 78.0–100.0)
Platelets: 180 K/uL (ref 150.0–400.0)
RBC: 4.47 Mil/uL (ref 3.87–5.11)
RDW: 13 % (ref 11.5–15.5)
WBC: 10.1 K/uL (ref 4.0–10.5)

## 2023-11-17 LAB — VITAMIN D 25 HYDROXY (VIT D DEFICIENCY, FRACTURES): VITD: 53.79 ng/mL (ref 30.00–100.00)

## 2023-11-17 LAB — TSH: TSH: 0.88 u[IU]/mL (ref 0.35–5.50)

## 2023-11-17 LAB — HEMOGLOBIN A1C: Hgb A1c MFr Bld: 5.8 % (ref 4.6–6.5)

## 2023-11-17 MED ORDER — TIRZEPATIDE 15 MG/0.5ML ~~LOC~~ SOAJ: 15.0000 mg | SUBCUTANEOUS | 0 refills | Status: DC

## 2023-11-17 MED ORDER — TIRZEPATIDE 15 MG/0.5ML ~~LOC~~ SOAJ
15.0000 mg | SUBCUTANEOUS | 0 refills | Status: DC
Start: 2023-11-17 — End: 2023-11-17

## 2023-11-17 NOTE — Assessment & Plan Note (Signed)
 Check lipids.  She is on Lipitor 20 mg daily and tolerating well.

## 2023-11-17 NOTE — Assessment & Plan Note (Signed)
 Check vitamin D .  Currently on 5000 IUs daily.

## 2023-11-17 NOTE — Progress Notes (Signed)
 Chief Complaint:  Robin Rice is a 73 y.o. female who presents today for her annual comprehensive physical exam.    Assessment/Plan:  Chronic Problems Addressed Today: Controlled type 2 diabetes mellitus without complication, without long-term current use of insulin  (HCC) Check A1c.  She is on Ozempic  2 mg weekly though interested in trying Mounjaro  to see if this helps her with weight loss as well.  We will switch to Mounjaro  15 mg weekly.  She is aware of potential side effects.  She will follow-up with us  in a few weeks via MyChart and we can adjust medications as needed.   Hypertension associated with diabetes (HCC) At goal today on amlodipine  10 mg daily and lisinopril  HCTZ 20-25 once daily.  Hyperlipidemia associated with type 2 diabetes mellitus (HCC) Check lipids.  She is on Lipitor 20 mg daily and tolerating well.  Hypothyroid Check TSH.  She is on Synthroid  50 mcg daily.  Vitamin D  deficiency Check vitamin D .  Currently on 5000 IUs daily.  Preventative Healthcare: Check labs. UpToDate on vaccines and screenings.   Patient Counseling(The following topics were reviewed and/or handout was given):  -Nutrition: Stressed importance of moderation in sodium/caffeine intake, saturated fat and cholesterol, caloric balance, sufficient intake of fresh fruits, vegetables, and fiber.  -Stressed the importance of regular exercise.   -Substance Abuse: Discussed cessation/primary prevention of tobacco, alcohol, or other drug use; driving or other dangerous activities under the influence; availability of treatment for abuse.   -Injury prevention: Discussed safety belts, safety helmets, smoke detector, smoking near bedding or upholstery.   -Sexuality: Discussed sexually transmitted diseases, partner selection, use of condoms, avoidance of unintended pregnancy and contraceptive alternatives.   -Dental health: Discussed importance of regular tooth brushing, flossing, and dental visits.   -Health maintenance and immunizations reviewed. Please refer to Health maintenance section.  Return to care in 1 year for next preventative visit.     Subjective:  HPI:  She has no acute complaints today. See is here today for her annual physical.   Lifestyle Diet: None specific.  Exercise: Trying to walk routinely.      11/17/2023    9:50 AM  Depression screen PHQ 2/9  Decreased Interest 0  Down, Depressed, Hopeless 0  PHQ - 2 Score 0    Health Maintenance Due  Topic Date Due   Diabetic kidney evaluation - Urine ACR  Never done   OPHTHALMOLOGY EXAM  09/09/2023   Diabetic kidney evaluation - eGFR measurement  11/07/2023   HEMOGLOBIN A1C  11/11/2023     ROS: Per HPI, otherwise a complete review of systems was negative.   PMH:  The following were reviewed and entered/updated in epic: Past Medical History:  Diagnosis Date   Blood transfusion without reported diagnosis 2000   Cataract 2012   DM (diabetes mellitus) (HCC)    Fibroadenoma    Glaucoma, Left Eye    Goiter    Heart murmur    Hyperlipidemia    Hypertension    Hypothyroid    Osteoarthritis, knee    Plantar fasciitis 09/2002   Patient Active Problem List   Diagnosis Date Noted   Vitamin D  deficiency 11/17/2023   Multinodular goiter 03/06/2020   Hypothyroid    Osteoarthritis, knee 09/20/2015   Controlled type 2 diabetes mellitus without complication, without long-term current use of insulin  (HCC) 09/20/2015   Cardiac murmur 09/20/2015   Hypertension associated with diabetes (HCC)    Hyperlipidemia associated with type 2 diabetes mellitus (HCC)  Glaucoma, left eye    Past Surgical History:  Procedure Laterality Date   ABDOMINAL HYSTERECTOMY  2001   ABDOMINOPLASTY     CATARACT EXTRACTION W/ INTRAOCULAR LENS  IMPLANT, BILATERAL     EYE SURGERY  2012   FRACTURE SURGERY  2019    Family History  Problem Relation Age of Onset   Heart disease Mother    Hypertension Father    Prostate cancer  Father    Hypertension Brother    Hypertension Brother    Thyroid  disease Neg Hx    Diabetes Neg Hx     Medications- reviewed and updated Current Outpatient Medications  Medication Sig Dispense Refill   amLODipine  (NORVASC ) 10 MG tablet TAKE 1 TABLET BY MOUTH EVERY DAY 90 tablet 1   aspirin 81 MG tablet Take 81 mg by mouth daily.     atorvastatin  (LIPITOR) 20 MG tablet TAKE 1 TABLET BY MOUTH EVERY DAY 90 tablet 1   Cholecalciferol (VITAMIN D3) 125 MCG (5000 UT) TABS Take by mouth.     glucose blood (ONETOUCH VERIO) test strip 1 each by Other route once a week. And lancets 1/day 30 each 1   Lancets (ONETOUCH ULTRASOFT) lancets Used to check blood sugars daily. DX CODE E11.9. 100 each 12   levothyroxine  (SYNTHROID ) 50 MCG tablet TAKE 1 TABLET BY MOUTH EVERY DAY 90 tablet 1   lisinopril -hydrochlorothiazide  (ZESTORETIC ) 20-25 MG tablet TAKE 1 TABLET BY MOUTH EVERY DAY 90 tablet 1   Semaglutide , 2 MG/DOSE, (OZEMPIC , 2 MG/DOSE,) 8 MG/3ML SOPN Inject into the skin.     tirzepatide  (MOUNJARO ) 15 MG/0.5ML Pen Inject 15 mg into the skin once a week. 2 mL 0   No current facility-administered medications for this visit.    Allergies-reviewed and updated No Known Allergies  Social History   Socioeconomic History   Marital status: Married    Spouse name: Not on file   Number of children: Not on file   Years of education: Not on file   Highest education level: 12th grade  Occupational History   Occupation: Patent attorney: [employer]   Occupation: Retired    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[employer]  Tobacco Use   Smoking status: Never   Smokeless tobacco: Never  Vaping Use   Vaping status: Never Used  Substance and Sexual Activity   Alcohol use: Yes    Alcohol/week: 1.0 standard drink of alcohol    Types: 1 Glasses of wine per week    Comment: Socially   Drug use: No   Sexual activity: Yes    Partners: Male    Birth control/protection: Surgical    Comment: TAH  Other  Topics Concern   Not on file  Social History Narrative   Not on file   Social Drivers of Health   Financial Resource Strain: Low Risk  (05/10/2023)   Overall Financial Resource Strain (CARDIA)    Difficulty of Paying Living Expenses: Not hard at all  Food Insecurity: No Food Insecurity (05/10/2023)   Hunger Vital Sign    Worried About Running Out of Food in the Last Year: Never true    Ran Out of Food in the Last Year: Never true  Transportation Needs: No Transportation Needs (05/10/2023)   PRAPARE - Administrator, Civil Service (Medical): No    Lack of Transportation (Non-Medical): No  Physical Activity: Sufficiently Active (05/10/2023)   Exercise Vital Sign    Days of Exercise per Week: 2 days  Minutes of Exercise per Session: 90 min  Stress: No Stress Concern Present (05/10/2023)   [employer] of Occupational Health - Occupational Stress Questionnaire    Feeling of Stress : Only a little  Social Connections: Socially Integrated (05/10/2023)   Social Connection and Isolation Panel    Frequency of Communication with Friends and Family: More than three times a week    Frequency of Social Gatherings with Friends and Family: Twice a week    Attends Religious Services: More than 4 times per year    Active Member of [employer] or Organizations: Yes    Attends Engineer, structural: More than 4 times per year    Marital Status: Married        Objective:  Physical Exam: BP 114/68   Pulse 84   Temp (!) 97.5 F (36.4 C) (Temporal)   Ht 5' 4 (1.626 m)   Wt 167 lb (75.8 kg)   LMP 05/06/1999   SpO2 100%   BMI 28.67 kg/m   Body mass index is 28.67 kg/m. Wt Readings from Last 3 Encounters:  11/17/23 167 lb (75.8 kg)  05/14/23 166 lb 9.6 oz (75.6 kg)  01/29/23 165 lb (74.8 kg)   Gen: NAD, resting comfortably HEENT: TMs normal bilaterally. OP clear. No thyromegaly noted.  CV: RRR with no murmurs appreciated Pulm: NWOB, CTAB with no crackles, wheezes, or  rhonchi GI: Normal bowel sounds present. Soft, Nontender, Nondistended. MSK: no edema, cyanosis, or clubbing noted Skin: warm, dry Neuro: CN2-12 grossly intact. Strength 5/5 in upper and lower extremities. Reflexes symmetric and intact bilaterally.  Psych: Normal affect and thought content     Robin Hoffman M. Kennyth, MD 11/17/2023 10:30 AM

## 2023-11-17 NOTE — Assessment & Plan Note (Signed)
 Check A1c.  She is on Ozempic  2 mg weekly though interested in trying Mounjaro  to see if this helps her with weight loss as well.  We will switch to Mounjaro  15 mg weekly.  She is aware of potential side effects.  She will follow-up with us  in a few weeks via MyChart and we can adjust medications as needed.

## 2023-11-17 NOTE — Assessment & Plan Note (Signed)
Check TSH.  She is on Synthroid 50 mcg daily.

## 2023-11-17 NOTE — Telephone Encounter (Signed)
 Copied from CRM [id number]. Topic: Clinical - Medication Question >> Nov 17, 2023 10:54 AM Revonda D wrote: Reason for CRM: Pt is requesting to speak with a nurse or cma regarding the medication for the tirzepatide  (MOUNJARO ) 15 MG/0.5ML Pen. Pt stated that she was just prescribed the medication today and it was only filled for a 30 day supply and she would have to pay over $500. Pt stated that in order for her insurance to cover the medication it would need to be a 90 day supply. Pt would like a callback today regarding this concern so she can pick up the medication today.   New Rx # 90 [facility] to [name]

## 2023-11-17 NOTE — Patient Instructions (Signed)
 It was very nice to see you today!  We will check blood work today.  We will switch your Ozempic  to Mounjaro .  Let me know how this is working for you in a few weeks.  Please continue to work on diet and exercise.  Will see you back in 6 months for your follow-up visit.  Come back sooner if needed.  Return in about 6 months (around 05/19/2024) for Follow Up.   Take care, Dr Kennyth  PLEASE NOTE:  If you had any lab tests, please let us  know if you have not heard back within a few days. You may see your results on mychart before we have a chance to review them but we will give you a call once they are reviewed by us .   If we ordered any referrals today, please let us  know if you have not heard from their office within the next week.   If you had any urgent prescriptions sent in today, please check with the pharmacy within an hour of our visit to make sure the prescription was transmitted appropriately.   Please try these tips to maintain a healthy lifestyle:  Eat at least 3 REAL meals and 1-2 snacks per day.  Aim for no more than 5 hours between eating.  If you eat breakfast, please do so within one hour of getting up.   Each meal should contain half fruits/vegetables, one quarter protein, and one quarter carbs (no bigger than a computer mouse)  Cut down on sweet beverages. This includes juice, soda, and sweet tea.   Drink at least 1 glass of water with each meal and aim for at least 8 glasses per day  Exercise at least 150 minutes every week.    Preventive Care 1 Years and Older, Female Preventive care refers to lifestyle choices and visits with your health care provider that can promote health and wellness. Preventive care visits are also called wellness exams. What can I expect for my preventive care visit? Counseling Your health care provider may ask you questions about your: Medical history, including: Past medical problems. Family medical history. Pregnancy and  menstrual history. History of falls. Current health, including: Memory and ability to understand (cognition). Emotional well-being. Home life and relationship well-being. Sexual activity and sexual health. Lifestyle, including: Alcohol, nicotine or tobacco, and drug use. Access to firearms. Diet, exercise, and sleep habits. Work and work Astronomer. Sunscreen use. Safety issues such as seatbelt and bike helmet use. Physical exam Your health care provider will check your: Height and weight. These may be used to calculate your BMI (body mass index). BMI is a measurement that tells if you are at a healthy weight. Waist circumference. This measures the distance around your waistline. This measurement also tells if you are at a healthy weight and may help predict your risk of certain diseases, such as type 2 diabetes and high blood pressure. Heart rate and blood pressure. Body temperature. Skin for abnormal spots. What immunizations do I need?  Vaccines are usually given at various ages, according to a schedule. Your health care provider will recommend vaccines for you based on your age, medical history, and lifestyle or other factors, such as travel or where you work. What tests do I need? Screening Your health care provider may recommend screening tests for certain conditions. This may include: Lipid and cholesterol levels. Hepatitis C test. Hepatitis B test. HIV (human immunodeficiency virus) test. STI (sexually transmitted infection) testing, if you are at risk. Lung cancer  screening. Colorectal cancer screening. Diabetes screening. This is done by checking your blood sugar (glucose) after you have not eaten for a while (fasting). Mammogram. Talk with your health care provider about how often you should have regular mammograms. BRCA-related cancer screening. This may be done if you have a family history of breast, ovarian, tubal, or peritoneal cancers. Bone density scan. This is  done to screen for osteoporosis. Talk with your health care provider about your test results, treatment options, and if necessary, the need for more tests. Follow these instructions at home: Eating and drinking  Eat a diet that includes fresh fruits and vegetables, whole grains, lean protein, and low-fat dairy products. Limit your intake of foods with high amounts of sugar, saturated fats, and salt. Take vitamin and mineral supplements as recommended by your health care provider. Do not drink alcohol if your health care provider tells you not to drink. If you drink alcohol: Limit how much you have to 0-1 drink a day. Know how much alcohol is in your drink. In the [place] one drink equals one 12 oz bottle of beer (355 mL), one 5 oz glass of wine (148 mL), or one 1 oz glass of hard liquor (44 mL). Lifestyle Brush your teeth every morning and night with fluoride toothpaste. Floss one time each day. Exercise for at least 30 minutes 5 or more days each week. Do not use any products that contain nicotine or tobacco. These products include cigarettes, chewing tobacco, and vaping devices, such as e-cigarettes. If you need help quitting, ask your health care provider. Do not use drugs. If you are sexually active, practice safe sex. Use a condom or other form of protection in order to prevent STIs. Take aspirin only as told by your health care provider. Make sure that you understand how much to take and what form to take. Work with your health care provider to find out whether it is safe and beneficial for you to take aspirin daily. Ask your health care provider if you need to take a cholesterol-lowering medicine (statin). Find healthy ways to manage stress, such as: Meditation, yoga, or listening to music. Journaling. Talking to a trusted person. Spending time with friends and family. Minimize exposure to UV radiation to reduce your risk of skin cancer. Safety Always wear your seat belt while driving  or riding in a vehicle. Do not drive: If you have been drinking alcohol. Do not ride with someone who has been drinking. When you are tired or distracted. While texting. If you have been using any mind-altering substances or drugs. Wear a helmet and other protective equipment during sports activities. If you have firearms in your house, make sure you follow all gun safety procedures. What's next? Visit your health care provider once a year for an annual wellness visit. Ask your health care provider how often you should have your eyes and teeth checked. Stay up to date on all vaccines. This information is not intended to replace advice given to you by your health care provider. Make sure you discuss any questions you have with your health care provider. Document Revised: 10/17/2020 Document Reviewed: 10/17/2020 Elsevier Patient Education  2024 [employer].

## 2023-11-17 NOTE — Assessment & Plan Note (Signed)
 At goal today on amlodipine  10 mg daily and lisinopril  HCTZ 20-25 once daily.

## 2023-11-19 ENCOUNTER — Ambulatory Visit: Payer: Self-pay | Admitting: Family Medicine

## 2023-11-19 NOTE — Progress Notes (Signed)
 Great news!  Her labs are all at goal.  Do not need to make any changes to her treatment plan at this time.  I would like to see her back in 3 months to recheck her A1c.  We can recheck  everything in a year.

## 2023-11-20 ENCOUNTER — Other Ambulatory Visit: Payer: Self-pay | Admitting: Family Medicine

## 2023-11-20 DIAGNOSIS — E782 Mixed hyperlipidemia: Secondary | ICD-10-CM

## 2023-12-26 ENCOUNTER — Other Ambulatory Visit: Payer: Self-pay | Admitting: Family Medicine

## 2023-12-27 ENCOUNTER — Other Ambulatory Visit: Payer: Self-pay | Admitting: Family Medicine

## 2023-12-27 DIAGNOSIS — E1159 Type 2 diabetes mellitus with other circulatory complications: Secondary | ICD-10-CM

## 2024-02-02 ENCOUNTER — Ambulatory Visit (INDEPENDENT_AMBULATORY_CARE_PROVIDER_SITE_OTHER): Payer: Medicare HMO

## 2024-02-02 VITALS — Ht 64.0 in | Wt 157.0 lb

## 2024-02-02 DIAGNOSIS — Z0000 Encounter for general adult medical examination without abnormal findings: Secondary | ICD-10-CM | POA: Diagnosis not present

## 2024-02-02 NOTE — Patient Instructions (Signed)
 Robin Rice,  Thank you for taking the time for your Medicare Wellness Visit. I appreciate your continued commitment to your health goals. Please review the care plan we discussed, and feel free to reach out if I can assist you further.  Medicare recommends these wellness visits once per year to help you and your care team stay ahead of potential health issues. These visits are designed to focus on prevention, allowing your provider to concentrate on managing your acute and chronic conditions during your regular appointments.  Please note that Annual Wellness Visits do not include a physical exam. Some assessments may be limited, especially if the visit was conducted virtually. If needed, we may recommend a separate in-person follow-up with your provider.  Ongoing Care Seeing your primary care provider every 3 to 6 months helps us  monitor your health and provide consistent, personalized care.   Referrals If a referral was made during today's visit and you haven't received any updates within two weeks, please contact the referred provider directly to check on the status.  Recommended Screenings:  Health Maintenance  Topic Date Due   Eye exam for diabetics  09/09/2023   Flu Shot  12/04/2023   Hemoglobin A1C  05/19/2024   Yearly kidney function blood test for diabetes  11/16/2024   Yearly kidney health urinalysis for diabetes  11/16/2024   Breast Cancer Screening  02/01/2025   Medicare Annual Wellness Visit  02/01/2025   Cologuard (Stool DNA test)  11/19/2025   DEXA scan (bone density measurement)  01/24/2026   DTaP/Tdap/Td vaccine (3 - Td or Tdap) 02/02/2028   Pneumococcal Vaccine for age over 61  Completed   COVID-19 Vaccine  Completed   Hepatitis C Screening  Completed   Zoster (Shingles) Vaccine  Completed   HPV Vaccine  Aged Out   Meningitis B Vaccine  Aged Out   Complete foot exam   Discontinued   Colon Cancer Screening  Discontinued       01/29/2023   11:35 AM  Advanced  Directives  Does Patient Have a Medical Advance Directive? No  Would patient like information on creating a medical advance directive? No - Patient declined   Advance Care Planning is important because it: Ensures you receive medical care that aligns with your values, goals, and preferences. Provides guidance to your family and loved ones, reducing the emotional burden of decision-making during critical moments.  Vision: Annual vision screenings are recommended for early detection of glaucoma, cataracts, and diabetic retinopathy. These exams can also reveal signs of chronic conditions such as diabetes and high blood pressure.  Dental: Annual dental screenings help detect early signs of oral cancer, gum disease, and other conditions linked to overall health, including heart disease and diabetes.  Please see the attached documents for additional preventive care recommendations.

## 2024-02-02 NOTE — Progress Notes (Signed)
 Subjective:   Robin Rice is a 73 y.o. who presents for a Medicare Wellness preventive visit.  As a reminder, Annual Wellness Visits don't include a physical exam, and some assessments may be limited, especially if this visit is performed virtually. We may recommend an in-person follow-up visit with your provider if needed.  Visit Complete: Virtual I connected with  Robin Rice on 02/02/24 by a video and audio enabled telemedicine application and verified that I am speaking with the correct person using two identifiers.  Patient Location: Home  Provider Location: Office/Clinic  I discussed the limitations of evaluation and management by telemedicine. The patient expressed understanding and agreed to proceed.  Vital Signs: Because this visit was a virtual/telehealth visit, some criteria may be missing or patient reported. Any vitals not documented were not able to be obtained and vitals that have been documented are patient reported.    Persons Participating in Visit: Patient.  AWV Questionnaire: Yes: Patient Medicare AWV questionnaire was completed by the patient on 01/28/24; I have confirmed that all information answered by patient is correct and no changes since this date.  Cardiac Risk Factors include: advanced age (>81men, >41 women);dyslipidemia;hypertension;diabetes mellitus     Objective:    Today's Vitals   02/02/24 0912  Weight: 157 lb (71.2 kg)  Height: 5' 4 (1.626 m)   Body mass index is 26.95 kg/m.     02/02/2024    9:17 AM 01/29/2023   11:35 AM 01/23/2022   11:33 AM 10/26/2020    8:58 AM 10/25/2018   11:34 AM  Advanced Directives  Does Patient Have a Medical Advance Directive? No No No Yes Yes  Type of Advance Directive    Healthcare Power of Attorney   Does patient want to make changes to medical advance directive?     Yes (MAU/Ambulatory/Procedural Areas - Information given)   Copy of Healthcare Power of Attorney in Chart?    No - copy requested    Would patient like information on creating a medical advance directive? No - Patient declined No - Patient declined Yes (MAU/Ambulatory/Procedural Areas - Information given)       Data saved with a previous flowsheet row definition    Current Medications (verified) Outpatient Encounter Medications as of 02/02/2024  Medication Sig   amLODipine  (NORVASC ) 10 MG tablet TAKE 1 TABLET BY MOUTH EVERY DAY   aspirin 81 MG tablet Take 81 mg by mouth daily.   atorvastatin  (LIPITOR) 20 MG tablet TAKE 1 TABLET BY MOUTH EVERY DAY   Cholecalciferol (VITAMIN D3) 125 MCG (5000 UT) TABS Take by mouth.   glucose blood (ONETOUCH VERIO) test strip 1 each by Other route once a week. And lancets 1/day   Lancets (ONETOUCH ULTRASOFT) lancets Used to check blood sugars daily. DX CODE E11.9.   levothyroxine  (SYNTHROID ) 50 MCG tablet TAKE 1 TABLET BY MOUTH EVERY DAY   lisinopril -hydrochlorothiazide  (ZESTORETIC ) 20-25 MG tablet TAKE 1 TABLET BY MOUTH EVERY DAY   tirzepatide  (MOUNJARO ) 15 MG/0.5ML Pen INJECT 15MG  DOSE UNDER THE SKIN ONE TIME PER WEEK   OZEMPIC , 2 MG/DOSE, 8 MG/3ML SOPN  (Patient not taking: Reported on 02/02/2024)   No facility-administered encounter medications on file as of 02/02/2024.    Allergies (verified) Patient has no known allergies.   History: Past Medical History:  Diagnosis Date   Blood transfusion without reported diagnosis 2000   Cataract 2012   DM (diabetes mellitus) (HCC)    Fibroadenoma    Glaucoma, Left Eye  Goiter    Heart murmur    Hyperlipidemia    Hypertension    Hypothyroid    Osteoarthritis, knee    Plantar fasciitis 09/2002   Past Surgical History:  Procedure Laterality Date   ABDOMINAL HYSTERECTOMY  2001   ABDOMINOPLASTY     CATARACT EXTRACTION W/ INTRAOCULAR LENS  IMPLANT, BILATERAL     EYE SURGERY  2012   FRACTURE SURGERY  2019   Family History  Problem Relation Age of Onset   Heart disease Mother    Hypertension Father    Prostate cancer Father     Hypertension Brother    Hypertension Brother    Thyroid  disease Neg Hx    Diabetes Neg Hx    Social History   Socioeconomic History   Marital status: Married    Spouse name: Not on file   Number of children: Not on file   Years of education: Not on file   Highest education level: 12th grade  Occupational History   Occupation: Patent attorney: [employer]   Occupation: Retired    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[employer]  Tobacco Use   Smoking status: Never   Smokeless tobacco: Never  Vaping Use   Vaping status: Never Used  Substance and Sexual Activity   Alcohol use: Yes    Alcohol/week: 1.0 standard drink of alcohol    Types: 1 Glasses of wine per week    Comment: Socially   Drug use: No   Sexual activity: Yes    Partners: Male    Birth control/protection: Surgical    Comment: TAH  Other Topics Concern   Not on file  Social History Narrative   Not on file   Social Drivers of Health   Financial Resource Strain: Low Risk  (01/28/2024)   Overall Financial Resource Strain (CARDIA)    Difficulty of Paying Living Expenses: Not hard at all  Food Insecurity: No Food Insecurity (01/28/2024)   Hunger Vital Sign    Worried About Running Out of Food in the Last Year: Never true    Ran Out of Food in the Last Year: Never true  Transportation Needs: No Transportation Needs (01/28/2024)   PRAPARE - Administrator, Civil Service (Medical): No    Lack of Transportation (Non-Medical): No  Physical Activity: Sufficiently Active (01/28/2024)   Exercise Vital Sign    Days of Exercise per Week: 3 days    Minutes of Exercise per Session: 60 min  Stress: No Stress Concern Present (01/28/2024)   [employer] of Occupational Health - Occupational Stress Questionnaire    Feeling of Stress: Not at all  Social Connections: Moderately Integrated (01/28/2024)   Social Connection and Isolation Panel    Frequency of Communication with Friends and Family: More than three  times a week    Frequency of Social Gatherings with Friends and Family: Once a week    Attends Religious Services: More than 4 times per year    Active Member of [employer] or Organizations: No    Attends Engineer, structural: Not on file    Marital Status: Married    Tobacco Counseling Counseling given: Not Answered    Clinical Intake:  Pre-visit preparation completed: Yes  Pain : No/denies pain     BMI - recorded: 26.95 Nutritional Status: BMI 25 -29 Overweight Nutritional Risks: None Diabetes: Yes CBG done?: No Did pt. bring in CBG monitor from home?: No  Lab Results  Component Value Date  HGBA1C 5.8 11/17/2023   HGBA1C 5.4 05/14/2023   HGBA1C 5.4 11/07/2022     How often do you need to have someone help you when you read instructions, pamphlets, or other written materials from your doctor or pharmacy?: 1 - Never  Interpreter Needed?: No  Information entered by :: Ellouise Haws, LPN   Activities of Daily Living     01/28/2024    7:49 AM  In your present state of health, do you have any difficulty performing the following activities:  Hearing? 0  Vision? 0  Difficulty concentrating or making decisions? 0  Walking or climbing stairs? 0  Dressing or bathing? 0  Doing errands, shopping? 0  Preparing Food and eating ? N  Using the Toilet? N  In the past six months, have you accidently leaked urine? N  Do you have problems with loss of bowel control? N  Managing your Medications? N  Managing your Finances? N  Housekeeping or managing your Housekeeping? N    Patient Care Team: Kennyth Worth HERO, MD as PCP - General (Family Medicine) Jannis Kate Norris, MD as Consulting Physician (Obstetrics and Gynecology) Kassie Mallick, MD (Inactive) as Consulting Physician (Endocrinology) Nicholaus Sherlean CROME, [facility] (Inactive) as Pharmacist (Pharmacist)  I have updated your Care Teams any recent Medical Services you may have received from other providers in the past  year.     Assessment:   This is a routine wellness examination for Robin Rice.  Hearing/Vision screen Hearing Screening - Comments:: Pt denies any hearing issues  Vision Screening - Comments:: Wears rx glasses - not up to date with routine eye exams with [facility] [place]    Goals Addressed             This Visit's Progress    Patient Stated       Maintain health and activity        Depression Screen     02/02/2024    9:17 AM 11/17/2023    9:50 AM 05/14/2023   10:05 AM 01/29/2023   11:33 AM 11/11/2022   10:39 AM 06/03/2022    3:46 PM 05/12/2022    9:26 AM  PHQ 2/9 Scores  PHQ - 2 Score 0 0 0 0 0 0 0    Fall Risk     01/28/2024    7:49 AM 11/17/2023    9:50 AM 05/14/2023   10:05 AM 01/25/2023   10:17 AM 11/11/2022   10:39 AM  Fall Risk   Falls in the past year? 0 0 0 0 0  Number falls in past yr: 0 0 0 0 0  Injury with Fall? 0 0 0 0 0  Risk for fall due to : No Fall Risks No Fall Risks No Fall Risks Impaired vision No Fall Risks  Follow up Falls prevention discussed   Falls prevention discussed     MEDICARE RISK AT HOME:  Medicare Risk at Home Any stairs in or around the home?: (Patient-Rptd) No If so, are there any without handrails?: (Patient-Rptd) No Home free of loose throw rugs in walkways, pet beds, electrical cords, etc?: (Patient-Rptd) Yes Adequate lighting in your home to reduce risk of falls?: (Patient-Rptd) Yes Life alert?: (Patient-Rptd) No Use of a cane, walker or w/c?: (Patient-Rptd) No Grab bars in the bathroom?: (Patient-Rptd) Yes Shower chair or bench in shower?: (Patient-Rptd) Yes Elevated toilet seat or a handicapped toilet?: (Patient-Rptd) No  TIMED UP AND GO:  Was the test performed?  No  Cognitive Function:  6CIT completed        02/02/2024    9:18 AM 01/29/2023   11:36 AM 01/23/2022   11:35 AM 10/26/2020    9:02 AM  6CIT Screen  What Year? 0 points 0 points 0 points 0 points  What month? 0 points 0 points 0 points 0 points   What time? 0 points 0 points 0 points 0 points  Count back from 20 0 points 0 points 0 points 0 points  Months in reverse 0 points 0 points 0 points 4 points  Repeat phrase 0 points 0 points 2 points 2 points  Total Score 0 points 0 points 2 points 6 points    Immunizations Immunization History  Administered Date(s) Administered    sv, Bivalent, Protein Subunit Rsvpref,pf (Abrysvo) 01/26/2023   Fluad Quad(high Dose 65+) 01/06/2019, 01/16/2021, 03/06/2022   INFLUENZA, HIGH DOSE SEASONAL PF 02/01/2018, 01/26/2023   Influenza,inj,Quad PF,6+ Mos 01/06/2017   Influenza-Unspecified 02/15/2015, 01/07/2016, 02/01/2018, 01/16/2020   Moderna Covid-19 Fall Seasonal Vaccine 35yrs & older 10/01/2023   PFIZER Comirnaty(Gray Top)Covid-19 Tri-Sucrose Vaccine 08/27/2020   PFIZER(Purple Top)SARS-COV-2 Vaccination 06/12/2019, 07/07/2019, 08/27/2020, 01/16/2021   Pfizer Covid-19 Vaccine Bivalent Booster 27yrs & up 01/16/2021, 09/23/2021   Pfizer(Comirnaty)Fall Seasonal Vaccine 12 years and older 03/06/2022, 10/27/2022, 02/09/2023   Pneumococcal Conjugate-13 02/15/2015   Pneumococcal Polysaccharide-23 09/15/2017   Tdap 05/18/2007, 02/01/2018   Zoster Recombinant(Shingrix) 10/31/2017, 02/08/2018   Zoster, Live 10/07/2012    Screening Tests Health Maintenance  Topic Date Due   OPHTHALMOLOGY EXAM  09/09/2023   Influenza Vaccine  12/04/2023   HEMOGLOBIN A1C  05/19/2024   Diabetic kidney evaluation - eGFR measurement  11/16/2024   Diabetic kidney evaluation - Urine ACR  11/16/2024   Mammogram  02/01/2025   Medicare Annual Wellness (AWV)  02/01/2025   Fecal DNA (Cologuard)  11/19/2025   DEXA SCAN  01/24/2026   DTaP/Tdap/Td (3 - Td or Tdap) 02/02/2028   Pneumococcal Vaccine: 50+ Years  Completed   COVID-19 Vaccine  Completed   Hepatitis C Screening  Completed   Zoster Vaccines- Shingrix  Completed   HPV VACCINES  Aged Out   Meningococcal B Vaccine  Aged Out   FOOT EXAM  Discontinued    Colonoscopy  Discontinued    Health Maintenance Items Addressed: See Nurse Notes at the end of this note  Additional Screening:  Vision Screening: Recommended annual ophthalmology exams for early detection of glaucoma and other disorders of the eye. Is the patient up to date with their annual eye exam?  No  Who is the provider or what is the name of the office in which the patient attends annual eye exams? [facility]   Dental Screening: Recommended annual dental exams for proper oral hygiene  Community Resource Referral / Chronic Care Management: CRR required this visit?  No   CCM required this visit?  No   Plan:    I have personally reviewed and noted the following in the patient's chart:   Medical and social history Use of alcohol, tobacco or illicit drugs  Current medications and supplements including opioid prescriptions. Patient is not currently taking opioid prescriptions. Functional ability and status Nutritional status Physical activity Advanced directives List of other physicians Hospitalizations, surgeries, and ER visits in previous 12 months Vitals Screenings to include cognitive, depression, and falls Referrals and appointments  In addition, I have reviewed and discussed with patient certain preventive protocols, quality metrics, and best practice recommendations. A written personalized care plan for preventive services as well  as general preventive health recommendations were provided to patient.   Ellouise VEAR Haws, LPN   [id number]   After Visit Summary: (MyChart) Due to this being a telephonic visit, the after visit summary with patients personalized plan was offered to patient via MyChart   Notes: Nothing significant to report at this time.

## 2024-02-04 LAB — HM MAMMOGRAPHY

## 2024-02-05 ENCOUNTER — Encounter: Payer: Self-pay | Admitting: Family Medicine

## 2024-03-30 ENCOUNTER — Other Ambulatory Visit: Payer: Self-pay | Admitting: Family Medicine

## 2024-04-08 ENCOUNTER — Other Ambulatory Visit: Payer: Self-pay | Admitting: Family Medicine

## 2024-04-08 DIAGNOSIS — E1159 Type 2 diabetes mellitus with other circulatory complications: Secondary | ICD-10-CM

## 2024-04-30 ENCOUNTER — Other Ambulatory Visit: Payer: Self-pay | Admitting: Family Medicine

## 2024-05-08 LAB — HEMOGLOBIN A1C: Hemoglobin A1C: 5

## 2024-05-20 ENCOUNTER — Encounter: Payer: Self-pay | Admitting: Family Medicine

## 2024-05-20 ENCOUNTER — Ambulatory Visit: Admitting: Family Medicine

## 2024-05-20 VITALS — BP 120/68 | HR 86 | Temp 97.4°F | Ht 64.0 in | Wt 155.0 lb

## 2024-05-20 DIAGNOSIS — E1159 Type 2 diabetes mellitus with other circulatory complications: Secondary | ICD-10-CM | POA: Diagnosis not present

## 2024-05-20 DIAGNOSIS — M21612 Bunion of left foot: Secondary | ICD-10-CM

## 2024-05-20 DIAGNOSIS — I152 Hypertension secondary to endocrine disorders: Secondary | ICD-10-CM | POA: Diagnosis not present

## 2024-05-20 DIAGNOSIS — Z7985 Long-term (current) use of injectable non-insulin antidiabetic drugs: Secondary | ICD-10-CM | POA: Diagnosis not present

## 2024-05-20 DIAGNOSIS — E119 Type 2 diabetes mellitus without complications: Secondary | ICD-10-CM

## 2024-05-20 MED ORDER — MOUNJARO 15 MG/0.5ML ~~LOC~~ SOAJ: 15.0000 mg | SUBCUTANEOUS | 3 refills | Status: AC

## 2024-05-20 NOTE — Assessment & Plan Note (Signed)
 She had A1c checked by her insurance since our last visit which was well-controlled at 5.0.  She is doing very well with Mounjaro  15 mg weekly.  She is down about 12 pounds since her last visit.  We discussed lifestyle modifications.  Will continue current dose Mounjaro  for now.  Recheck A1c in 6 months at her CPE.

## 2024-05-20 NOTE — Assessment & Plan Note (Signed)
 Patient with small bunion noted on left foot.  Occasionally gets some burning and irritation distal of this which is likely due to compressive neuropathy.  No red flags or other abnormalities on exam.  Overall symptoms are manageable.  We did discuss referral to podiatry for further management however she declined.  We discussed reasons to return to care.  She will let us  know if she changes her mind about referral.

## 2024-05-20 NOTE — Patient Instructions (Signed)
 It was very nice to see you today!  VISIT SUMMARY: Today, we discussed the burning sensation in your right big toe and your overall diabetes management. Your A1c level is excellent, and your transition to Mounjaro  has been beneficial.  YOUR PLAN: BUNION AND BURNING SENSATION IN RIGHT BIG TOE: The burning sensation in your right big toe is likely due to nerve irritation from the bunion, with arthritis and nerve compression as possible causes. -Monitor your symptoms and avoid wearing shoes that aggravate the burning sensation. -If the symptoms worsen, we may consider a referral to a podiatrist.  TYPE 2 DIABETES MELLITUS: Your diabetes is well controlled with an A1c of 5.0. The transition to Mounjaro  has helped you lose 12 pounds. -Continue taking Mounjaro  as prescribed. -Monitor your weight and ensure regular A1c monitoring. -Address the issue with your pharmacy to ensure you receive a 90-day supply of Mounjaro  as prescribed.  Return in about 6 months (around 11/17/2024) for Annual Physical.   Take care, Dr Kennyth  PLEASE NOTE:  If you had any lab tests, please let us  know if you have not heard back within a few days. You may see your results on mychart before we have a chance to review them but we will give you a call once they are reviewed by us .   If we ordered any referrals today, please let us  know if you have not heard from their office within the next week.   If you had any urgent prescriptions sent in today, please check with the pharmacy within an hour of our visit to make sure the prescription was transmitted appropriately.   Please try these tips to maintain a healthy lifestyle:  Eat at least 3 REAL meals and 1-2 snacks per day.  Aim for no more than 5 hours between eating.  If you eat breakfast, please do so within one hour of getting up.   Each meal should contain half fruits/vegetables, one quarter protein, and one quarter carbs (no bigger than a computer mouse)  Cut down  on sweet beverages. This includes juice, soda, and sweet tea.   Drink at least 1 glass of water with each meal and aim for at least 8 glasses per day  Exercise at least 150 minutes every week.

## 2024-05-20 NOTE — Assessment & Plan Note (Signed)
Blood pressure at goal today on amlodipine 10 mg daily and lisinopril-HCTZ 20-25 once daily.

## 2024-05-20 NOTE — Progress Notes (Signed)
 "  MAKELA NIEHOFF is a 74 y.o. female who presents today for an office visit.  Assessment/Plan:    Chronic Problems Addressed Today: Controlled type 2 diabetes mellitus without complication, without long-term current use of insulin  (HCC) She had A1c checked by her insurance since our last visit which was well-controlled at 5.0.  She is doing very well with Mounjaro  15 mg weekly.  She is down about 12 pounds since her last visit.  We discussed lifestyle modifications.  Will continue current dose Mounjaro  for now.  Recheck A1c in 6 months at her CPE.  Bunion of left foot Patient with small bunion noted on left foot.  Occasionally gets some burning and irritation distal of this which is likely due to compressive neuropathy.  No red flags or other abnormalities on exam.  Overall symptoms are manageable.  We did discuss referral to podiatry for further management however she declined.  We discussed reasons to return to care.  She will let us  know if she changes her mind about referral.  Hypertension associated with diabetes (HCC) Blood pressure at goal today on amlodipine  10 mg daily and lisinopril -HCTZ 20-25 once daily.     Subjective:  HPI:  See assessment / plan for status of chronic conditions.    Discussed the use of AI scribe software for clinical note transcription with the patient, who gave verbal consent to proceed.  History of Present Illness Erika Slaby is a 74 year old female who presents with a burning sensation in her big toe.  She experiences a burning sensation in her right big toe that began around November, coinciding with her birthday. The sensation is described as 'burny' and is not associated with any visible rash or swelling. It is localized to the big toe and does not radiate to other areas. She initially thought it might be related to arthritis or a bunion on the same side.  She has a history of a bunion on the right foot, which  occasionally swells. The bunion on the right foot does not cause significant discomfort, and she has stopped wearing high heels to mitigate any potential issues. The burning sensation is more noticeable when wearing certain shoes, particularly after attending church where she wears slight heels.  Her family history includes diabetes, which raises her concern about diabetic neuropathy, and her A1c is currently 5.0. She is currently on Mounjaro , which she transitioned to from Ozempic , and has experienced a weight loss of about twelve pounds since the last visit. She reports a decreased appetite, which she attributes to age and the medication.  She has experienced issues with her pharmacy, [facility], regarding the timely filling of her Mounjaro  prescription, often receiving only a one-month supply instead of the prescribed ninety days. This has caused concern about maintaining her medication schedule.         Objective:  Physical Exam: BP 120/68   Pulse 86   Temp (!) 97.4 F (36.3 C) (Temporal)   Ht 5' 4 (1.626 m)   Wt 155 lb (70.3 kg)   LMP 05/06/1999   SpO2 99%   BMI 26.61 kg/m   Wt Readings from Last 3 Encounters:  05/20/24 155 lb (70.3 kg)  02/02/24 157 lb (71.2 kg)  11/17/23 167 lb (75.8 kg)    Gen: No acute distress, resting comfortably CV: Regular rate and rhythm with no murmurs appreciated Pulm: Normal work of breathing, clear to auscultation bilaterally with no crackles, wheezes, or rhonchi MUSCULOSKELETAL: - Left foot:  Bunion noted.  No rashes or erythema.  No other deformities noted.  Neurovascular intact distally. Neuro: Grossly normal, moves all extremities Psych: Normal affect and thought content      Pakou Rainbow M. Kennyth, MD 05/20/2024 9:24 AM  "

## 2024-05-21 ENCOUNTER — Other Ambulatory Visit: Payer: Self-pay | Admitting: Family Medicine

## 2024-05-21 DIAGNOSIS — E782 Mixed hyperlipidemia: Secondary | ICD-10-CM

## 2024-11-17 ENCOUNTER — Encounter: Admitting: Family Medicine

## 2025-02-07 ENCOUNTER — Ambulatory Visit
